# Patient Record
Sex: Female | Born: 2000
Health system: Southern US, Community
[De-identification: ages and names within clinical notes are randomized; demographics above are authoritative.]

## PROBLEM LIST (undated history)

## (undated) DIAGNOSIS — K219 Gastro-esophageal reflux disease without esophagitis: Secondary | ICD-10-CM

## (undated) DIAGNOSIS — D561 Beta thalassemia: Secondary | ICD-10-CM

## (undated) DIAGNOSIS — T783XXA Angioneurotic edema, initial encounter: Secondary | ICD-10-CM

## (undated) DIAGNOSIS — F99 Mental disorder, not otherwise specified: Secondary | ICD-10-CM

## (undated) DIAGNOSIS — K589 Irritable bowel syndrome without diarrhea: Secondary | ICD-10-CM

## (undated) DIAGNOSIS — J45909 Unspecified asthma, uncomplicated: Secondary | ICD-10-CM

## (undated) DIAGNOSIS — IMO0001 Reserved for inherently not codable concepts without codable children: Secondary | ICD-10-CM

## (undated) DIAGNOSIS — T7840XA Allergy, unspecified, initial encounter: Secondary | ICD-10-CM

## (undated) DIAGNOSIS — L509 Urticaria, unspecified: Secondary | ICD-10-CM

## (undated) DIAGNOSIS — L309 Dermatitis, unspecified: Secondary | ICD-10-CM

## (undated) DIAGNOSIS — D563 Thalassemia minor: Secondary | ICD-10-CM

## (undated) DIAGNOSIS — G43909 Migraine, unspecified, not intractable, without status migrainosus: Secondary | ICD-10-CM

## (undated) DIAGNOSIS — F419 Anxiety disorder, unspecified: Secondary | ICD-10-CM

## (undated) HISTORY — PX: CYST REMOVAL HAND: SHX6279

## (undated) HISTORY — DX: Anxiety disorder, unspecified: F41.9

## (undated) HISTORY — DX: Beta thalassemia: D56.1

## (undated) HISTORY — DX: Urticaria, unspecified: L50.9

## (undated) HISTORY — DX: Gastro-esophageal reflux disease without esophagitis: K21.9

## (undated) HISTORY — DX: Migraine, unspecified, not intractable, without status migrainosus: G43.909

## (undated) HISTORY — DX: Thalassemia minor: D56.3

## (undated) HISTORY — DX: Reserved for inherently not codable concepts without codable children: IMO0001

## (undated) HISTORY — DX: Angioneurotic edema, initial encounter: T78.3XXA

## (undated) HISTORY — DX: Mental disorder, not otherwise specified: F99

## (undated) HISTORY — DX: Allergy, unspecified, initial encounter: T78.40XA

## (undated) HISTORY — DX: Unspecified asthma, uncomplicated: J45.909

## (undated) HISTORY — DX: Dermatitis, unspecified: L30.9

---
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
## 2018-01-05 ENCOUNTER — Ambulatory Visit (HOSPITAL_COMMUNITY): Payer: BLUE CROSS/BLUE SHIELD | Admitting: Licensed Clinical Social Worker

## 2018-01-05 ENCOUNTER — Encounter (HOSPITAL_COMMUNITY): Payer: Self-pay | Admitting: Licensed Clinical Social Worker

## 2018-01-05 DIAGNOSIS — F411 Generalized anxiety disorder: Secondary | ICD-10-CM | POA: Diagnosis not present

## 2018-01-05 NOTE — Progress Notes (Signed)
Comprehensive Clinical Assessment (CCA) Note  01/05/2018 Renee Savage [id number]  Visit Diagnosis:      ICD-10-CM   1. Generalized anxiety disorder F41.1       CCA Part One  Part One has been completed on paper by the patient.  (See scanned document in Chart Review)  CCA Part Two A  Intake/Chief Complaint:  CCA Intake With Chief Complaint CCA Part Two Date: 01/05/18 CCA Part Two Time: 0819 Chief Complaint/Presenting Problem: Anxiety Patients Currently Reported Symptoms/Problems: Mood: Some difficulty with focus, overwhelmed with thoughts at times, irritable with family members (mother and father), wakes up through the night, episodes of tearfulness, feels overwhelmed,  Anxiety: worried, nervous, fearful, history of panic attacks,  worries about her knee cap, punishes herself when she feels like she has messed up  Collateral Involvement: None Individual's Strengths: Doctor, hospital, good at teaching, mediator at home, good speaking voice Individual's Preferences: Prefer independence/freedom, Prefers to talk/work out her problems, Prefers to spend time with dogs, Doesn't prefer when family overwhelms her, Doesn't prefer when her mother guilts her Individual's Abilities: Writing, communication, teaching Type of Services Patient Feels Are Needed: Therapy, Medication management Initial Clinical Notes/Concerns: Symptoms started around age 10 when she was in middle school when she was bullied, symptoms occur 4-7 days out of the week, symptoms are moderate to severe   Mental Health Symptoms Depression:  Depression: Tearfulness  Mania:     Anxiety:   Anxiety: Difficulty concentrating, Irritability, Sleep, Worrying, Tension, Restlessness, Fatigue  Psychosis:  Psychosis: N/A  Trauma:  Trauma: N/A  Obsessions:  Obsessions: N/A  Compulsions:  Compulsions: N/A  Inattention:  Inattention: N/A  Hyperactivity/Impulsivity:  Hyperactivity/Impulsivity: N/A  Oppositional/Defiant Behaviors:   Oppositional/Defiant Behaviors: N/A  Borderline Personality:  Emotional Irregularity: N/A  Other Mood/Personality Symptoms:   N/A   Mental Status Exam Appearance and self-care  Stature:  Stature: Average  Weight:  Weight: Thin  Clothing:  Clothing: Casual  Grooming:  Grooming: Well-groomed  Cosmetic use:  Cosmetic Use: Age appropriate  Posture/gait:  Posture/Gait: Normal  Motor activity:  Motor Activity: Not Remarkable  Sensorium  Attention:  Attention: Normal  Concentration:  Concentration: Normal  Orientation:  Orientation: X5  Recall/memory:  Recall/Memory: Normal  Affect and Mood  Affect:  Affect: Anxious  Mood:  Mood: Anxious  Relating  Eye contact:  Eye Contact: Fleeting  Facial expression:  Facial Expression: Anxious  Attitude toward examiner:  Attitude Toward Examiner: Cooperative  Thought and Language  Speech flow: Speech Flow: Pressured  Thought content:  Thought Content: Appropriate to mood and circumstances  Preoccupation:  Preoccupations: (None)  Hallucinations:  Hallucinations: (None)  Organization:   Logical   Company secretary of Knowledge:  Fund of Knowledge: Average  Intelligence:  Intelligence: Average  Abstraction:  Abstraction: Normal  Judgement:  Judgement: Normal  Reality Testing:  Reality Testing: Adequate  Insight:  Insight: Good  Decision Making:  Decision Making: Normal  Social Functioning  Social Maturity:  Social Maturity: Responsible  Social Judgement:  Social Judgement: Normal  Stress  Stressors:  Stressors: Transitions  Coping Ability:  Coping Ability: Building surveyor Deficits:   Anxiety, stress  Supports:   Family    Family and Psychosocial History: Family history Marital status: Single Are you sexually active?: No What is your sexual orientation?: A sexual  Has your sexual activity been affected by drugs, alcohol, medication, or emotional stress?: None  Does patient have children?: No  Childhood History:  Childhood  History By whom was/is  the patient raised?: Both parents Additional childhood history information: Patient describes her childhood as good but stressful Description of patient's relationship with caregiver when they were a child: Mother: Good but overprotective,    Father: Ok  Patient's description of current relationship with people who raised him/her: Mother: Close but overprotective   Father: Ok relationship, he is controlling How were you disciplined when you got in trouble as a child/adolescent?: Spanked, things taken away  Does patient have siblings?: Yes Number of Siblings: 1 Description of patient's current relationship with siblings: Brother, close  Did patient suffer any verbal/emotional/physical/sexual abuse as a child?: No Did patient suffer from severe childhood neglect?: No Has patient ever been sexually abused/assaulted/raped as an adolescent or adult?: No Was the patient ever a victim of a crime or a disaster?: No Witnessed domestic violence?: No Has patient been effected by domestic violence as an adult?: No  CCA Part Two B  Employment/Work Situation: Employment / Work Psychologist, occupational Employment situation: Tax inspector is the longest time patient has a held a job?: N/A Where was the patient employed at that time?: N/A Did You Receive Any Psychiatric Treatment/Services While in Equities trader?: No Are There Guns or Other Weapons in Your Home?: No Are These Comptroller?: No  Education: Engineer, civil (consulting) Currently Attending: [employer] Last Grade Completed: 12 Name of High School: [employer]  Did Garment/textile technologist From [employer]?: Yes Did Theme park manager?: Yes What Type of College Degree Do you Have?: Pursuing a Chief Operating Officer  What Was Your Major?: English then Education Did You Have Any Scientist, research (life sciences) In School?: Theater, dance, creative writing  Did You Have An Individualized Education Program (IIEP): No Did You Have Any Difficulty At School?:  No  Religion: Religion/Spirituality Are You A Religious Person?: Yes What is Your Religious Affiliation?: Christian How Might This Affect Treatment?: Support in treatment  Leisure/Recreation: Leisure / Recreation Leisure and Hobbies: Writing, drawing  Exercise/Diet: Exercise/Diet Do You Exercise?: No Have You Gained or Lost A Significant Amount of Weight in the Past Six Months?: No Do You Follow a Special Diet?: No Do You Have Any Trouble Sleeping?: Yes Explanation of Sleeping Difficulties: Thoughts   CCA Part Two C  Alcohol/Drug Use: Alcohol / Drug Use Pain Medications: See Patient MAR Prescriptions: See patient MAR Over the Counter: See patient MAR History of alcohol / drug use?: No history of alcohol / drug abuse                      CCA Part Three  ASAM's:  Six Dimensions of Multidimensional Assessment  Dimension 1:  Acute Intoxication and/or Withdrawal Potential:  Dimension 1:  Comments: None  Dimension 2:  Biomedical Conditions and Complications:  Dimension 2:  Comments: None  Dimension 3:  Emotional, Behavioral, or Cognitive Conditions and Complications:  Dimension 3:  Comments: None  Dimension 4:  Readiness to Change:  Dimension 4:  Comments: None  Dimension 5:  Relapse, Continued use, or Continued Problem Potential:  Dimension 5:  Comments: None  Dimension 6:  Recovery/Living Environment:  Dimension 6:  Recovery/Living Environment Comments: None    Substance use Disorder (SUD)    Social Function:  Social Functioning Social Maturity: Responsible Social Judgement: Normal  Stress:  Stress Stressors: Transitions Coping Ability: Overwhelmed Patient Takes Medications The Way The Doctor Instructed?: Yes Priority Risk: Low Acuity  Risk Assessment- Self-Harm Potential: Risk Assessment For Self-Harm Potential Thoughts of Self-Harm: No current thoughts Method: No plan Availability of  Means: No access/NA  Risk Assessment -Dangerous to Others  Potential: Risk Assessment For Dangerous to Others Potential Method: No Plan Availability of Means: No access or NA Intent: Vague intent or NA Notification Required: No need or identified person  DSM5 Diagnoses: Patient Active Problem List   Diagnosis Date Noted  . Generalized anxiety disorder 11/09/2017  . Chronic abdominal pain 10/16/2017    Patient Centered Plan: Patient is on the following Treatment Plan(s):  Anxiety  Recommendations for Services/Supports/Treatments: Recommendations for Services/Supports/Treatments Recommendations For Services/Supports/Treatments: Individual Therapy, Medication Management  Treatment Plan Summary:  Treatment plan will be completed during next session related to her anxiety.   Referrals to Alternative Service(s): Referred to Alternative Service(s):   Place:   Date:   Time:    Referred to Alternative Service(s):   Place:   Date:   Time:    Referred to Alternative Service(s):   Place:   Date:   Time:    Referred to Alternative Service(s):   Place:   Date:   Time:     Bynum Bellows, LCSW

## 2018-01-11 ENCOUNTER — Encounter (HOSPITAL_COMMUNITY): Payer: Self-pay | Admitting: Psychiatry

## 2018-01-11 ENCOUNTER — Ambulatory Visit (INDEPENDENT_AMBULATORY_CARE_PROVIDER_SITE_OTHER): Payer: BLUE CROSS/BLUE SHIELD | Admitting: Psychiatry

## 2018-01-11 VITALS — BP 100/68 | HR 86 | Ht 62.0 in | Wt 102.0 lb

## 2018-01-11 DIAGNOSIS — F411 Generalized anxiety disorder: Secondary | ICD-10-CM

## 2018-01-11 MED ORDER — ESCITALOPRAM OXALATE 20 MG PO TABS: 20.0000 mg | ORAL_TABLET | Freq: Every day | ORAL | 2 refills | Status: DC

## 2018-01-11 NOTE — Progress Notes (Signed)
BH MD/PA/NP OP Progress Note  01/11/2018 1:33 PM Renee Savage  MRN:  [id number]  Chief Complaint:  Chief Complaint    Depression; Anxiety; Follow-up     HPI: This patient is a 17 year old single white female who lives at home with her parents and 90 year old brother. 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  The patient was referred by her primary physician, [name] for further assessment and treatment of anxiety.  The patient states that she has been anxious for a number of years. Some of this started in middle school because she was being bullied. She also has a very contentious relationship with her father. She states that he tries to control everyone in the family but particularly her. They are always butting heads. She states that he comes from a "military family "and tends to be harsh. He is very critical of her appearance, her relationships, her major in school. He is also constantly criticizing little things like the timing of when she pays her tuition, how she does things around the house etc. By her report he is very harsh and judgmental. He pushed her constantly to go into the same feel that he is-speech therapy even though she does not want to. He is very against her living on campus because of the cost.  The patient states over the last few months she has been extremely irritable, crying at the drop of a hat anxious and worried. She tends to obsess about things and expect the worst. She is very self-critical. She was dating a boy last fall and he broke up with her in December and this caused some of the symptoms to intensify. She has never had previous psychiatric treatment because her father does not believe in mental health treatment. She does have a lot of GI issues such as stomachaches and diarrhea which Dr. [name] thinks are related to stress. She is not  able to sleep well and is constantly wakes up through the night. She has had some panic attacks particularly during arguments with dad. She denies being suicidal or engaging in any form of self-harm. She does not use alcohol drugs cigarettes or vaping. She is never been sexually active. She and her mother are very close and she is also close to her brother. She seems extremely managed in the family, trying to protect her brother from the father and also trying to make up for her mother's chronic headaches by being a "second mom"to brother who picks him up at school etc.  The patient returns after 4 weeks.  She is now on Lexapro 20 mg daily.  She claims she is somewhat less anxious but is not speaking in a very tangential way about all the issues she is having with her family.  She seems to be handling them somewhat better.  She still not very good at setting limits but is in therapy here.  She states that her mother is always asking her to do things that she does not feel up to doing.  She states that her brother is better at setting limits and she is.  She is constantly having diarrhea and are trying to figure out why.  She was found not to have gluten sensitivity.  She thinks it may be due to stress.  She has stopped taking Zantac due to recent recalls but has not substituted something else and I suggested  she call Dr. [name] about this. Visit Diagnosis:    ICD-10-CM   1. Generalized anxiety disorder F41.1     Past Psychiatric History: none  Past Medical History:  Past Medical History:  Diagnosis Date  . Allergy    Chronic  . Angio-edema   . Anxiety   . Asthma   . Beta thalassemia (HCC)   . Eczema   . IBS (irritable bowel syndrome)   . Reflux   . Urticaria     Past Surgical History:  Procedure Laterality Date  . CYST REMOVAL HAND    . WISDOM TOOTH EXTRACTION  2016    Family Psychiatric History: See below  Family History:  Family History  Problem Relation Age of Onset  .  Anxiety disorder Mother   . Depression Mother   . Depression Father   . Anxiety disorder Brother   . Depression Brother   . Depression Paternal Aunt   . Bipolar disorder Other     Social History:  Social History   Socioeconomic History  . Marital status: Single    Spouse name: Not on file  . Number of children: Not on file  . Years of education: Not on file  . Highest education level: Not on file  Occupational History  . Not on file  Social Needs  . Financial resource strain: Not on file  . Food insecurity:    Worry: Not on file    Inability: Not on file  . Transportation needs:    Medical: Not on file    Non-medical: Not on file  Tobacco Use  . Smoking status: Never Smoker  . Smokeless tobacco: Never Used  Substance and Sexual Activity  . Alcohol use: No  . Drug use: No  . Sexual activity: Never  Lifestyle  . Physical activity:    Days per week: Not on file    Minutes per session: Not on file  . Stress: Not on file  Relationships  . Social connections:    Talks on phone: Not on file    Gets together: Not on file    Attends religious service: Not on file    Active member of club or organization: Not on file    Attends meetings of clubs or organizations: Not on file    Relationship status: Not on file  Other Topics Concern  . Not on file  Social History Narrative  . Not on file    Allergies:  Allergies  Allergen Reactions  . Cefzil [Cefprozil] Nausea And Vomiting    Vomiting (as an baby)  . Gluten Meal   . Trimox [Amoxicillin] Nausea And Vomiting    Vomiting (as an baby)    Metabolic Disorder Labs: No results found for: HGBA1C, MPG No results found for: PROLACTIN No results found for: CHOL, TRIG, HDL, CHOLHDL, VLDL, LDLCALC No results found for: TSH  Therapeutic Level Labs: No results found for: LITHIUM No results found for: VALPROATE No components found for:  CBMZ  Current Medications: Current Outpatient Medications  Medication Sig Dispense  Refill  . diphenhydrAMINE HCl (BENADRYL ALLERGY PO) Take by mouth.    . escitalopram (LEXAPRO) 20 MG tablet Take 1 tablet (20 mg total) by mouth daily. 30 tablet 2  . Fexofenadine HCl (ALLEGRA ALLERGY PO) Take by mouth.    . fluticasone (FLONASE) 50 MCG/ACT nasal spray Place into both nostrils daily.    . Melatonin 10 MG TABS Take by mouth at bedtime as needed.    . montelukast (SINGULAIR)  10 MG tablet Take 1 tablet (10 mg total) by mouth at bedtime. 30 tablet 5  . Multiple Vitamin (MULTIVITAMIN) tablet Take 2 tablets by mouth at bedtime.     . Norethindrone-Ethinyl Estradiol-Fe Biphas (LO LOESTRIN FE) 1 MG-10 MCG / 10 MCG tablet Take 1 tablet by mouth daily. 1 Package 11  . Probiotic Product (PROBIOTIC DAILY PO) Take by mouth.    . ranitidine (ZANTAC) 150 MG tablet Take 1 tablet (150 mg total) by mouth 2 (two) times daily. For 14 days 28 tablet 0   No current facility-administered medications for this visit.      Musculoskeletal: Strength & Muscle Tone: within normal limits Gait & Station: normal Patient leans: N/A  Psychiatric Specialty Exam: Review of Systems  Gastrointestinal: Positive for diarrhea and heartburn.  Psychiatric/Behavioral: The patient is nervous/anxious.   All other systems reviewed and are negative.   Blood pressure 100/68, pulse 86, height 5\' 2"  (1.575 m), weight 102 lb (46.3 kg), SpO2 100 %.Body mass index is 18.66 kg/m.  General Appearance: Casual and Fairly Groomed  Eye Contact:  Good  Speech:  Clear and Coherent  Volume:  Normal  Mood:  Anxious  Affect:  Congruent and Full Range  Thought Process:  Goal Directed  Orientation:  Full (Time, Place, and Person)  Thought Content: Rumination   Suicidal Thoughts:  No  Homicidal Thoughts:  No  Memory:  Immediate;   Good Recent;   Good Remote;   Good  Judgement:  Fair  Insight:  Lacking  Psychomotor Activity:  Restlessness  Concentration:  Concentration: Good and Attention Span: Good  Recall:  Good  Fund  of Knowledge: Good  Language: Good  Akathisia:  No  Handed:  Right  AIMS (if indicated): not done  Assets:  Communication Skills Desire for Improvement Physical Health Resilience Social Support Talents/Skills  ADL's:  Intact  Cognition: WNL  Sleep:  Fair   Screenings: GAD-7     Office Visit from 10/16/2017 in [facility]  Total GAD-7 Score  16       Assessment and Plan: Patient is a 17 year old female with a history of generalized anxiety disorder.  She is still somewhat anxious but seems to be doing a bit better and is more animated and talkative.  She will continue Lexapro 20 mg daily and continue her therapy here.  She will return to see me in 6 weeks   [name], MD 01/11/2018, 1:33 PM

## 2018-01-13 ENCOUNTER — Ambulatory Visit (HOSPITAL_COMMUNITY): Payer: BLUE CROSS/BLUE SHIELD | Admitting: Licensed Clinical Social Worker

## 2018-01-13 DIAGNOSIS — F411 Generalized anxiety disorder: Secondary | ICD-10-CM | POA: Diagnosis not present

## 2018-01-14 ENCOUNTER — Encounter (HOSPITAL_COMMUNITY): Payer: Self-pay | Admitting: Licensed Clinical Social Worker

## 2018-01-14 NOTE — Progress Notes (Addendum)
   THERAPIST PROGRESS NOTE  Session Time: 1:00 pm- 2:00 pm  Participation Level: Active  Behavioral Response: CasualAlertAnxious  Type of Therapy: Individual Therapy  Treatment Goals addressed: Coping  Interventions: CBT and Solution Focused  Summary: Renee Savage is a 17 y.o. female who presents oriented x5 (person, place, situation, time, and object), casually dressed, appropriately groomed, average height, slender, and cooperative to address anxiety. Patient has a  History of medical treatment including diet for what was thought to be a gluten intolerance, GI issues and cyst on her hand. Patient has a history of mental health treatment including medication management. Patient denies suicidal and homicidal ideations. Patient denies substance abuse. Patient is at low risk for lethality at this time.  Patient has been having GI issues which she thinks is related to stress. She has 3 midterms at the end of the week in classes that she is no particularly passionate about. Patient feels like her parents forced her to switch majors because her brother said that her writing made him suicidal. Patient feels immense pressure from her parents. She is expected to make straight A's and maintain the house due to mother's toe surgery. Patient is responsible for making her 68 year old brother's lunch. She feels like her parents baby her brother and she is forced to baby him too. Patient writes her thoughts out on her phone and she records voice memos on her phone. Her parents played her voice memos and patient was embarrassed that they invaded her privacy. She went to [employer] and [employer] between her college classes and felt like this was empowering her because she didn't tell her mother who would have been upset that she went alone to an "unsafe" area. After discussion, patient understood that she needs to set small boundaries with her parents by telling them "no" and to take small steps to gain her  independence such as going places she wants and continuing to write even though her parents have told her no.  Patient engaged in session. She responded well to interventions. Patient continues to meet criteria for Generalized Anxiety Disorder. Patient will continue in outpatient therapy due to being the least restrictive service to meet her needs at this time. Patient made minimal progress on her goals.   Suicidal/Homicidal: Negativewithout intent/plan  Therapist Response: Therapist reviewed patient's recent thoughts and behaviors. Therapist utilized CBT to address anxiety. Therapist completed treatment plan with patient. Therapist processed patient's feelings to identify triggers for anxiety. Therapist helped patient identify ways to set boundaries and increase her independence to reduce feeling overwhelmed by her parents.   Plan: Return again in 3-4 weeks.  Diagnosis: Axis I: Generalized Anxiety Disorder    Axis II: No diagnosis    Bynum Bellows, LCSW 01/14/2018

## 2018-02-10 ENCOUNTER — Telehealth: Payer: Self-pay | Admitting: Family Medicine

## 2018-02-10 NOTE — Telephone Encounter (Signed)
Pt's school is having an outbreak of the mumps, pt would like to know if she is up to date on her shots. Advise.

## 2018-02-10 NOTE — Telephone Encounter (Signed)
Patient is aware she has had her mmr's.

## 2018-02-16 ENCOUNTER — Ambulatory Visit: Payer: BLUE CROSS/BLUE SHIELD | Admitting: Allergy & Immunology

## 2018-02-18 ENCOUNTER — Encounter: Payer: Self-pay | Admitting: Allergy & Immunology

## 2018-02-18 ENCOUNTER — Ambulatory Visit: Payer: Managed Care, Other (non HMO) | Admitting: Allergy & Immunology

## 2018-02-18 VITALS — BP 108/72 | HR 76 | Resp 16

## 2018-02-18 DIAGNOSIS — L501 Idiopathic urticaria: Secondary | ICD-10-CM

## 2018-02-18 DIAGNOSIS — F419 Anxiety disorder, unspecified: Secondary | ICD-10-CM | POA: Diagnosis not present

## 2018-02-18 NOTE — Patient Instructions (Addendum)
1. Chronic urticaria with angioedema (hives/swelling) - with negative workup - It seems that you have gotten to the root of your hive problems.  - Continue with antihistamines as needed.   2. Return if symptoms worsen or fail to improve.   Please inform us of any Emergency Department visits, hospitalizations, or changes in symptoms. Call us before going to the ED for breathing or allergy symptoms since we might be able to fit you in for a sick visit. Feel free to contact us anytime with any questions, problems, or concerns.  It was a pleasure to see you again today!  Websites that have reliable patient information: 1. American Academy of Asthma, Allergy, and Immunology: www.aaaai.org 2. Food Allergy Research and Education (FARE): foodallergy.org 3. Mothers of Asthmatics: http://www.asthmacommunitynetwork.org 4. American College of Allergy, Asthma, and Immunology: MissingWeapons.cawww.acaai.org   Make sure you are registered to vote! If you have moved or changed any of your contact information, you will need to get this updated before voting!

## 2018-02-18 NOTE — Progress Notes (Signed)
FOLLOW UP  Date of Service/Encounter:  02/18/18   Assessment:   Chronic idiopathic urticaria  Anxiety   Adverse social situation  Plan/Recommendations:   1. Chronic urticaria with angioedema (hives/swelling) - with negative workup - It seems that you have gotten to the root of your hive problems.  - Continue with antihistamines as needed.   2. Return if symptoms worsen or fail to improve.   Subjective:   Renee Savage is a 17 y.o. female presenting today for follow up of  Chief Complaint  Patient presents with  . Urticaria    doing better    Renee Savage has a history of the following: Patient Active Problem List   Diagnosis Date Noted  . Generalized anxiety disorder 11/09/2017  . Chronic abdominal pain 10/16/2017    History obtained from: chart review and patient.  Renee Savage's Primary Care Provider is Mikey Kirschner, MD.     Renee Savage is a 17 y.o. female presenting for a follow up visit.  She was last seen in August 2019 for an evaluation of chronic urticaria with angioedema.  We did get some labs to rule out serious causes of hives and swelling.  The only thing that was positive for the H. pylori IgM, which evidently is not predictive of an H. pylori infection.  We did follow-up with a breath test which was negative. We started her on suppressive doses of antihistamines including Allegra two tablets daily and Zyrtec 66m daily and Singulair 154mdaily. We did discuss Xolair as a treatment option as well.  She did have equivocal testing to orange, but was otherwise negative to the remainder of the full food panel.  Since the last visit, she has done well. She actually tried tapering them off in September. She feels that her hives are related to anxiety and she is now on a daily medication for anxiety. She does have counseling as well. This has seemed to control her hives.    She thinks that the stomach pain is related to the anxiety as well. This  overall has become more  severe, but she thinks that she has been able to control it more with the anxiety medications.   She underwent quite a bit of stress over the summer. Her brother attempted to commit suicide over the summer and was hospitalized for a period of time. 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 Her father seems like a rather controlling individual from what Renee Savage with me today. He tells Renee Savage of the time that she needs to choose a different career where she can make money and find a husband. Apparently, her father is rather demeaning to all of his children as well as Bailea's mother. In fact, her parents blamed her writing for her brother's attempt at suicide. In response to that, they deleted all of her novels and writing that she had done. This occurred over the summer prior to when I first met her. However, at the last visit Renee Savage's mother was with her and she was not able to fully express herself.  Renee Savage takes care of things around the house, including cleaning and taking her brother and mother to places. Renee Savage's father works long hours as a spPsychologist, clinicalt [place] a sore subject  for her but she is finding her ways to deal with it.   Otherwise, there have been no changes to her past medical history, surgical history, family history, or social history.    Review of Systems: a 14-point review of systems is pertinent for what is mentioned in HPI.  Otherwise, all other systems were negative.  Constitutional: negative other than that listed in the HPI Eyes: negative other than that listed in the HPI Ears, nose, mouth, throat, and face: negative other than that listed in the HPI Respiratory: negative other than that listed in the HPI Cardiovascular:  negative other than that listed in the HPI Gastrointestinal: negative other than that listed in the HPI Genitourinary: negative other than that listed in the HPI Integument: negative other than that listed in the HPI Hematologic: negative other than that listed in the HPI Musculoskeletal: negative other than that listed in the HPI Neurological: negative other than that listed in the HPI Allergy/Immunologic: negative other than that listed in the HPI    Objective:   Blood pressure 108/72, pulse 76, resp. rate 16. There is no height or weight on file to calculate BMI.   Physical Exam:  General: Alert, interactive, in no acute distress. Very heated at times when she is talking about her family.  Eyes: No conjunctival injection bilaterally, no discharge on the right, no discharge on the left and no Horner-Trantas dots present. PERRL bilaterally. EOMI without pain. No photophobia.  Ears: Right TM pearly gray with normal light reflex, Left TM pearly gray with normal light reflex, Right TM intact without perforation and Left TM intact without perforation.  Nose/Throat: External nose within normal limits and septum midline. Turbinates edematous and pale with clear discharge. Posterior oropharynx erythematous without cobblestoning in the posterior oropharynx. Tonsils 2+ without exudates.  Tongue without thrush. Lungs: Clear to auscultation without wheezing, rhonchi or rales. No increased work of breathing. CV: Normal S1/S2. No murmurs. Capillary refill <2 seconds.  Skin: Warm and dry, without lesions or rashes. Neuro:   Grossly intact. No focal deficits appreciated. Responsive to questions.  Diagnostic studies: none      Renee Marvel, MD  [facility]

## 2018-02-23 ENCOUNTER — Encounter (HOSPITAL_COMMUNITY): Payer: Self-pay | Admitting: Psychiatry

## 2018-02-23 ENCOUNTER — Ambulatory Visit (INDEPENDENT_AMBULATORY_CARE_PROVIDER_SITE_OTHER): Payer: PRIVATE HEALTH INSURANCE | Admitting: Psychiatry

## 2018-02-23 VITALS — BP 109/78 | HR 87 | Ht 62.0 in | Wt 109.0 lb

## 2018-02-23 DIAGNOSIS — F411 Generalized anxiety disorder: Secondary | ICD-10-CM

## 2018-02-23 MED ORDER — ESCITALOPRAM OXALATE 20 MG PO TABS: 20.0000 mg | ORAL_TABLET | Freq: Every day | ORAL | 2 refills | Status: DC

## 2018-02-23 NOTE — Progress Notes (Signed)
BH MD/PA/NP OP Progress Note  02/23/2018 8:56 AM Renee BasemanCatherine A Savage  MRN:  [id number]  Chief Complaint:  Chief Complaint    Depression; Anxiety; Follow-up     HPI:  This patient is a 17 year old single white female who lives at home with her parents and 19 year old brother. 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  The patient was referred by her primary physician, Dr. Lilyan PuntScott Luking for further assessment and treatment of anxiety.  The patient states that she has been anxious for a number of years. Some of this started in middle school because she was being bullied. She also has a very contentious relationship with her father. She states that he tries to control everyone in the family but particularly her. They are always butting heads. She states that he comes from a "military family "and tends to be harsh. He is very critical of her appearance, her relationships, her major in school. He is also constantly criticizing little things like the timing of when she pays her tuition, how she does things around the house etc. By her report he is very harsh and judgmental. He pushed her constantly to go into the same feel that he is-speech therapy even though she does not want to. He is very against her living on campus because of the cost.  The patient states over the last few months she has been extremely irritable, crying at the drop of a hat anxious and worried. She tends to obsess about things and expect the worst. She is very self-critical. She was dating a boy last fall and he broke up with her in December and this caused some of the symptoms to intensify. She has never had previous psychiatric treatment because her father does not believe in mental health treatment. She does have a lot of GI issues such as stomachaches and diarrhea which Dr. Gerda DissLuking thinks are related to stress. She is not  able to sleep well and is constantly wakes up through the night. She has had some panic attacks particularly during arguments with dad. She denies being suicidal or engaging in any form of self-harm. She does not use alcohol drugs cigarettes or vaping. She is never been sexually active. She and her mother are very close and she is also close to her brother. She seems extremely managed in the family, trying to protect her brother from the father and also trying to make up for her mother's chronic headaches by being a "second mom"to brother who picks him up at school etc.  The patient returns after 1 month.  She is generally doing quite well on her courses.  She is nervous today because she has to do a presentation.  Overall however her anxiety has lessened and she is sleeping well.  Her energy is good and she denies suicidal ideation.  She is no longer having panic attacks.  She is very worried about telling her parents that she is about to publish the children's [employer] book.  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  I encouraged her to make her point to her parents as she is now an adult and is able to make her own decisions about such things.   ICD-10-CM   1. Generalized anxiety disorder F41.1     Past Psychiatric History:none  Past  Medical History:  Past Medical History:  Diagnosis Date  . Allergy    Chronic  . Angio-edema   . Anxiety   . Asthma   . Beta thalassemia (HCC)   . Eczema   . IBS (irritable bowel syndrome)   . Reflux   . Urticaria     Past Surgical History:  Procedure Laterality Date  . CYST REMOVAL HAND    . WISDOM TOOTH EXTRACTION  2016    Family Psychiatric History: See below  Family History:  Family History  Problem Relation Age of Onset  . Anxiety disorder Mother   . Depression Mother   . Depression Father   . Anxiety disorder Brother   . Depression Brother   . Depression  Paternal Aunt   . Bipolar disorder Other     Social History:  Social History   Socioeconomic History  . Marital status: Single    Spouse name: Not on file  . Number of children: Not on file  . Years of education: Not on file  . Highest education level: Not on file  Occupational History  . Not on file  Social Needs  . Financial resource strain: Not on file  . Food insecurity:    Worry: Not on file    Inability: Not on file  . Transportation needs:    Medical: Not on file    Non-medical: Not on file  Tobacco Use  . Smoking status: Never Smoker  . Smokeless tobacco: Never Used  Substance and Sexual Activity  . Alcohol use: No  . Drug use: No  . Sexual activity: Never  Lifestyle  . Physical activity:    Days per week: Not on file    Minutes per session: Not on file  . Stress: Not on file  Relationships  . Social connections:    Talks on phone: Not on file    Gets together: Not on file    Attends religious service: Not on file    Active member of club or organization: Not on file    Attends meetings of clubs or organizations: Not on file    Relationship status: Not on file  Other Topics Concern  . Not on file  Social History Narrative  . Not on file    Allergies:  Allergies  Allergen Reactions  . Cefzil [Cefprozil] Nausea And Vomiting    Vomiting (as an baby)  . Gluten Meal   . Trimox [Amoxicillin] Nausea And Vomiting    Vomiting (as an baby)    Metabolic Disorder Labs: No results found for: HGBA1C, MPG No results found for: PROLACTIN No results found for: CHOL, TRIG, HDL, CHOLHDL, VLDL, LDLCALC No results found for: TSH  Therapeutic Level Labs: No results found for: LITHIUM No results found for: VALPROATE No components found for:  CBMZ  Current Medications: Current Outpatient Medications  Medication Sig Dispense Refill  . diphenhydrAMINE HCl (BENADRYL ALLERGY PO) Take by mouth.    . escitalopram (LEXAPRO) 20 MG tablet Take 1 tablet (20 mg total)  by mouth daily. 30 tablet 2  . Fexofenadine HCl (ALLEGRA ALLERGY PO) Take by mouth.    . fluticasone (FLONASE) 50 MCG/ACT nasal spray Place into both nostrils daily.    . Melatonin 10 MG TABS Take by mouth at bedtime as needed.    . montelukast (SINGULAIR) 10 MG tablet Take 1 tablet (10 mg total) by mouth at bedtime. 30 tablet 5  . Multiple Vitamin (MULTIVITAMIN) tablet Take 2 tablets by mouth at bedtime.     Marland Kitchen  Norethindrone-Ethinyl Estradiol-Fe Biphas (LO LOESTRIN FE) 1 MG-10 MCG / 10 MCG tablet Take 1 tablet by mouth daily. 1 Package 11  . Probiotic Product (PROBIOTIC DAILY PO) Take by mouth.     No current facility-administered medications for this visit.      Musculoskeletal: Strength & Muscle Tone: within normal limits Gait & Station: normal Patient leans: N/A  Psychiatric Specialty Exam: Review of Systems  Psychiatric/Behavioral: The patient is nervous/anxious.   All other systems reviewed and are negative.   Blood pressure 109/78, pulse 87, height 5\' 2"  (1.575 m), weight 109 lb (49.4 kg), SpO2 100 %.Body mass index is 19.94 kg/m.  General Appearance: Casual and Fairly Groomed  Eye Contact:  Good  Speech:  Clear and Coherent  Volume:  Normal  Mood:  Anxious  Affect:  Appropriate and Congruent  Thought Process:  Goal Directed  Orientation:  Full (Time, Place, and Person)  Thought Content: Rumination   Suicidal Thoughts:  No  Homicidal Thoughts:  No  Memory:  Immediate;   Good Recent;   Good Remote;   Good  Judgement:  Good  Insight:  Fair  Psychomotor Activity:  Normal  Concentration:  Concentration: Good and Attention Span: Good  Recall:  Good  Fund of Knowledge: Good  Language: Good  Akathisia:  No  Handed:  Right  AIMS (if indicated): not done  Assets:  Communication Skills Desire for Improvement Physical Health Resilience Social Support Talents/Skills  ADL's:  Intact  Cognition: WNL  Sleep:  Good   Screenings: GAD-7     Office Visit from 10/16/2017  in [facility]  Total GAD-7 Score  16       Assessment and Plan: This patient is a 17 year old female with a history of anxiety.  She has a lot of demands and expectations placed on her by her family at least by her report.  She is slowly trying to extricate herself from these demands and to make her own way in the world.  She has benefited from Lexapro 20 mg daily as she is less anxious and no longer having frequent panic attacks and is sleeping better.  She will continue this medication, continue her counseling here and return to see me in 2 months   [name], MD 02/23/2018, 8:56 AM

## 2018-03-02 ENCOUNTER — Encounter

## 2018-03-02 ENCOUNTER — Ambulatory Visit (INDEPENDENT_AMBULATORY_CARE_PROVIDER_SITE_OTHER): Payer: PRIVATE HEALTH INSURANCE | Admitting: Licensed Clinical Social Worker

## 2018-03-02 ENCOUNTER — Encounter (HOSPITAL_COMMUNITY): Payer: Self-pay | Admitting: Licensed Clinical Social Worker

## 2018-03-02 DIAGNOSIS — F411 Generalized anxiety disorder: Secondary | ICD-10-CM | POA: Diagnosis not present

## 2018-03-02 NOTE — Progress Notes (Signed)
   THERAPIST PROGRESS NOTE  Session Time: 8:00 am- 9:00 am  Participation Level: Active  Behavioral Response: CasualAlertAnxious  Type of Therapy: Individual Therapy  Treatment Goals addressed: Coping  Interventions: CBT and Solution Focused  Summary: Renee Savage is a 17 y.o. female who presents oriented x5 (person, place, situation, time, and object), casually dressed, appropriately groomed, average height, slender, and cooperative to address anxiety. Patient has a  History of medical treatment including diet for what was thought to be a gluten intolerance, GI issues and cyst on her hand. Patient has a history of mental health treatment including medication management. Patient denies suicidal and homicidal ideations. Patient denies substance abuse. Patient is at low risk for lethality at this time.  Patient didn't report any physical issues. Patient continues to be spiritually healthy. She is concerned with her parents. They are arguing a lot and it is causing anxiety. Patient has been writing and struggling in school. She is managing better at school. Patient has an opportunity to publish her book and she is really excited. Patient is also really worried about telling her parents. They have forbid her from writing. She is worried they are going to delete her story, etc. Patient is planning on talking to her parents on a "good day" when they are not arguing or fighting.   Patient engaged in session. She responded well to interventions. Patient continues to meet criteria for Generalized Anxiety Disorder. Patient will continue in outpatient therapy due to being the least restrictive service to meet her needs at this time. Patient made minimal progress on her goals.   Suicidal/Homicidal: Negativewithout intent/plan  Therapist Response: Therapist reviewed patient's recent thoughts and behaviors. Therapist utilized CBT to address anxiety. Therapist completed treatment plan with patient.  Therapist processed patient's feelings to identify triggers for anxiety. Therapist discussed patient talking to her family about getting her book published and continuing to gain independence.   Plan: Return again in 3-4 weeks.  Diagnosis: Axis I: Generalized Anxiety Disorder    Axis II: No diagnosis    Bynum BellowsJoshua Jenavieve Freda, LCSW 03/02/2018

## 2018-04-02 ENCOUNTER — Ambulatory Visit: Payer: Managed Care, Other (non HMO) | Admitting: Family Medicine

## 2018-04-02 ENCOUNTER — Encounter: Payer: Self-pay | Admitting: Family Medicine

## 2018-04-02 VITALS — Temp 99.3°F | Wt 115.4 lb

## 2018-04-02 DIAGNOSIS — J019 Acute sinusitis, unspecified: Secondary | ICD-10-CM | POA: Diagnosis not present

## 2018-04-02 DIAGNOSIS — R062 Wheezing: Secondary | ICD-10-CM | POA: Diagnosis not present

## 2018-04-02 DIAGNOSIS — B9689 Other specified bacterial agents as the cause of diseases classified elsewhere: Secondary | ICD-10-CM | POA: Diagnosis not present

## 2018-04-02 MED ORDER — DOXYCYCLINE HYCLATE 100 MG PO CAPS: 100.0000 mg | ORAL_CAPSULE | Freq: Two times a day (BID) | ORAL | 0 refills | Status: DC

## 2018-04-02 MED ORDER — ALBUTEROL SULFATE HFA 108 (90 BASE) MCG/ACT IN AERS: 2.0000 | INHALATION_SPRAY | Freq: Four times a day (QID) | RESPIRATORY_TRACT | 2 refills | Status: AC | PRN

## 2018-04-02 NOTE — Progress Notes (Signed)
   Subjective:    Patient ID: Renee Savage, female    DOB: [birth year], 17 y.o.   MRN: [id number]  Cough  This is a new problem. The current episode started in the past 7 days. Associated symptoms include a fever, rhinorrhea and wheezing. Pertinent negatives include no chest pain, ear pain or shortness of breath. Associated symptoms comments: Off and off fever started yesterday. Treatments tried: [place] Elderburry, Mucinex DM, Mucinex. The treatment provided no relief.   Under some stress in school because she had a B- in a class but otherwise doing well in school   Review of Systems  Constitutional: Positive for fever. Negative for activity change.  HENT: Positive for congestion and rhinorrhea. Negative for ear pain.   Eyes: Negative for discharge.  Respiratory: Positive for cough and wheezing. Negative for shortness of breath.   Cardiovascular: Negative for chest pain.   Experiences an occasional wheeze    Objective:   Physical Exam Vitals signs and nursing note reviewed.  Constitutional:      Appearance: She is well-developed.  HENT:     Head: Normocephalic.     Nose: Nose normal.     Mouth/Throat:     Pharynx: No oropharyngeal exudate.  Neck:     Musculoskeletal: Neck supple.  Cardiovascular:     Rate and Rhythm: Normal rate.     Heart sounds: Normal heart sounds. No murmur.  Pulmonary:     Effort: Pulmonary effort is normal.     Breath sounds: Normal breath sounds. No wheezing.  Lymphadenopathy:     Cervical: No cervical adenopathy.  Skin:    General: Skin is warm and dry.    Patient with frontal sinus tenderness Patient had a bloody nose but I do not see any active bleeding currently      Assessment & Plan:  Patient stressed about not doing well at school but denies being depressed we did talk about measures of trying her best but at the same time allowing herself some leeway in her approach  Secondary sinusitis antibiotic prescribed warning signs discussed  follow-up if progressive troubles or worse  Albuterol PRN no prednisone necessary currently

## 2018-04-09 ENCOUNTER — Other Ambulatory Visit (HOSPITAL_COMMUNITY): Payer: Self-pay | Admitting: Psychiatry

## 2018-04-09 MED ORDER — ESCITALOPRAM OXALATE 20 MG PO TABS: 20.0000 mg | ORAL_TABLET | Freq: Every day | ORAL | 2 refills | Status: DC

## 2018-04-20 ENCOUNTER — Encounter: Payer: Self-pay | Admitting: Family Medicine

## 2018-04-20 ENCOUNTER — Ambulatory Visit: Payer: Managed Care, Other (non HMO) | Admitting: Family Medicine

## 2018-04-20 VITALS — BP 112/80 | Temp 98.7°F | Wt 118.0 lb

## 2018-04-20 DIAGNOSIS — J019 Acute sinusitis, unspecified: Secondary | ICD-10-CM | POA: Diagnosis not present

## 2018-04-20 MED ORDER — SULFAMETHOXAZOLE-TRIMETHOPRIM 800-160 MG PO TABS: 1.0000 | ORAL_TABLET | Freq: Two times a day (BID) | ORAL | 0 refills | Status: AC

## 2018-04-20 NOTE — Progress Notes (Signed)
   Subjective:    Patient ID: Renee Savage, female    DOB: Aug 05, 2000, 18 y.o.   MRN: [id number]  HPI  Patient is here today with complaints of a cough,runny nose,fever,fatigue,swollen glands. She says this all began Dec 25,2019. She states she has taken mucinex,pineapple juice. Was seen Dec 27,2019 and was given doxycycline. She state it helped,but she has relapsed.  Felt a little better after abx for about 3 days still had cough but noticed it was less severe and having less nasal drainage, started back with worsening symptoms on 1/9 with more congestion, cough productive of green/yellow mucous, low grade fever. Reports feeling very tired. Bodyaches, especially around her neck and legs.    Review of Systems  Constitutional: Positive for chills, fatigue and fever.  HENT: Positive for congestion, rhinorrhea and sore throat. Negative for ear pain.   Respiratory: Positive for cough. Negative for shortness of breath and wheezing.   Musculoskeletal: Positive for myalgias.       Objective:   Physical Exam Vitals signs and nursing note reviewed.  Constitutional:      General: She is not in acute distress.    Appearance: Normal appearance. She is not toxic-appearing.  HENT:     Head: Normocephalic and atraumatic.     Right Ear: Tympanic membrane normal.     Left Ear: Tympanic membrane normal.     Nose: Nasal tenderness and congestion present.     Mouth/Throat:     Mouth: Mucous membranes are moist.     Pharynx: Oropharynx is clear.  Eyes:     General:        Right eye: No discharge.        Left eye: No discharge.  Neck:     Musculoskeletal: Neck supple. No neck rigidity.  Cardiovascular:     Rate and Rhythm: Normal rate and regular rhythm.     Heart sounds: Normal heart sounds.  Pulmonary:     Effort: Pulmonary effort is normal. No respiratory distress.     Breath sounds: Normal breath sounds. No wheezing or rales.  Lymphadenopathy:     Cervical: No cervical adenopathy.    Skin:    General: Skin is warm and dry.  Neurological:     Mental Status: She is alert and oriented to person, place, and time.           Assessment & Plan:  Acute rhinosinusitis  Discussed with patient likely post flu secondary bacterial infection.  Will treat with different antibiotic than before.  Symptomatic care discussed.  Warning signs discussed.  Follow-up if symptoms worsen or fail to improve.  Dr. Lubertha South was consulted on this case and is in agreement with the above treatment plan.

## 2018-04-21 ENCOUNTER — Ambulatory Visit (HOSPITAL_COMMUNITY): Payer: PRIVATE HEALTH INSURANCE | Admitting: Licensed Clinical Social Worker

## 2018-04-23 ENCOUNTER — Other Ambulatory Visit (HOSPITAL_COMMUNITY): Payer: Self-pay | Admitting: Psychiatry

## 2018-04-26 ENCOUNTER — Encounter (HOSPITAL_COMMUNITY): Payer: Self-pay | Admitting: Psychiatry

## 2018-04-26 ENCOUNTER — Ambulatory Visit (HOSPITAL_COMMUNITY): Payer: PRIVATE HEALTH INSURANCE | Admitting: Psychiatry

## 2018-04-26 VITALS — BP 106/74 | HR 103 | Ht 62.0 in | Wt 116.0 lb

## 2018-04-26 DIAGNOSIS — F411 Generalized anxiety disorder: Secondary | ICD-10-CM | POA: Diagnosis not present

## 2018-04-26 MED ORDER — ESCITALOPRAM OXALATE 20 MG PO TABS: 20.0000 mg | ORAL_TABLET | Freq: Every day | ORAL | 2 refills | Status: DC

## 2018-04-26 NOTE — Progress Notes (Signed)
BH MD/PA/NP OP Progress Note  04/26/2018 10:01 AM Renee Savage  MRN:  [id number]  Chief Complaint:  Chief Complaint    Anxiety; Follow-up     HPI: This patient is a 18 year old single white female who lives at home with her parents and 41 year old brother. 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  The patient was referred by her primary physician, [name] for further assessment and treatment of anxiety.  The patient states that she has been anxious for a number of years. Some of this started in middle school because she was being bullied. She also has a very contentious relationship with her father. She states that he tries to control everyone in the family but particularly her. They are always butting heads. She states that he comes from a "military family "and tends to be harsh. He is very critical of her appearance, her relationships, her major in school. He is also constantly criticizing little things like the timing of when she pays her tuition, how she does things around the house etc. By her report he is very harsh and judgmental. He pushed her constantly to go into the same feel that he is-speech therapy even though she does not want to. He is very against her living on campus because of the cost.  The patient states over the last few months she has been extremely irritable, crying at the drop of a hat anxious and worried. She tends to obsess about things and expect the worst. She is very self-critical. She was dating a boy last fall and he broke up with her in December and this caused some of the symptoms to intensify. She has never had previous psychiatric treatment because her father does not believe in mental health treatment. She does have a lot of GI issues such as stomachaches and diarrhea which Dr. [name] thinks are related to stress. She is not able to sleep  well and is constantly wakes up through the night. She has had some panic attacks particularly during arguments with dad. She denies being suicidal or engaging in any form of self-harm. She does not use alcohol drugs cigarettes or vaping. She is never been sexually active. She and her mother are very close and she is also close to her brother. She seems extremely managed in the family, trying to protect her brother from the father and also trying to make up for her mother's chronic headaches by being a "second mom"to brother who picks him up at school etc.  The patient returns after 2 months.  She states that she is generally been doing well.  She made up her mind to stay as an [place] major and not get into teaching.  This was despite what her parents want her to do.  She is standing up for herself a little more.  She was able to tell her parents that she is working on a book even though they did not want her to.  They seem to be okay with it.  From her report her father is still somewhat critical but she is taking it better.  She has been sick with the flu and bronchitis.  She is just now starting to get better.  She still thinks the Lexapro has been helpful with her mood and anxiety.  She denies suicidal ideation Visit Diagnosis:    ICD-10-CM   1. Generalized anxiety disorder F41.1  Past Psychiatric History: none  Past Medical History:  Past Medical History:  Diagnosis Date  . Allergy    Chronic  . Angio-edema   . Anxiety   . Asthma   . Beta thalassemia (HCC)   . Eczema   . IBS (irritable bowel syndrome)   . Reflux   . Urticaria     Past Surgical History:  Procedure Laterality Date  . CYST REMOVAL HAND    . WISDOM TOOTH EXTRACTION  2016    Family Psychiatric History: See below  Family History:  Family History  Problem Relation Age of Onset  . Anxiety disorder Mother   . Depression Mother   . Depression Father   . Anxiety disorder Brother   . Depression Brother   .  Depression Paternal Aunt   . Bipolar disorder Other     Social History:  Social History   Socioeconomic History  . Marital status: Single    Spouse name: Not on file  . Number of children: Not on file  . Years of education: Not on file  . Highest education level: Not on file  Occupational History  . Not on file  Social Needs  . Financial resource strain: Not on file  . Food insecurity:    Worry: Not on file    Inability: Not on file  . Transportation needs:    Medical: Not on file    Non-medical: Not on file  Tobacco Use  . Smoking status: Never Smoker  . Smokeless tobacco: Never Used  Substance and Sexual Activity  . Alcohol use: No  . Drug use: No  . Sexual activity: Never  Lifestyle  . Physical activity:    Days per week: Not on file    Minutes per session: Not on file  . Stress: Not on file  Relationships  . Social connections:    Talks on phone: Not on file    Gets together: Not on file    Attends religious service: Not on file    Active member of club or organization: Not on file    Attends meetings of clubs or organizations: Not on file    Relationship status: Not on file  Other Topics Concern  . Not on file  Social History Narrative  . Not on file    Allergies:  Allergies  Allergen Reactions  . Cefzil [Cefprozil] Nausea And Vomiting    Vomiting (as an baby)  . Gluten Meal   . Trimox [Amoxicillin] Nausea And Vomiting    Vomiting (as an baby)    Metabolic Disorder Labs: No results found for: HGBA1C, MPG No results found for: PROLACTIN No results found for: CHOL, TRIG, HDL, CHOLHDL, VLDL, LDLCALC No results found for: TSH  Therapeutic Level Labs: No results found for: LITHIUM No results found for: VALPROATE No components found for:  CBMZ  Current Medications: Current Outpatient Medications  Medication Sig Dispense Refill  . albuterol (PROVENTIL HFA;VENTOLIN HFA) 108 (90 Base) MCG/ACT inhaler Inhale 2 puffs into the lungs every 6 (six)  hours as needed for wheezing. 1 Inhaler 2  . diphenhydrAMINE HCl (BENADRYL ALLERGY PO) Take by mouth.    . escitalopram (LEXAPRO) 20 MG tablet Take 1 tablet (20 mg total) by mouth daily. 90 tablet 2  . Melatonin 10 MG TABS Take by mouth at bedtime as needed.    . Multiple Vitamin (MULTIVITAMIN) tablet Take 2 tablets by mouth at bedtime.     . Norethindrone-Ethinyl Estradiol-Fe Biphas (LO LOESTRIN FE) 1 MG-10  MCG / 10 MCG tablet Take 1 tablet by mouth daily. 1 Package 11  . Probiotic Product (PROBIOTIC DAILY PO) Take by mouth.    . sulfamethoxazole-trimethoprim (BACTRIM DS,SEPTRA DS) 800-160 MG tablet Take 1 tablet by mouth 2 (two) times daily for 10 days. 20 tablet 0   No current facility-administered medications for this visit.      Musculoskeletal: Strength & Muscle Tone: within normal limits Gait & Station: normal Patient leans: N/A  Psychiatric Specialty Exam: Review of Systems  HENT: Positive for congestion.   All other systems reviewed and are negative.   Blood pressure 106/74, pulse (!) 103, height 5\' 2"  (1.575 m), weight 116 lb (52.6 kg), SpO2 98 %.Body mass index is 21.22 kg/m.  General Appearance: Casual and Fairly Groomed  Eye Contact:  Good  Speech:  Clear and Coherent  Volume:  Normal  Mood:  Euthymic  Affect:  Appropriate and Non-Congruent  Thought Process:  Goal Directed  Orientation:  Full (Time, Place, and Person)  Thought Content: Rumination   Suicidal Thoughts:  No  Homicidal Thoughts:  No  Memory:  Immediate;   Good Recent;   Good Remote;   Fair  Judgement:  Good  Insight:  Good  Psychomotor Activity:  Normal  Concentration:  Concentration: Good and Attention Span: Good  Recall:  Good  Fund of Knowledge: Good  Language: Good  Akathisia:  No  Handed:  Right  AIMS (if indicated): not done  Assets:  Communication Skills Desire for Improvement Physical Health Resilience Social Support Talents/Skills Vocational/Educational  ADL's:  Intact   Cognition: WNL  Sleep:  Good   Screenings: GAD-7     [facility] Visit from 10/16/2017 in [facility]  Total GAD-7 Score  16       Assessment and Plan: Patient is a 18 year old female with a history of depression but primarily anxiety.  She seems to be benefiting greatly from the therapy here and is standing up more for herself with her family.  She also seems to be deriving benefit from medication as she is less anxious and depressed.  She will continue Lexapro 20 mg daily and return to see me in 2 months   [name] Calden Dorsey, MD 04/26/2018, 10:01 AM

## 2018-05-12 ENCOUNTER — Encounter (HOSPITAL_COMMUNITY): Payer: Self-pay | Admitting: Licensed Clinical Social Worker

## 2018-05-12 ENCOUNTER — Other Ambulatory Visit: Payer: Self-pay | Admitting: Family Medicine

## 2018-05-12 ENCOUNTER — Telehealth: Payer: Self-pay | Admitting: *Deleted

## 2018-05-12 ENCOUNTER — Ambulatory Visit (HOSPITAL_COMMUNITY): Payer: PRIVATE HEALTH INSURANCE | Admitting: Licensed Clinical Social Worker

## 2018-05-12 DIAGNOSIS — F411 Generalized anxiety disorder: Secondary | ICD-10-CM | POA: Diagnosis not present

## 2018-05-12 MED ORDER — OSELTAMIVIR PHOSPHATE 75 MG PO CAPS: 75.0000 mg | ORAL_CAPSULE | Freq: Two times a day (BID) | ORAL | 0 refills | Status: AC

## 2018-05-12 NOTE — Telephone Encounter (Signed)
Patient advised per Lillia Abed: Likely flu. Tamiflu sent into [facility] [address]. If her symptoms worsen over the next several days she should f/u with an office visit. Recommend symptomatic care with humidifier, saline nasal spray, warm salt water gargles, may use motrin or tylenol for fever/body aches. OTC cough medicine like robitussin DM or Delsym is fine to use prn as well. If she develops severe shortness of breath or high fever she should be seen in ED. Ensure adequate hydration. Patient verbalized understanding.

## 2018-05-12 NOTE — Progress Notes (Signed)
   THERAPIST PROGRESS NOTE  Session Time: 9:00 am- 9:55 am  Participation Level: Active  Behavioral Response: CasualAlertAnxious  Type of Therapy: Individual Therapy  Treatment Goals addressed: Coping  Interventions: CBT and Solution Focused  Summary: Renee Savage is a 18 y.o. female who presents oriented x5 (person, place, situation, time, and object), casually dressed, appropriately groomed, average height, slender, and cooperative to address anxiety. Patient has a  History of medical treatment including diet for what was thought to be a gluten intolerance, GI issues and cyst on her hand. Patient has a history of mental health treatment including medication management. Patient denies suicidal and homicidal ideations. Patient denies substance abuse. Patient is at low risk for lethality at this time.  Patient has been sick on several occasions since Christmas. 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 Her mother was supportive and her father was immediately critical. Patient shared several occasions where her father got angry at her mother, or her brother or even her. Patient feels like she is the gatekeeper or the mediator with her family. She deals with the anxiety and her fathers anger by shutting down if it is really bad, she presents facts to her father to make him understand, or she is the Museum/gallery curator. Patient shared an voice recording that she made of her father yelling and arguing with her brother. Patient changed her major to something she wants to do. 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 Patient has not told her parents she has changed her major. Patient is going to wait until the right time.   Patient engaged in session. She responded well to interventions. Patient continues to meet criteria for Generalized Anxiety Disorder. Patient will continue in outpatient therapy due to being the least restrictive service to meet her needs at this  time. Patient made minimal progress on her goals.   Suicidal/Homicidal: Negativewithout intent/plan  Therapist Response: Therapist reviewed patient's recent thoughts and behaviors. Therapist utilized CBT to address anxiety. Therapist completed treatment plan with patient. Therapist processed patient's feelings to identify triggers for anxiety. Therapist discussed patient taking on responsibility in her family which adds pressure to her and increases her anxiety.   Plan: Return again in 3-4 weeks.  Diagnosis: Axis I: Generalized Anxiety Disorder    Axis II: No diagnosis    Bynum Bellows, LCSW 05/12/2018

## 2018-05-12 NOTE — Telephone Encounter (Signed)
Likely flu. Tamiflu sent into [facility] [address]. If her symptoms worsen over the next several days she should f/u with an office visit. Recommend symptomatic care with humidifier, saline nasal spray, warm salt water gargles, may use motrin or tylenol for fever/body aches. OTC cough medicine like robitussin DM or Delsym is fine to use prn as well. If she develops severe shortness of breath or high fever she should be seen in ED. Ensure adequate hydration.   Thanks!

## 2018-05-12 NOTE — Telephone Encounter (Signed)
Parents both diagnosed with flu today. Pt is having Fever, scratchy throat, cough, headache, very tired. Started 2 days.   [facility] [address]. Mother would like a call back after med is sent to pharm.

## 2018-05-26 ENCOUNTER — Other Ambulatory Visit: Payer: Self-pay | Admitting: Family Medicine

## 2018-05-26 ENCOUNTER — Telehealth: Payer: Self-pay | Admitting: Family Medicine

## 2018-05-26 MED ORDER — OSELTAMIVIR PHOSPHATE 75 MG PO CAPS: 75.0000 mg | ORAL_CAPSULE | Freq: Two times a day (BID) | ORAL | 0 refills | Status: DC

## 2018-05-26 NOTE — Telephone Encounter (Signed)
Nurses- when the mother was in earlier we stated we would send in Tamiflu for her daughter-please let family know that we did send in a prescription for Tamiflu to the [facility] [address] If Renee Savage has progressive symptoms related to the flu or getting worse we would recommend to be checked here at the office call us if any problems  (Mom stated that [name] had flulike symptoms over the past 24 hours mom and dad both diagnosed with flu [name] is currently in classes at [employer] [facility] and cannot come in today)

## 2018-05-26 NOTE — Telephone Encounter (Signed)
Mom is aware and verbalized understanding.

## 2018-05-26 NOTE — Telephone Encounter (Signed)
Mother in for office visit for flu today. Provider needing telephone msg opened up regarding daughter have flu like symptoms. Cough, fever, aches, tired and vomited once. [facility] on [address]

## 2018-06-10 ENCOUNTER — Encounter (HOSPITAL_COMMUNITY): Payer: Self-pay | Admitting: Licensed Clinical Social Worker

## 2018-06-10 ENCOUNTER — Ambulatory Visit (INDEPENDENT_AMBULATORY_CARE_PROVIDER_SITE_OTHER): Payer: PRIVATE HEALTH INSURANCE | Admitting: Licensed Clinical Social Worker

## 2018-06-10 DIAGNOSIS — F411 Generalized anxiety disorder: Secondary | ICD-10-CM

## 2018-06-10 NOTE — Progress Notes (Signed)
   THERAPIST PROGRESS NOTE  Session Time: 9:00 am- 9:55 am  Participation Level: Active  Behavioral Response: CasualAlertAnxious  Type of Therapy: Individual Therapy  Treatment Goals addressed: Coping  Interventions: CBT and Solution Focused  Summary: Renee Savage is a 18 y.o. female who presents oriented x5 (person, place, situation, time, and object), casually dressed, appropriately groomed, average height, slender, and cooperative to address anxiety. Patient has a  History of medical treatment including diet for what was thought to be a gluten intolerance, GI issues and cyst on her hand. Patient has a history of mental health treatment including medication management. Patient denies suicidal and homicidal ideations. Patient denies substance abuse. Patient is at low risk for lethality at this time.  Patient is getting over the flu. She notes that she has had it 3 times. She continues to be spiritually healthy. Patient has a strained relationship with her father. She is a parentified child and the scape goat for her family. She has responsibilities for maintaining the home thrust upon her by both her mother and father. She is more comfortable helping her mother due to her mother's health concerns. Patient father does not want to help her at all around the house. 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 She feels confident in her abilities and feels like she has support from her writing friends. Patient has anxiety related to her school work and her family. Patient was given 27 CBT cards and committed to do one daily to adjust her thoughts and actions.   Patient engaged in session. She responded well to interventions. Patient continues to meet criteria for Generalized Anxiety Disorder. Patient will continue in outpatient therapy due to being the least restrictive  service to meet her needs at this time. Patient made minimal progress on her goals.   Suicidal/Homicidal: Negativewithout intent/plan  Therapist Response: Therapist reviewed patient's recent thoughts and behaviors. Therapist utilized CBT to address anxiety. Therapist completed treatment plan with patient. Therapist processed patient's feelings to identify triggers for anxiety. Therapist updated patient's treatment plan.    Plan: Return again in 3-4 weeks.  Diagnosis: Axis I: Generalized Anxiety Disorder    Axis II: No diagnosis    Bynum Bellows, LCSW 06/10/2018

## 2018-06-25 ENCOUNTER — Ambulatory Visit (HOSPITAL_COMMUNITY): Payer: PRIVATE HEALTH INSURANCE | Admitting: Psychiatry

## 2018-06-29 ENCOUNTER — Ambulatory Visit: Payer: Managed Care, Other (non HMO) | Admitting: Family Medicine

## 2018-06-29 ENCOUNTER — Encounter: Payer: Self-pay | Admitting: Family Medicine

## 2018-06-29 ENCOUNTER — Other Ambulatory Visit: Payer: Self-pay

## 2018-06-29 VITALS — BP 118/72 | Temp 98.2°F | Wt 126.4 lb

## 2018-06-29 DIAGNOSIS — R300 Dysuria: Secondary | ICD-10-CM | POA: Diagnosis not present

## 2018-06-29 DIAGNOSIS — N200 Calculus of kidney: Secondary | ICD-10-CM

## 2018-06-29 LAB — POCT URINALYSIS DIPSTICK
PH UA: 6 (ref 5.0–8.0)
Spec Grav, UA: 1.015 (ref 1.010–1.025)

## 2018-06-29 MED ORDER — ONDANSETRON 4 MG PO TBDP: 4.0000 mg | ORAL_TABLET | Freq: Four times a day (QID) | ORAL | 0 refills | Status: DC | PRN

## 2018-06-29 MED ORDER — HYDROCODONE-ACETAMINOPHEN 5-325 MG PO TABS: ORAL_TABLET | ORAL | 0 refills | Status: DC

## 2018-06-29 NOTE — Progress Notes (Signed)
   Subjective:    Patient ID: Renee Savage, female    DOB: Jun 13, 2000, 18 y.o.   MRN: [id number]  HPI Patient woke up with back pain that moved into her abdomen-started 4:45am this am- vomited from the pain.  Results for orders placed or performed in visit on 06/29/18  POCT urinalysis dipstick  Result Value Ref Range   Color, UA     Clarity, UA     Glucose, UA     Bilirubin, UA     Ketones, UA     Spec Grav, UA 1.015 1.010 - 1.025   Blood, UA moderate    pH, UA 6.0 5.0 - 8.0   Protein, UA     Urobilinogen, UA     Nitrite, UA     Leukocytes, UA     Appearance     Odor      Pain hitvery hard 4;45 .  Colicky nature.  Sudden onset.  Accompanied by vomiting pain very severe.  Pain now much better   Left flank pain,   Review of Systems No headache no chills no fever    Objective:   Physical Exam  Alert active good hydration HEENT normal lungs clear left CVA tenderness abdomen hyperactive bowel sounds suprapubic tenderness  Urinalysis 2-3 red blood cells per high-power field      Assessment & Plan:  Impression highly likely kidney stone.  Discussed.  Would like to keep this child out of the hospital with what all is going on right now.  Hydrate.  Anti-inflammatory medicine.  Pain medicine.  Screen urine.  Likely stone.  Warning signs discussed at length  Greater than 50% of this 25 minute face to face visit was spent in counseling and discussion and coordination of care regarding the above diagnosis/diagnosies

## 2018-06-29 NOTE — Patient Instructions (Addendum)
Two aleave twice per day with food  zofran  As needed for nausea  Some pain meds  Increase fluid intake  No studies for now we dont need a study to confirm what you got  Screen your urine  Urine screen   If terrible pain persits days, we will have to order    Kidney Stones  Kidney stones (urolithiasis) are solid, rock-like deposits that form inside of the organs that make urine (kidneys). A kidney stone may form in a kidney and move into the bladder, where it can cause intense pain and block the flow of urine. Kidney stones are created when high levels of certain minerals are found in the urine. They are usually passed through urination, but in some cases, medical treatment may be needed to remove them. What are the causes? Kidney stones may be caused by:  A condition in which certain glands produce too much parathyroid hormone (primary hyperparathyroidism), which causes too much calcium buildup in the blood.  Buildup of uric acid crystals in the bladder (hyperuricosuria). Uric acid is a chemical that the body produces when you eat certain foods. It usually exits the body in the urine.  Narrowing (stricture) of one or both of the tubes that drain urine from the kidneys to the bladder (ureters).  A kidney blockage that is present at birth (congenital obstruction).  Past surgery on the kidney or the ureters, such as gastric bypass surgery. What increases the risk? The following factors make you more likely to develop kidney stones:  Having had a kidney stone in the past.  Having a family history of kidney stones.  Not drinking enough water.  Eating a diet that is high in protein, salt (sodium), or sugar.  Being overweight or obese. What are the signs or symptoms? Symptoms of a kidney stone may include:  Nausea.  Vomiting.  Blood in the urine (hematuria).  Pain in the side of the abdomen, right below the ribs (flank pain). Pain usually spreads (radiates) to the  groin.  Needing to urinate frequently or urgently. How is this diagnosed? This condition may be diagnosed based on:  Your medical history.  A physical exam.  Blood tests.  Urine tests.  CT scan.  Abdominal X-ray.  A procedure to examine the inside of the bladder (cystoscopy). How is this treated? Treatment for kidney stones depends on the size, location, and makeup of the stones. Treatment may involve:  Analyzing your urine before and after you pass the stone through urination.  Being monitored at the hospital until you pass the stone through urination.  Increasing your fluid intake and decreasing the amount of calcium and protein in your diet.  A procedure to break up kidney stones in the bladder using: ? A focused beam of light (laser therapy). ? Shock waves (extracorporeal shock wave lithotripsy).  Surgery to remove kidney stones. This may be needed if you have severe pain or have stones that block your urinary tract. Follow these instructions at home: Eating and drinking  Drink enough fluid to keep your urine clear or pale yellow. This will help you to pass the kidney stone.  If directed, change your diet. This may include: ? Limiting how much sodium you eat. ? Eating more fruits and vegetables. ? Limiting how much meat, poultry, fish, and eggs you eat.  Follow instructions from your health care provider about eating or drinking restrictions. General instructions  Collect urine samples as told by your health care provider. You may need  to collect a urine sample: ? 24 hours after you pass the stone. ? 8-12 weeks after passing the kidney stone, and every 6-12 months after that.  Strain your urine every time you urinate, for as long as directed. Use the strainer that your health care provider recommends.  Do not throw out the kidney stone after passing it. Keep the stone so it can be tested by your health care provider. Testing the makeup of your kidney stone may  help prevent you from getting kidney stones in the future.  Take over-the-counter and prescription medicines only as told by your health care provider.  Keep all follow-up visits as told by your health care provider. This is important. You may need follow-up X-rays or ultrasounds to make sure that your stone has passed. How is this prevented? To prevent another kidney stone:  Drink enough fluid to keep your urine clear or pale yellow. This is the best way to prevent kidney stones.  Eat a healthy diet and follow recommendations from your health care provider about foods to avoid. You may be instructed to eat a low-protein diet. Recommendations vary depending on the type of kidney stone that you have.  Maintain a healthy weight. Contact a health care provider if:  You have pain that gets worse or does not get better with medicine. Get help right away if:  You have a fever or chills.  You develop severe pain.  You develop new abdominal pain.  You faint.  You are unable to urinate. This information is not intended to replace advice given to you by your health care provider. Make sure you discuss any questions you have with your health care provider. Document Released: 03/24/2005 Document Revised: 09/04/2016 Document Reviewed: 09/07/2015 Elsevier Interactive Patient Education  2019 [employer].

## 2018-07-05 ENCOUNTER — Encounter: Payer: Self-pay | Admitting: Family Medicine

## 2018-07-26 ENCOUNTER — Ambulatory Visit (INDEPENDENT_AMBULATORY_CARE_PROVIDER_SITE_OTHER): Payer: Managed Care, Other (non HMO) | Admitting: Family Medicine

## 2018-07-26 ENCOUNTER — Other Ambulatory Visit: Payer: Self-pay

## 2018-07-26 ENCOUNTER — Encounter: Payer: Self-pay | Admitting: Family Medicine

## 2018-07-26 VITALS — Wt 120.0 lb

## 2018-07-26 DIAGNOSIS — B349 Viral infection, unspecified: Secondary | ICD-10-CM | POA: Diagnosis not present

## 2018-07-26 NOTE — Progress Notes (Signed)
   Subjective:    Patient ID: Renee Savage, female    DOB: 01/27/2001, 18 y.o.   MRN: [id number] Audio plus visual Influenza  This is a new problem. Episode onset: July 09 2018. Associated symptoms include congestion, coughing, fatigue, a fever and headaches. Associated symptoms comments: Runny nose, shortness of breath that has subsided. . She has tried rest (fluids, humidifier, mucinex, inhaler) for the symptoms. The treatment provided mild relief.   Pt states that dad work at hospital in [place] and they deal with COVID-19 patients. Pt lives in home with parent.   Virtual Visit via Video Note  I connected with Renee Savage on 07/26/18 at  1:10 PM EDT by a video enabled telemedicine application and verified that I am speaking with the correct person using two identifiers.   I discussed the limitations of evaluation and management by telemedicine and the availability of in person appointments. The patient expressed understanding and agreed to proceed.  History of Present Illness:    Observations/Objective:   Assessment and Plan:   Follow Up Instructions:    I discussed the assessment and treatment plan with the patient. The patient was provided an opportunity to ask questions and all were answered. The patient agreed with the plan and demonstrated an understanding of the instructions.   The patient was advised to call back or seek an in-person evaluation if the symptoms worsen or if the condition fails to improve as anticipated.  I provided  minutes of non-face-to-face time during this encounter.   Marlowe [place], LPN  Patient notes upwards of 3 weeks worth of symptoms.  Had some shortness of breath in the first week.  Now fever in this past week.  Notes potential exposures through her father's workplace.  Review of Systems  Constitutional: Positive for fatigue and fever.  HENT: Positive for congestion.   Respiratory: Positive for cough.   Neurological: Positive for  headaches.       Objective:   Physical Exam  Virtual visit      Assessment & Plan:  Impression viral syndrome.  Doubt COVID-19 but could be common discussed.  No indication for testing per CDC guideline.  Warning signs discussed.  Symptom care discussed.  Later this summer may want to consider antibody test when available

## 2018-07-27 ENCOUNTER — Ambulatory Visit (INDEPENDENT_AMBULATORY_CARE_PROVIDER_SITE_OTHER): Payer: PRIVATE HEALTH INSURANCE | Admitting: Licensed Clinical Social Worker

## 2018-07-27 ENCOUNTER — Encounter (HOSPITAL_COMMUNITY): Payer: Self-pay | Admitting: Licensed Clinical Social Worker

## 2018-07-27 DIAGNOSIS — F411 Generalized anxiety disorder: Secondary | ICD-10-CM

## 2018-07-27 NOTE — Progress Notes (Signed)
Virtual Visit via Video Note  I connected with Renee Savage on 07/27/18 at  1:00 PM EDT by a video enabled telemedicine application and verified that I am speaking with the correct person using two identifiers.   I discussed the limitations of evaluation and management by telemedicine and the availability of in person appointments. The patient expressed understanding and agreed to proceed.  Participation Level: Active  Behavioral Response: CasualAlertAnxious  Type of Therapy: Individual Therapy  Treatment Goals addressed: Coping  Interventions: CBT and Solution Focused  Summary: Renee Savage is a 18 y.o. female who presents oriented x5 (person, place, situation, time, and object), casually dressed, appropriately groomed, average height, slender, and cooperative to address anxiety. Patient has a  History of medical treatment including diet for what was thought to be a gluten intolerance, GI issues and cyst on her hand. Patient has a history of mental health treatment including medication management. Patient denies suicidal and homicidal ideations. Patient denies substance abuse. Patient is at low risk for lethality at this time.  Patient has been sick for the last 3 weeks. She had symptoms related to COVID19 but doctors were not sure what she had. She has been isolating at home in her room. 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 Patient is noticed that the tension has continued. Her father doesn't help around the house and has not helped with disinfecting the home even though he works in a hospital that has had COVID19 patients. Patient still hasn't told the rest of her family that she switched majors and worries about it because she thinks her brother as well as her father will have a negative reaction. Patient noted that she needs to rest. She feels like this is why her sickness has  been prolonged.   Patient engaged in session. She responded well to interventions. Patient continues to meet criteria for Generalized Anxiety Disorder. Patient will continue in outpatient therapy due to being the least restrictive service to meet her needs at this time. Patient made minimal progress on her goals.   Suicidal/Homicidal: Negativewithout intent/plan  Therapist Response: Therapist reviewed patient's recent thoughts and behaviors. Therapist utilized CBT to address anxiety. Therapist completed treatment plan with patient. Therapist processed patient's feelings to identify triggers for anxiety. Therapist discussed how patient has been coping with quarantine related to COVID19.  Plan: Return again in 3-4 weeks.  Diagnosis: Axis I: Generalized Anxiety Disorder    Axis II: No diagnosis   I discussed the assessment and treatment plan with the patient. The patient was provided an opportunity to ask questions and all were answered. The patient agreed with the plan and demonstrated an understanding of the instructions.   The patient was advised to call back or seek an in-person evaluation if the symptoms worsen or if the condition fails to improve as anticipated.  I provided 35 minutes of non-face-to-face time during this encounter.   Bynum Bellows, LCSW 07/27/2018

## 2018-08-02 ENCOUNTER — Ambulatory Visit (INDEPENDENT_AMBULATORY_CARE_PROVIDER_SITE_OTHER): Payer: PRIVATE HEALTH INSURANCE | Admitting: Psychiatry

## 2018-08-02 ENCOUNTER — Encounter (HOSPITAL_COMMUNITY): Payer: Self-pay | Admitting: Psychiatry

## 2018-08-02 ENCOUNTER — Other Ambulatory Visit: Payer: Self-pay

## 2018-08-02 ENCOUNTER — Encounter: Payer: Self-pay | Admitting: Family Medicine

## 2018-08-02 DIAGNOSIS — F411 Generalized anxiety disorder: Secondary | ICD-10-CM | POA: Diagnosis not present

## 2018-08-02 MED ORDER — ESCITALOPRAM OXALATE 20 MG PO TABS: 20.0000 mg | ORAL_TABLET | Freq: Every day | ORAL | 2 refills | Status: DC

## 2018-08-02 NOTE — Progress Notes (Signed)
BH MD/PA/NP OP Progress Note  08/02/2018 10:27 AM Renee BasemanCatherine A Savage  MRN:  [id number]  Chief Complaint:  anxiety  Virtual Visit via Video Note  I connected with Renee Basemanatherine A Savage on 08/02/18 at 10:00 AM EDT by a video enabled telemedicine application and verified that I am speaking with the correct person using two identifiers.   I discussed the limitations of evaluation and management by telemedicine and the availability of in person appointments. The patient expressed understanding and agreed to proceed.     I discussed the assessment and treatment plan with the patient. The patient was provided an opportunity to ask questions and all were answered. The patient agreed with the plan and demonstrated an understanding of the instructions.   The patient was advised to call back or seek an in-person evaluation if the symptoms worsen or if the condition fails to improve as anticipated.  I provided 15 minutes of non-face-to-face time during this encounter.   Renee Rudereborah Kyle Luppino, MD   HPI: This patient is a 18 year old single white female who lives at home with her parents and 20 year old brother. 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  The patient was referred by her primary physician, Dr. Lilyan PuntScott Luking for further assessment and treatment of anxiety.  The patient states that she has been anxious for a number of years. Some of this started in middle school because she was being bullied. She also has a very contentious relationship with her father. She states that he tries to control everyone in the family but particularly her. They are always butting heads. She states that he comes from a "military family "and tends to be harsh. He is very critical of her appearance, her relationships, her major in school. He is also constantly criticizing little things like the timing of when she pays her tuition,  how she does things around the house etc. By her report he is very harsh and judgmental. He pushed her constantly to go into the same feel that he is-speech therapy even though she does not want to. He is very against her living on campus because of the cost.  The patient states over the last few months she has been extremely irritable, crying at the drop of a hat anxious and worried. She tends to obsess about things and expect the worst. She is very self-critical. She was dating a boy last fall and he broke up with her in December and this caused some of the symptoms to intensify. She has never had previous psychiatric treatment because her father does not believe in mental health treatment. She does have a lot of GI issues such as stomachaches and diarrhea which Dr. Gerda DissLuking thinks are related to stress. She is not able to sleep well and is constantly wakes up through the night. She has had some panic attacks particularly during arguments with dad. She denies being suicidal or engaging in any form of self-harm. She does not use alcohol drugs cigarettes or vaping. She is never been sexually active. She and her mother are very close and she is also close to her brother. She seems extremely managed in the family, trying to protect her brother from the father and also trying to make up for her mother's chronic headaches by being a "second mom"to brother who picks him up at school etc  The patient returns for follow-up after 3 months via video telemedicine due  to the coronavirus pandemic.  She states that she is doing okay but she has been sick for the last 4 weeks.  She had some sort of viral illness but her doctor was not convinced that it was COVID-19 and she did not require testing.  She states that she still has a "fever" of up to 99.9 at times and the rest the time it is normal.  Her family is very paranoid about letting her leave her room so she has just been staying and they are working on  her schoolwork and writing her books.  She is keeping up with some of her writing friends.  She is having problems sleeping but I think this is because she is not getting any fresh air and sunlight.  Her mother is concerned about her going out into the hallway and affecting the rest of the family.  I urged her to call her primary doctor to discuss this because 48 does not sound like a real fever to me and she is being sequestered in her room now for 4 weeks. Visit Diagnosis:    ICD-10-CM   1. Generalized anxiety disorder F41.1     Past Psychiatric History: none  Past Medical History:  Past Medical History:  Diagnosis Date  . Allergy    Chronic  . Angio-edema   . Anxiety   . Asthma   . Beta thalassemia (HCC)   . Eczema   . IBS (irritable bowel syndrome)   . Reflux   . Urticaria     Past Surgical History:  Procedure Laterality Date  . CYST REMOVAL HAND    . WISDOM TOOTH EXTRACTION  2016    Family Psychiatric History: see below  Family History:  Family History  Problem Relation Age of Onset  . Anxiety disorder Mother   . Depression Mother   . Depression Father   . Anxiety disorder Brother   . Depression Brother   . Depression Paternal Aunt   . Bipolar disorder Other     Social History:  Social History   Socioeconomic History  . Marital status: Single    Spouse name: Not on file  . Number of children: Not on file  . Years of education: Not on file  . Highest education level: Not on file  Occupational History  . Not on file  Social Needs  . Financial resource strain: Not on file  . Food insecurity:    Worry: Not on file    Inability: Not on file  . Transportation needs:    Medical: Not on file    Non-medical: Not on file  Tobacco Use  . Smoking status: Never Smoker  . Smokeless tobacco: Never Used  Substance and Sexual Activity  . Alcohol use: No  . Drug use: No  . Sexual activity: Never  Lifestyle  . Physical activity:    Days per week: Not on file     Minutes per session: Not on file  . Stress: Not on file  Relationships  . Social connections:    Talks on phone: Not on file    Gets together: Not on file    Attends religious service: Not on file    Active member of club or organization: Not on file    Attends meetings of clubs or organizations: Not on file    Relationship status: Not on file  Other Topics Concern  . Not on file  Social History Narrative  . Not on file    Allergies:  Allergies  Allergen Reactions  . Cefzil [Cefprozil] Nausea And Vomiting    Vomiting (as an baby)  . Gluten Meal   . Trimox [Amoxicillin] Nausea And Vomiting    Vomiting (as an baby)    Metabolic Disorder Labs: No results found for: HGBA1C, MPG No results found for: PROLACTIN No results found for: CHOL, TRIG, HDL, CHOLHDL, VLDL, LDLCALC No results found for: TSH  Therapeutic Level Labs: No results found for: LITHIUM No results found for: VALPROATE No components found for:  CBMZ  Current Medications: Current Outpatient Medications  Medication Sig Dispense Refill  . albuterol (PROVENTIL HFA;VENTOLIN HFA) 108 (90 Base) MCG/ACT inhaler Inhale 2 puffs into the lungs every 6 (six) hours as needed for wheezing. 1 Inhaler 2  . diphenhydrAMINE HCl (BENADRYL ALLERGY PO) Take by mouth.    . escitalopram (LEXAPRO) 20 MG tablet Take 1 tablet (20 mg total) by mouth daily. 90 tablet 2  . HYDROcodone-acetaminophen (NORCO/VICODIN) 5-325 MG tablet One tablet every 4-6 hours as needed for pain 24 tablet 0  . Melatonin 10 MG TABS Take by mouth at bedtime as needed.    . Multiple Vitamin (MULTIVITAMIN) tablet Take 2 tablets by mouth at bedtime.     . Norethindrone-Ethinyl Estradiol-Fe Biphas (LO LOESTRIN FE) 1 MG-10 MCG / 10 MCG tablet Take 1 tablet by mouth daily. 1 Package 11  . ondansetron (ZOFRAN ODT) 4 MG disintegrating tablet Take 1 tablet (4 mg total) by mouth every 6 (six) hours as needed for nausea or vomiting. 20 tablet 0  . oseltamivir (TAMIFLU) 75  MG capsule Take 1 capsule (75 mg total) by mouth 2 (two) times daily. 10 capsule 0  . Probiotic Product (PROBIOTIC DAILY PO) Take by mouth.     No current facility-administered medications for this visit.      Musculoskeletal: Strength & Muscle Tone: within normal limits Gait & Station: normal Patient leans: N/A  Psychiatric Specialty Exam: Review of Systems  HENT: Positive for congestion.   Psychiatric/Behavioral: The patient is nervous/anxious.   All other systems reviewed and are negative.   There were no vitals taken for this visit.There is no height or weight on file to calculate BMI.  General Appearance: Casual and Fairly Groomed  Eye Contact:  Good  Speech:  Clear and Coherent  Volume:  Normal  Mood:  Anxious  Affect:  Appropriate  Thought Process:  Goal Directed  Orientation:  Full (Time, Place, and Person)  Thought Content: Rumination   Suicidal Thoughts:  No  Homicidal Thoughts:  No  Memory:  Immediate;   Good Recent;   Good Remote;   Good  Judgement:  Fair  Insight:  Fair  Psychomotor Activity:  Decreased  Concentration:  Concentration: Good and Attention Span: Good  Recall:  Good  Fund of Knowledge: Good  Language: Good  Akathisia:  No  Handed:  Right  AIMS (if indicated): not done  Assets:  Communication Skills Desire for Improvement Resilience Social Support Talents/Skills  ADL's:  Intact  Cognition: WNL  Sleep:  Fair   Screenings: GAD-7     Office Visit from 10/16/2017 in [facility]  Total GAD-7 Score  16       Assessment and Plan: This patient is a 18 year old female with a history of anxiety.  Her family tends to exacerbate her anxiety but being critical and overly cautious.  I urged her to contact her family doctor to make sure that she really needs to be sequestered in her room for this  long.  She would do better to be able to get outdoors and get some fresh air and exercise.  For now she feels the medication has been  helpful for her anxiety so we will continue Lexapro 20 mg daily.  She will return to see me in 2 months   Renee Ruder, MD 08/02/2018, 10:27 AM

## 2018-08-10 ENCOUNTER — Other Ambulatory Visit: Payer: Self-pay

## 2018-08-10 ENCOUNTER — Encounter (HOSPITAL_COMMUNITY): Payer: Self-pay | Admitting: Licensed Clinical Social Worker

## 2018-08-10 ENCOUNTER — Ambulatory Visit (INDEPENDENT_AMBULATORY_CARE_PROVIDER_SITE_OTHER): Payer: PRIVATE HEALTH INSURANCE | Admitting: Licensed Clinical Social Worker

## 2018-08-10 DIAGNOSIS — F411 Generalized anxiety disorder: Secondary | ICD-10-CM

## 2018-08-10 NOTE — Progress Notes (Signed)
Virtual Visit via Video Note  I connected with Renee Savage on 08/10/18 at  1:00 PM EDT by a video enabled telemedicine application and verified that I am speaking with the correct person using two identifiers.   I discussed the limitations of evaluation and management by telemedicine and the availability of in person appointments. The patient expressed understanding and agreed to proceed.  Participation Level: Active  Behavioral Response: CasualAlertAnxious  Type of Therapy: Individual Therapy  Treatment Goals addressed: Coping  Interventions: CBT and Solution Focused  Summary: Renee Savage is a 18 y.o. female who presents oriented x5 (person, place, situation, time, and object), casually dressed, appropriately groomed, average height, slender, and cooperative to address anxiety. Patient has a  History of medical treatment including diet for what was thought to be a gluten intolerance, GI issues and cyst on her hand. Patient has a history of mental health treatment including medication management. Patient denies suicidal and homicidal ideations. Patient denies substance abuse. Patient is at low risk for lethality at this time.  Patient has recovered from her sickness. She is feeling tired and has experienced some hair loss--more than normal. Patient's knee is hurting her as well. Patient's relationship has been strained. They have been arguing which impacts patient. She feels responsibility to make things ok in the home. Patient is acting as the parentified child and cares for the household including her younger brother. She is also trying to work hard in school and this week is final exam week. She worries about her mother's health and takes on the responsibilities of the home so her mother doesn't have to work as hard. Patient's mood flucuates depending on how the "household" is doing. Patient has also engaged in small acts of rebellion against her father's high and unreasonable  expectations of her. She is wearing glasses, wears her hair like she wants, switched her major, etc which were all things her father didn't want her to do. She feels like their relationship is conditional. If she meets his expectations, then things are fine but if she doesn't then guilt, shame, etc follow.   Patient engaged in session. She responded well to interventions. Patient continues to meet criteria for Generalized Anxiety Disorder. Patient will continue in outpatient therapy due to being the least restrictive service to meet her needs at this time. Patient made minimal progress on her goals.   Suicidal/Homicidal: Negativewithout intent/plan  Therapist Response: Therapist reviewed patient's recent thoughts and behaviors. Therapist utilized CBT to address anxiety. Therapist completed treatment plan with patient. Therapist processed patient's feelings to identify triggers for anxiety. Therapist discussed with patient her expectations of herself and the expectations that her father places on her as well as how she deals with those expectations.   Plan: Return again in 3-4 weeks.  Diagnosis: Axis I: Generalized Anxiety Disorder    Axis II: No diagnosis   I discussed the assessment and treatment plan with the patient. The patient was provided an opportunity to ask questions and all were answered. The patient agreed with the plan and demonstrated an understanding of the instructions.   The patient was advised to call back or seek an in-person evaluation if the symptoms worsen or if the condition fails to improve as anticipated.  I provided 45 minutes of non-face-to-face time during this encounter.   Bynum Bellows, LCSW 08/10/2018

## 2018-08-20 ENCOUNTER — Encounter: Payer: Self-pay | Admitting: Family Medicine

## 2018-08-20 ENCOUNTER — Other Ambulatory Visit: Payer: Self-pay

## 2018-08-20 ENCOUNTER — Ambulatory Visit (INDEPENDENT_AMBULATORY_CARE_PROVIDER_SITE_OTHER): Payer: Managed Care, Other (non HMO) | Admitting: Family Medicine

## 2018-08-20 DIAGNOSIS — L906 Striae atrophicae: Secondary | ICD-10-CM | POA: Diagnosis not present

## 2018-08-20 NOTE — Progress Notes (Signed)
   Subjective:    Patient ID: Renee Savage, female    DOB: 12-07-2000, 18 y.o.   MRN: [id number] Audio plus video HPI Red marks on both breast and both sides that pt thinks is stretch marks. She has gained about 15 lbs in the past month.   Virtual Visit via Video Note  I connected with Renee Savage on 08/20/18 at 10:00 AM EDT by a video enabled telemedicine application and verified that I am speaking with the correct person using two identifiers.  Location: Patient: home  Provider: office   I discussed the limitations of evaluation and management by telemedicine and the availability of in person appointments. The patient expressed understanding and agreed to proceed.  History of Present Illness:    Observations/Objective:   Assessment and Plan:   Follow Up Instructions:    I discussed the assessment and treatment plan with the patient. The patient was provided an opportunity to ask questions and all were answered. The patient agreed with the plan and demonstrated an understanding of the instructions.   The patient was advised to call back or seek an in-person evaluation if the symptoms worsen or if the condition fails to improve as anticipated.  I provided 15 minutes of non-face-to-face time during this encounter.      Review of Systems No headache, no major weight loss or weight gain, no chest pain no back pain abdominal pain no change in bowel habits complete ROS otherwise negative     Objective:   Physical Exam  Virtual visit      Assessment & Plan:  Impression stria.  Lateral abdomen low back.  And breasts.  In the past 6 weeks with substantial weight gain.  15 pound weight gain.  Long discussion held regarding exercise and diet.  Also treatment of history with vitamin-containing lotions might help a little

## 2018-09-21 ENCOUNTER — Telehealth (HOSPITAL_COMMUNITY): Payer: Self-pay | Admitting: Psychiatry

## 2018-09-21 ENCOUNTER — Other Ambulatory Visit (HOSPITAL_COMMUNITY): Payer: Self-pay | Admitting: Psychiatry

## 2018-09-21 MED ORDER — ESCITALOPRAM OXALATE 20 MG PO TABS: 20.0000 mg | ORAL_TABLET | Freq: Two times a day (BID) | ORAL | 2 refills | Status: DC

## 2018-09-21 NOTE — Telephone Encounter (Signed)
I increased lexapro to 20 mg bid. She needs to keep appt on 6/29

## 2018-09-21 NOTE — Telephone Encounter (Signed)
Patient called and said that she has been experiencing overwhelming anxiety for the last several weeks and she wants to know can her Lexapro be increased to a higher dosage?

## 2018-09-22 NOTE — Telephone Encounter (Signed)
Called lvm about below message and to call with any concerns

## 2018-09-25 ENCOUNTER — Other Ambulatory Visit: Payer: Self-pay

## 2018-09-25 ENCOUNTER — Emergency Department (HOSPITAL_COMMUNITY): Payer: No Typology Code available for payment source

## 2018-09-25 ENCOUNTER — Emergency Department (HOSPITAL_COMMUNITY)
Admission: EM | Admit: 2018-09-25 | Discharge: 2018-09-25 | Disposition: A | Discharge status: Home / Self Care | Payer: No Typology Code available for payment source | Attending: Emergency Medicine | Admitting: Emergency Medicine

## 2018-09-25 ENCOUNTER — Encounter (HOSPITAL_COMMUNITY): Payer: Self-pay | Admitting: Emergency Medicine

## 2018-09-25 DIAGNOSIS — Z79899 Other long term (current) drug therapy: Secondary | ICD-10-CM | POA: Diagnosis not present

## 2018-09-25 DIAGNOSIS — R079 Chest pain, unspecified: Secondary | ICD-10-CM | POA: Diagnosis present

## 2018-09-25 DIAGNOSIS — R0789 Other chest pain: Secondary | ICD-10-CM | POA: Diagnosis not present

## 2018-09-25 DIAGNOSIS — J45909 Unspecified asthma, uncomplicated: Secondary | ICD-10-CM | POA: Diagnosis not present

## 2018-09-25 LAB — CBC
HCT: 38.5 % (ref 36.0–46.0)
Hemoglobin: 12.1 g/dL (ref 12.0–15.0)
MCH: 28.5 pg (ref 26.0–34.0)
MCHC: 31.4 g/dL (ref 30.0–36.0)
MCV: 90.8 fL (ref 80.0–100.0)
Platelets: 187 10*3/uL (ref 150–400)
RBC: 4.24 MIL/uL (ref 3.87–5.11)
RDW: 13.7 % (ref 11.5–15.5)
WBC: 6.1 10*3/uL (ref 4.0–10.5)
nRBC: 0 % (ref 0.0–0.2)

## 2018-09-25 LAB — BASIC METABOLIC PANEL
Anion gap: 9 (ref 5–15)
BUN: 10 mg/dL (ref 6–20)
CO2: 23 mmol/L (ref 22–32)
Calcium: 9.1 mg/dL (ref 8.9–10.3)
Chloride: 105 mmol/L (ref 98–111)
Creatinine, Ser: 0.55 mg/dL (ref 0.44–1.00)
GFR calc Af Amer: >60 mL/min (ref >60)
GFR calc non Af Amer: >60 mL/min (ref >60)
Glucose, Bld: 87 mg/dL (ref 70–99)
Potassium: 3.4 mmol/L — ABNORMAL LOW (ref 3.5–5.1)
Sodium: 137 mmol/L (ref 135–145)

## 2018-09-25 LAB — I-STAT BETA HCG BLOOD, ED (MC, WL, AP ONLY): I-stat hCG, quantitative: <5 m[IU]/mL (ref <5)

## 2018-09-25 LAB — D-DIMER, QUANTITATIVE: D-Dimer, Quant: 0.39 ug/mL-FEU (ref 0.00–0.50)

## 2018-09-25 LAB — TROPONIN I: Troponin I: <0.03 ng/mL (ref <0.03)

## 2018-09-25 MED ORDER — ACETAMINOPHEN 500 MG PO TABS: 500.0000 mg | ORAL_TABLET | Freq: Four times a day (QID) | ORAL | 0 refills | Status: DC | PRN

## 2018-09-25 MED ORDER — KETOROLAC TROMETHAMINE 30 MG/ML IJ SOLN: 30.0000 mg | Freq: Once | INTRAMUSCULAR | Status: DC

## 2018-09-25 MED ORDER — LIDOCAINE 5 % EX PTCH: 1.0000 | MEDICATED_PATCH | CUTANEOUS | 0 refills | Status: DC

## 2018-09-25 MED ORDER — KETOROLAC TROMETHAMINE 30 MG/ML IJ SOLN
30.0000 mg | Freq: Once | INTRAMUSCULAR | Status: AC
  Administered 2018-09-25: 13:00:00 30 mg via INTRAVENOUS
  Filled 2018-09-25: qty 1

## 2018-09-25 MED ORDER — IBUPROFEN 400 MG PO TABS: 400.0000 mg | ORAL_TABLET | Freq: Four times a day (QID) | ORAL | 0 refills | Status: DC | PRN

## 2018-09-25 NOTE — Discharge Instructions (Addendum)
Your workup today was reassuring. No signs of blood clot in lungs or heart attack.  I suspect your pain is musculoskeletal in nature. You can alternate 400 mg of ibuprofen and [phone] mg of Tylenol every 3-6 hours as needed for pain. Do not exceed 4000 mg of Tylenol daily.  Take ibuprofen with food to avoid upset stomach issues.  You can also apply ice or heat, whichever feels best, 20 minutes at a time 3-4 times daily as needed for pain.  You may find it helpful to apply a lidocaine patch to the area of pain as well.  Follow-up with your primary care provider for reevaluation of your symptoms if they persist.  Return to the emergency department if any concerning signs or symptoms develop such as severe shortness of breath, worsening chest pains, high fevers, persistent vomiting, or passing out.

## 2018-09-25 NOTE — ED Provider Notes (Signed)
[facility] EMERGENCY DEPARTMENT Provider Note   CSN: [id number] Arrival date & time: 09/25/18  1200    History   Chief Complaint Chief Complaint  Patient presents with  . Chest Pain    HPI Renee Savage is a 18 y.o. female with history of beta thalassemia, IBS, generalized anxiety disorder, and asthma presents for evaluation of cute onset, progressively worsening pleuritic chest pain.  She reports that she had symptoms on Tuesday 4 days ago but these resolved within 2 hours after taking Tums and using her albuterol inhaler.  She reports that she was asymptomatic until she awoke this morning at around 8:30 AM and the pain was much more severe.  She reports a sharp stabbing pain under the left breast that at times radiates upwards.  At complete rest she has no pain but she experiences sharp pain with any deep inspiration, sometimes with movement.  Has any recent injury.  She again tried Tums and her albuterol without relief of her symptoms today.  She denies shortness of breath, abdominal pain, nausea, vomiting, fevers, cough.  She is a non-smoker, denies recreational drug use or alcohol intake.  No recent travel or surgeries however she reports that she was very sedentary in April and confined to/quarantined in her room due to an illness that her parents that could have been COVID-19.  Denies leg swelling but does report leg pain related to patellofemoral syndrome of the left knee which is chronic and unchanged.  No hemoptysis, no prior history of DVT or PE.  She is on OCPs.     The history is provided by the patient.    Past Medical History:  Diagnosis Date  . Allergy    Chronic  . Angio-edema   . Anxiety   . Asthma   . Beta thalassemia (HCC)   . Eczema   . IBS (irritable bowel syndrome)   . Reflux   . Urticaria     Patient Active Problem List   Diagnosis Date Noted  . Generalized anxiety disorder 11/09/2017  . Chronic abdominal pain 10/16/2017    Past Surgical History:   Procedure Laterality Date  . CYST REMOVAL HAND    . WISDOM TOOTH EXTRACTION  2016     OB History   No obstetric history on file.      Home Medications    Prior to Admission medications   Medication Sig Start Date End Date Taking? Authorizing Provider  albuterol (PROVENTIL HFA;VENTOLIN HFA) 108 (90 Base) MCG/ACT inhaler Inhale 2 puffs into the lungs every 6 (six) hours as needed for wheezing. 04/02/18  Yes Luking, Jonna CoupScott A, MD  escitalopram (LEXAPRO) 20 MG tablet Take 1 tablet (20 mg total) by mouth 2 (two) times a day. 09/21/18 09/21/19 Yes Myrlene Brokeross, Deborah R, MD  Melatonin 10 MG TABS Take by mouth at bedtime as needed.   Yes [provider]  Multiple Vitamin (MULTIVITAMIN) tablet Take 1 tablet by mouth at bedtime.    Yes [provider]  Norethindrone-Ethinyl Estradiol-Fe Biphas (LO LOESTRIN FE) 1 MG-10 MCG / 10 MCG tablet Take 1 tablet by mouth daily. 09/28/17  Yes Campbell RichesHoskins, Carolyn C, NP  ondansetron (ZOFRAN ODT) 4 MG disintegrating tablet Take 1 tablet (4 mg total) by mouth every 6 (six) hours as needed for nausea or vomiting. 06/29/18  Yes Merlyn AlbertLuking, William S, MD  acetaminophen (TYLENOL) 500 MG tablet Take 1 tablet (500 mg total) by mouth every 6 (six) hours as needed. 09/25/18   Jeanie SewerFawze, Annie Saephan A, PA-C  ibuprofen (ADVIL) 400 MG tablet Take 1 tablet (400 mg total) by mouth every 6 (six) hours as needed. 09/25/18   Kynleigh Artz A, PA-C  lidocaine (LIDODERM) 5 % Place 1 patch onto the skin daily. Remove & Discard patch within 12 hours or as directed by MD 09/25/18   Renita Papa, PA-C    Family History Family History  Problem Relation Age of Onset  . Anxiety disorder Mother   . Depression Mother   . Depression Father   . Anxiety disorder Brother   . Depression Brother   . Depression Paternal Aunt   . Bipolar disorder Other     Social History Social History   Tobacco Use  . Smoking status: Never Smoker  . Smokeless tobacco: Never Used  Substance Use Topics  . Alcohol  use: No  . Drug use: No     Allergies   Cefzil [cefprozil], Orange oil, and Trimox [amoxicillin]   Review of Systems Review of Systems  Constitutional: Negative for chills and fever.  Respiratory: Negative for cough and shortness of breath.   Cardiovascular: Positive for chest pain. Negative for palpitations and leg swelling.  Gastrointestinal: Negative for abdominal pain, nausea and vomiting.  All other systems reviewed and are negative.    Physical Exam Updated Vital Signs BP 95/62   Pulse 76   Temp 98.9 F (37.2 C) (Oral)   Resp (!) 22   Ht 5\' 2"  (1.575 m)   Wt 57.6 kg   LMP 09/06/2018 (Approximate)   SpO2 100%   BMI 23.23 kg/m   Physical Exam Vitals signs and nursing note reviewed.  Constitutional:      General: She is not in acute distress.    Appearance: She is well-developed.  HENT:     Head: Normocephalic and atraumatic.  Eyes:     General:        Right eye: No discharge.        Left eye: No discharge.     Conjunctiva/sclera: Conjunctivae normal.  Neck:     Vascular: No JVD.     Trachea: No tracheal deviation.  Cardiovascular:     Rate and Rhythm: Normal rate and regular rhythm.     Pulses:          Radial pulses are 2+ on the right side and 2+ on the left side.       Dorsalis pedis pulses are 2+ on the right side and 2+ on the left side.       Posterior tibial pulses are 2+ on the right side and 2+ on the left side.     Heart sounds: Normal heart sounds.     Comments: Mild calf tenderness present on the left (patient states this is not unusual for her due to her patellofemoral syndrome), no lower extremity edema, no palpable cords, compartments are soft  Pulmonary:     Breath sounds: Decreased breath sounds present.     Comments: Globally diminished breath sounds as patient has diminished effort in attempting to take deep breaths.  Speaking in full sentences without difficulty with stable SPO2 saturations Chest:     Chest wall: Tenderness present.        Comments: Some tenderness to palpation of the left lateral chest wall.  No deformity, crepitus, ecchymosis, or flail segment. Abdominal:     General: Bowel sounds are normal. There is no distension.     Palpations: Abdomen is soft. There is no mass.     Tenderness: There  is no abdominal tenderness. There is no guarding.  Musculoskeletal:     Right lower leg: She exhibits no tenderness. No edema.     Left lower leg: She exhibits tenderness. No edema.  Skin:    General: Skin is warm and dry.     Findings: No erythema.  Neurological:     Mental Status: She is alert.  Psychiatric:        Behavior: Behavior normal.      ED Treatments / Results  Labs (all labs ordered are listed, but only abnormal results are displayed) Labs Reviewed  BASIC METABOLIC PANEL - Abnormal; Notable for the following components:      Result Value   Potassium 3.4 (*)    All other components within normal limits  CBC  TROPONIN I  D-DIMER, QUANTITATIVE (NOT AT [facility])  I-STAT BETA HCG BLOOD, ED ([facility], [facility], [facility] ONLY)    EKG EKG Interpretation  Date/Time:  Saturday September 25 2018 12:18:09 EDT Ventricular Rate:  81 PR Interval:    QRS Duration: 84 QT Interval:  378 QTC Calculation: 439 R Axis:   78 Text Interpretation:  Sinus rhythm Borderline short PR interval Confirmed by Vanetta MuldersZackowski, Scott [phone]([id number]) on 09/25/2018 3:27:46 PM   Radiology Dg Chest 2 View  Result Date: 09/25/2018 CLINICAL DATA:  Acute LEFT chest and rib pain.  Initial encounter. EXAM: CHEST - 2 VIEW COMPARISON:  07/09/2015 and prior chest radiographs FINDINGS: The cardiomediastinal silhouette is unremarkable. There is no evidence of focal airspace disease, pulmonary edema, suspicious pulmonary nodule/mass, pleural effusion, or pneumothorax. No acute bony abnormalities are identified. IMPRESSION: No active cardiopulmonary disease. Electronically Signed   By: Harmon PierJeffrey  Hu M.D.   On: 09/25/2018 15:29    Procedures Procedures (including  critical care time)  Medications Ordered in ED Medications  ketorolac (TORADOL) 30 MG/ML injection 30 mg (30 mg Intravenous Given 09/25/18 1318)     Initial Impression / Assessment and Plan / ED Course  I have reviewed the triage vital signs and the nursing notes.  Pertinent labs & imaging results that were available during my care of the patient were reviewed by me and considered in my medical decision making (see chart for details).        Patient with pleuritic chest pain.  She had self-limited episode of this on Tuesday, returned today and was worse.  She is afebrile, vital signs are stable.  She is nontoxic in appearance.  Pain is reproducible on palpation.  Her EKG shows normal sinus rhythm, no ischemic abnormalities noted.  She is very low risk for heart disease and with reassuring EKG and negative troponin and I doubt ACS/MI.  I do not feel she requires serial troponin trending as her symptoms are quite atypical and more suggestive of musculoskeletal pain.  She is on OCPs however and so is not PERC negative.  Her d-dimer was negative and I doubt PE.  Chest x-ray shows no acute cardiopulmonary abnormalities.  Remainder of lab work reviewed by me shows no leukocytosis, no anemia, no metabolic derangements, no renal insufficiency.  She reports that she is feeling much better after Toradol and application of warm blanket over her chest.  I suspect that she has costochondritis or other musculoskeletal etiology of her symptoms.  Doubt dissection, cardiac tamponade, pneumonia, pneumothorax, or esophageal rupture.  Discussed conservative therapy and management.  Recommend follow-up with PCP if symptoms persist.  Discussed strict ED return precautions. Patient verbalized understanding of and agreement with plan and is safe for  discharge home at this time.   Final Clinical Impressions(s) / ED Diagnoses   Final diagnoses:  Atypical chest pain  Chest wall pain    ED Discharge Orders          Ordered    lidocaine (LIDODERM) 5 %  Every 24 hours     09/25/18 1605    ibuprofen (ADVIL) 400 MG tablet  Every 6 hours PRN     09/25/18 1605    acetaminophen (TYLENOL) 500 MG tablet  Every 6 hours PRN     09/25/18 1605           Jeanie SewerFawze, Nury Nebergall A, PA-C 09/25/18 1609    Bethann BerkshireZammit, Joseph, MD 09/25/18 1711

## 2018-09-25 NOTE — ED Triage Notes (Signed)
Pt c/o LT ribcage pain that worsens with deep inspiration. Reports that the pain is sharp. Denies injury/fall. Denies SOB.

## 2018-09-25 NOTE — ED Notes (Signed)
Reviewed pt's notes as triage nurse was concerned regarding the answer to "do you feel safe at home". Pt answered with "for the most part." When inquired further about that she relates her father is very critical of her and gets angry a lot. Denies any physical abuse although she is not happy living there. Review of recent psychiatrist notes show a report of the same.

## 2018-09-27 ENCOUNTER — Other Ambulatory Visit: Payer: Self-pay

## 2018-09-27 ENCOUNTER — Ambulatory Visit (INDEPENDENT_AMBULATORY_CARE_PROVIDER_SITE_OTHER): Payer: Managed Care, Other (non HMO) | Admitting: Family Medicine

## 2018-09-27 DIAGNOSIS — R0789 Other chest pain: Secondary | ICD-10-CM | POA: Diagnosis not present

## 2018-09-27 MED ORDER — ETODOLAC 400 MG PO TABS: 400.0000 mg | ORAL_TABLET | Freq: Two times a day (BID) | ORAL | 0 refills | Status: DC

## 2018-09-27 NOTE — Progress Notes (Signed)
   Subjective:    Patient ID: Renee Savage, female    DOB: Apr 21, 2000, 18 y.o.   MRN: [id number]  [place] follow up for chest pain. Went to ED on 6/20. Pain is under left breast. Pain is sharpe, comes and goes. Mostly feels the pain when taking deep breaths or laughing.  This patient had onset of left-sided sharp pain hurts with deep breath hurts with taking a breath twisting sneezing yawning because of the pain is because her skin significant discomfort.  Finally the patient states over the weekend became bad and she went to the ER had further evaluation including imaging and blood work and the best they could tell it was chest wall pain she states the pain is not much better it hurts with certain movements hurts with rotating bending sneezing coughing denies fever chills vomiting Virtual Visit via Video Note  I connected with Renee Savage on 09/27/18 at 11:30 AM EDT by a video enabled telemedicine application and verified that I am speaking with the correct person using two identifiers.  Location: Patient: home Provider: office   I discussed the limitations of evaluation and management by telemedicine and the availability of in person appointments. The patient expressed understanding and agreed to proceed.  History of Present Illness:    Observations/Objective:   Assessment and Plan:   Follow Up Instructions:    I discussed the assessment and treatment plan with the patient. The patient was provided an opportunity to ask questions and all were answered. The patient agreed with the plan and demonstrated an understanding of the instructions.   The patient was advised to call back or seek an in-person evaluation if the symptoms worsen or if the condition fails to improve as anticipated.  I provided 15 minutes of non-face-to-face time during this encounter.       Review of Systems  Constitutional: Negative for activity change and appetite change.  HENT: Negative for  congestion and rhinorrhea.   Respiratory: Negative for cough and shortness of breath.   Cardiovascular: Positive for chest pain. Negative for leg swelling.  Gastrointestinal: Negative for abdominal pain, nausea and vomiting.  Skin: Negative for color change.  Neurological: Negative for dizziness and weakness.  Psychiatric/Behavioral: Negative for agitation and confusion.   Positive chest pain in regards to sharp chest pains    Objective:   Physical Exam  Patient had virtual visit Appears to be in no distress Atraumatic Neuro able to relate and oriented No apparent resp distress Color normal       Assessment & Plan:  More than likely this is a inflammatory condition I would recommend a short course of anti-inflammatories twice daily over the next 5 to 7 days then after that as needed Patient does have irritable bowel and has a hard time tolerating anti-inflammatories hopefully she would do okay with this for such a short course I would not recommend a CAT scan at this point but if not significantly improved over the next 14 days to do a follow-up Lab work x-ray and note from the ER was reviewed certainly call back if worse

## 2018-10-04 ENCOUNTER — Encounter (HOSPITAL_COMMUNITY): Payer: Self-pay | Admitting: Psychiatry

## 2018-10-04 ENCOUNTER — Ambulatory Visit (INDEPENDENT_AMBULATORY_CARE_PROVIDER_SITE_OTHER): Payer: PRIVATE HEALTH INSURANCE | Admitting: Psychiatry

## 2018-10-04 ENCOUNTER — Other Ambulatory Visit: Payer: Self-pay

## 2018-10-04 DIAGNOSIS — F411 Generalized anxiety disorder: Secondary | ICD-10-CM

## 2018-10-04 NOTE — Progress Notes (Signed)
BH MD/PA/NP OP Progress Note  10/04/2018 10:38 AM Renee Savage  MRN:  [id number]  Chief Complaint:  Chief Complaint    Depression; Anxiety; Follow-up    Virtual Visit via Video Note  I connected with Renee Savage on 10/04/18 at 10:20 AM EDT by a video enabled telemedicine application and verified that I am speaking with the correct person using two identifiers.   I discussed the limitations of evaluation and management by telemedicine and the availability of in person appointments. The patient expressed understanding and agreed to proceed.      I discussed the assessment and treatment plan with the patient. The patient was provided an opportunity to ask questions and all were answered. The patient agreed with the plan and demonstrated an understanding of the instructions.   The patient was advised to call back or seek an in-person evaluation if the symptoms worsen or if the condition fails to improve as anticipated.  I provided 15 minutes of non-face-to-face time during this encounter.   [name] Tyishia Aune, MD   [id number]:This patient is a 18 year old single white female who lives at home with her parents and 20 year old brother. 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  The patient was referred by her primary physician, Dr. Lilyan PuntScott Luking for further assessment and treatment of anxiety.  The patient states that she has been anxious for a number of years. Some of this started in middle school because she was being bullied. She also has a very contentious relationship with her father. She states that he tries to control everyone in the family but particularly her. They are always butting heads. She states that he comes from a "military family "and tends to be harsh. He is very critical of her appearance, her relationships, her major in school. He is also constantly criticizing little  things like the timing of when she pays her tuition, how she does things around the house etc. By her report he is very harsh and judgmental. He pushed her constantly to go into the same feel that he is-speech therapy even though she does not want to. He is very against her living on campus because of the cost.  The patient states over the last few months she has been extremely irritable, crying at the drop of a hat anxious and worried. She tends to obsess about things and expect the worst. She is very self-critical. She was dating a boy last fall and he broke up with her in December and this caused some of the symptoms to intensify. She has never had previous psychiatric treatment because her father does not believe in mental health treatment. She does have a lot of GI issues such as stomachaches and diarrhea which Dr. Gerda DissLuking thinks are related to stress. She is not able to sleep well and is constantly wakes up through the night. She has had some panic attacks particularly during arguments with dad. She denies being suicidal or engaging in any form of self-harm. She does not use alcohol drugs cigarettes or vaping. She is never been sexually active. She and her mother are very close and she is also close to her brother. She seems extremely managed in the family, trying to protect her brother from the father and also trying to make up for her mother's chronic headaches by being a "second mom"to brother who picks him up at school etc  The patient returns  for follow-up after 3 months.  She states that this past month has been quite rough for her.  With publisher of her book became very critical and wanted her to write the book for an older audience and make it have "darker themes."  The patient states that she would not agree to this and they ended up parting ways.  She became so stressed and panic that she was hyperventilating a lot and actually developed chest wall pain and was seen in the  emergency room and also by Dr. Gerda DissLuking.  She is now on a nonsteroidal which seems to have helped.  She had called last week and stated she was stressed so I increased her Lexapro to 20 mg twice daily.  She is trying to get outside little bit more and get more fresh air.  She is taking 2 college courses online right now.  She also states that her mother has fibromyalgia and she is trying to help her as much as she can.  She denies any thoughts of self-harm or suicide Visit Diagnosis:    ICD-10-CM   1. Generalized anxiety disorder  F41.1     Past Psychiatric History: none  Past Medical History:  Past Medical History:  Diagnosis Date  . Allergy    Chronic  . Angio-edema   . Anxiety   . Asthma   . Beta thalassemia (HCC)   . Eczema   . IBS (irritable bowel syndrome)   . Reflux   . Urticaria     Past Surgical History:  Procedure Laterality Date  . CYST REMOVAL HAND    . WISDOM TOOTH EXTRACTION  2016    Family Psychiatric History: see below  Family History:  Family History  Problem Relation Age of Onset  . Anxiety disorder Mother   . Depression Mother   . Depression Father   . Anxiety disorder Brother   . Depression Brother   . Depression Paternal Aunt   . Bipolar disorder Other     Social History:  Social History   Socioeconomic History  . Marital status: Single    Spouse name: Not on file  . Number of children: Not on file  . Years of education: Not on file  . Highest education level: Not on file  Occupational History  . Not on file  Social Needs  . Financial resource strain: Not on file  . Food insecurity    Worry: Not on file    Inability: Not on file  . Transportation needs    Medical: Not on file    Non-medical: Not on file  Tobacco Use  . Smoking status: Never Smoker  . Smokeless tobacco: Never Used  Substance and Sexual Activity  . Alcohol use: No  . Drug use: No  . Sexual activity: Never  Lifestyle  . Physical activity    Days per week: Not on  file    Minutes per session: Not on file  . Stress: Not on file  Relationships  . Social Musicianconnections    Talks on phone: Not on file    Gets together: Not on file    Attends religious service: Not on file    Active member of club or organization: Not on file    Attends meetings of clubs or organizations: Not on file    Relationship status: Not on file  Other Topics Concern  . Not on file  Social History Narrative  . Not on file    Allergies:  Allergies  Allergen Reactions  . Cefzil [Cefprozil] Nausea And Vomiting    Vomiting (as an baby)  . Orange Oil Hives  . Trimox [Amoxicillin] Nausea And Vomiting    Vomiting (as an baby)    Metabolic Disorder Labs: No results found for: HGBA1C, MPG No results found for: PROLACTIN No results found for: CHOL, TRIG, HDL, CHOLHDL, VLDL, LDLCALC No results found for: TSH  Therapeutic Level Labs: No results found for: LITHIUM No results found for: VALPROATE No components found for:  CBMZ  Current Medications: Current Outpatient Medications  Medication Sig Dispense Refill  . acetaminophen (TYLENOL) 500 MG tablet Take 1 tablet (500 mg total) by mouth every 6 (six) hours as needed. 20 tablet 0  . albuterol (PROVENTIL HFA;VENTOLIN HFA) 108 (90 Base) MCG/ACT inhaler Inhale 2 puffs into the lungs every 6 (six) hours as needed for wheezing. 1 Inhaler 2  . escitalopram (LEXAPRO) 20 MG tablet Take 1 tablet (20 mg total) by mouth 2 (two) times a day. 180 tablet 2  . etodolac (LODINE) 400 MG tablet Take 1 tablet (400 mg total) by mouth 2 (two) times daily. 30 tablet 0  . ibuprofen (ADVIL) 400 MG tablet Take 1 tablet (400 mg total) by mouth every 6 (six) hours as needed. 20 tablet 0  . lidocaine (LIDODERM) 5 % Place 1 patch onto the skin daily. Remove & Discard patch within 12 hours or as directed by MD 30 patch 0  . Melatonin 10 MG TABS Take by mouth at bedtime as needed.    . Multiple Vitamin (MULTIVITAMIN) tablet Take 1 tablet by mouth at bedtime.      . Norethindrone-Ethinyl Estradiol-Fe Biphas (LO LOESTRIN FE) 1 MG-10 MCG / 10 MCG tablet Take 1 tablet by mouth daily. 1 Package 11  . ondansetron (ZOFRAN ODT) 4 MG disintegrating tablet Take 1 tablet (4 mg total) by mouth every 6 (six) hours as needed for nausea or vomiting. 20 tablet 0   No current facility-administered medications for this visit.      Musculoskeletal: Strength & Muscle Tone: within normal limits Gait & Station: normal Patient leans: N/A  Psychiatric Specialty Exam: Review of Systems  Musculoskeletal: Positive for myalgias.  Psychiatric/Behavioral: The patient is nervous/anxious.     Last menstrual period 09/06/2018.There is no height or weight on file to calculate BMI.  General Appearance: Casual and Fairly Groomed  Eye Contact:  Good  Speech:  Clear and Coherent  Volume:  Normal  Mood:  Anxious  Affect:  Appropriate and Congruent  Thought Process:  Goal Directed  Orientation:  Full (Time, Place, and Person)  Thought Content: Rumination   Suicidal Thoughts:  No  Homicidal Thoughts:  No  Memory:  Immediate;   Good Recent;   Good Remote;   Good  Judgement:  Good  Insight:  Fair  Psychomotor Activity:  Decreased  Concentration:  Concentration: Good and Attention Span: Good  Recall:  Good  Fund of Knowledge: Good  Language: Good  Akathisia:  No  Handed:  Right  AIMS (if indicated): not done  Assets:  Communication Skills Desire for Improvement Physical Health Resilience Social Support Talents/Skills Vocational/Educational  ADL's:  Intact  Cognition: WNL  Sleep:  Fair   Screenings: GAD-7     [facility]  Total GAD-7 Score  16       Assessment and Plan: This patient is a 18 year old white female with a history of depression and primarily anxiety.  Her anxiety is gotten worse  over conflicts with her publisher.  She seems to be settling down now.  I have increased Lexapro 20 mg twice daily over  the past week so we will see how this goes.  She will return to see me in 4 weeks   Levonne Spiller, MD 10/04/2018, 10:38 AM

## 2018-10-18 ENCOUNTER — Other Ambulatory Visit: Payer: Self-pay

## 2018-10-18 ENCOUNTER — Ambulatory Visit (INDEPENDENT_AMBULATORY_CARE_PROVIDER_SITE_OTHER): Payer: PRIVATE HEALTH INSURANCE | Admitting: Licensed Clinical Social Worker

## 2018-10-18 ENCOUNTER — Encounter (HOSPITAL_COMMUNITY): Payer: Self-pay | Admitting: Licensed Clinical Social Worker

## 2018-10-18 DIAGNOSIS — F411 Generalized anxiety disorder: Secondary | ICD-10-CM | POA: Diagnosis not present

## 2018-10-18 NOTE — Progress Notes (Signed)
Virtual Visit via Video Note  I connected with Renee Savage on 10/18/18 at  8:00 AM EDT by a video enabled telemedicine application and verified that I am speaking with the correct person using two identifiers.   I discussed the limitations of evaluation and management by telemedicine and the availability of in person appointments. The patient expressed understanding and agreed to proceed.  Participation Level: Active  Behavioral Response: CasualAlertAnxious  Type of Therapy: Individual Therapy  Treatment Goals addressed: Coping  Interventions: CBT and Solution Focused  Summary: Renee Savage is a 18 y.o. female who presents oriented x5 (person, place, situation, time, and object), casually dressed, appropriately groomed, average height, slender, and cooperative to address anxiety. Patient has a  History of medical treatment including diet for what was thought to be a gluten intolerance, GI issues and cyst on her hand. Patient has a history of mental health treatment including medication management. Patient denies suicidal and homicidal ideations. Patient denies substance abuse. Patient is at low risk for lethality at this time.  Patient was stressed. Patient had difficulty with her publisher and will no longer be working with them. Patient's mother was diagnosed with fibro myalgia. She feels like she is taking care of the house hold. Patient has had an increase in panic attacks. Patient has been having a difficult time with things. After discussion, patient was able to identify that she uses support from friends, deep breathing, and finding the root of her anxiety. Patient identified the thing she needs the most right now is support.   Patient engaged in session. She responded well to interventions. Patient continues to meet criteria for Generalized Anxiety Disorder. Patient will continue in outpatient therapy due to being the least restrictive service to meet her needs at this time.  Patient made minimal progress on her goals.   Suicidal/Homicidal: Negativewithout intent/plan  Therapist Response: Therapist reviewed patient's recent thoughts and behaviors. Therapist utilized CBT to address anxiety. Therapist completed treatment plan with patient. Therapist processed patient's feelings to identify triggers for anxiety. Therapist discussed with patient ways she manages panic attacks.   Plan: Return again in 3-4 weeks.  Diagnosis: Axis I: Generalized Anxiety Disorder    Axis II: No diagnosis   I discussed the assessment and treatment plan with the patient. The patient was provided an opportunity to ask questions and all were answered. The patient agreed with the plan and demonstrated an understanding of the instructions.   The patient was advised to call back or seek an in-person evaluation if the symptoms worsen or if the condition fails to improve as anticipated.  I provided 45 minutes of non-face-to-face time during this encounter.   Glori Bickers, LCSW 10/18/2018

## 2018-10-20 ENCOUNTER — Other Ambulatory Visit: Payer: Self-pay | Admitting: Family Medicine

## 2018-11-03 ENCOUNTER — Other Ambulatory Visit: Payer: Self-pay

## 2018-11-03 ENCOUNTER — Ambulatory Visit (INDEPENDENT_AMBULATORY_CARE_PROVIDER_SITE_OTHER): Payer: PRIVATE HEALTH INSURANCE | Admitting: Psychiatry

## 2018-11-03 ENCOUNTER — Encounter (HOSPITAL_COMMUNITY): Payer: Self-pay | Admitting: Psychiatry

## 2018-11-03 DIAGNOSIS — F411 Generalized anxiety disorder: Secondary | ICD-10-CM

## 2018-11-03 MED ORDER — ESCITALOPRAM OXALATE 20 MG PO TABS: 20.0000 mg | ORAL_TABLET | Freq: Two times a day (BID) | ORAL | 2 refills | Status: DC

## 2018-11-03 NOTE — Progress Notes (Signed)
Virtual Visit via Video Note  I connected with Renee Basemanatherine A Savage on 11/03/18 at  2:00 PM EDT by a video enabled telemedicine application and verified that I am speaking with the correct person using two identifiers.   I discussed the limitations of evaluation and management by telemedicine and the availability of in person appointments. The patient expressed understanding and agreed to proceed.    I discussed the assessment and treatment plan with the patient. The patient was provided an opportunity to ask questions and all were answered. The patient agreed with the plan and demonstrated an understanding of the instructions.   The patient was advised to call back or seek an in-person evaluation if the symptoms worsen or if the condition fails to improve as anticipated.  I provided 15 minutes of non-face-to-face time during this encounter.   Renee Savage Renee Puzio, MD  [facility] MD/PA/NP OP Progress Note  11/03/2018 2:20 PM Renee BasemanCatherine A Tews  MRN:  [id number]  Chief Complaint:  Chief Complaint    Depression; Anxiety; Follow-up     HPI: This patient is a18 year old single white female who lives at home with her parents and 20 year old brother. 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  The patient was referred by her primary physician, Dr. Lilyan PuntScott Luking for further assessment and treatment of anxiety.  The patient states that she has been anxious for a number of years. Some of this started in middle school because she was being bullied. She also has a very contentious relationship with her father. She states that he tries to control everyone in the family but particularly her. They are always butting heads. She states that he comes from a "military family "and tends to be harsh. He is very critical of her appearance, her relationships, her major in school. He is also constantly criticizing little things  like the timing of when she pays her tuition, how she does things around the house etc. By her report he is very harsh and judgmental. He pushed her constantly to go into the same feel that he is-speech therapy even though she does not want to. He is very against her living on campus because of the cost.  The patient states over the last few months she has been extremely irritable, crying at the drop of a hat anxious and worried. She tends to obsess about things and expect the worst. She is very self-critical. She was dating a boy last fall and he broke up with her in December and this caused some of the symptoms to intensify. She has never had previous psychiatric treatment because her father does not believe in mental health treatment. She does have a lot of GI issues such as stomachaches and diarrhea which Dr. Gerda DissLuking thinks are related to stress. She is not able to sleep well and is constantly wakes up through the night. She has had some panic attacks particularly during arguments with dad. She denies being suicidal or engaging in any form of self-harm. She does not use alcohol drugs cigarettes or vaping. She is never been sexually active. She and her mother are very close and she is also close to her brother. She seems extremely managed in the family, trying to protect her brother from the father and also trying to make up for her mother's chronic headaches by being a "second mom"to brother who picks him up at school etc  The patient returns for  follow-up after 1 month.  She had a difficult time last month and I had increased her Lexapro to 20 mg twice daily.  She states now she is doing better.  Her mood has improved she has less anxiety and she is no longer physically sick.  She is generally sleeping well.  She is getting along fairly well with her family.  And she did well in her college courses this summer.  She is going to continue online courses because of the coronavirus hazard in the  fall. Visit Diagnosis:    ICD-10-CM   1. Generalized anxiety disorder  F41.1     Past Psychiatric History:none  Past Medical History:  Past Medical History:  Diagnosis Date  . Allergy    Chronic  . Angio-edema   . Anxiety   . Asthma   . Beta thalassemia (HCC)   . Eczema   . IBS (irritable bowel syndrome)   . Reflux   . Urticaria     Past Surgical History:  Procedure Laterality Date  . CYST REMOVAL HAND    . WISDOM TOOTH EXTRACTION  2016    Family Psychiatric History: see below  Family History:  Family History  Problem Relation Age of Onset  . Anxiety disorder Mother   . Depression Mother   . Depression Father   . Anxiety disorder Brother   . Depression Brother   . Depression Paternal Aunt   . Bipolar disorder Other     Social History:  Social History   Socioeconomic History  . Marital status: Single    Spouse name: Not on file  . Number of children: Not on file  . Years of education: Not on file  . Highest education level: Not on file  Occupational History  . Not on file  Social Needs  . Financial resource strain: Not on file  . Food insecurity    Worry: Not on file    Inability: Not on file  . Transportation needs    Medical: Not on file    Non-medical: Not on file  Tobacco Use  . Smoking status: Never Smoker  . Smokeless tobacco: Never Used  Substance and Sexual Activity  . Alcohol use: No  . Drug use: No  . Sexual activity: Never  Lifestyle  . Physical activity    Days per week: Not on file    Minutes per session: Not on file  . Stress: Not on file  Relationships  . Social Musicianconnections    Talks on phone: Not on file    Gets together: Not on file    Attends religious service: Not on file    Active member of club or organization: Not on file    Attends meetings of clubs or organizations: Not on file    Relationship status: Not on file  Other Topics Concern  . Not on file  Social History Narrative  . Not on file    Allergies:   Allergies  Allergen Reactions  . Cefzil [Cefprozil] Nausea And Vomiting    Vomiting (as an baby)  . Orange Oil Hives  . Trimox [Amoxicillin] Nausea And Vomiting    Vomiting (as an baby)    Metabolic Disorder Labs: No results found for: HGBA1C, MPG No results found for: PROLACTIN No results found for: CHOL, TRIG, HDL, CHOLHDL, VLDL, LDLCALC No results found for: TSH  Therapeutic Level Labs: No results found for: LITHIUM No results found for: VALPROATE No components found for:  CBMZ  Current Medications: Current  Outpatient Medications  Medication Sig Dispense Refill  . acetaminophen (TYLENOL) 500 MG tablet Take 1 tablet (500 mg total) by mouth every 6 (six) hours as needed. 20 tablet 0  . albuterol (PROVENTIL HFA;VENTOLIN HFA) 108 (90 Base) MCG/ACT inhaler Inhale 2 puffs into the lungs every 6 (six) hours as needed for wheezing. 1 Inhaler 2  . escitalopram (LEXAPRO) 20 MG tablet Take 1 tablet (20 mg total) by mouth 2 (two) times a day. 180 tablet 2  . etodolac (LODINE) 400 MG tablet Take 1 tablet (400 mg total) by mouth 2 (two) times daily. 30 tablet 0  . ibuprofen (ADVIL) 400 MG tablet Take 1 tablet (400 mg total) by mouth every 6 (six) hours as needed. 20 tablet 0  . lidocaine (LIDODERM) 5 % Place 1 patch onto the skin daily. Remove & Discard patch within 12 hours or as directed by MD 30 patch 0  . LO LOESTRIN FE 1 MG-10 MCG / 10 MCG tablet TAKE 1 TABLET BY MOUTH  EVERY DAY 84 tablet 0  . Melatonin 10 MG TABS Take by mouth at bedtime as needed.    . Multiple Vitamin (MULTIVITAMIN) tablet Take 1 tablet by mouth at bedtime.     . ondansetron (ZOFRAN ODT) 4 MG disintegrating tablet Take 1 tablet (4 mg total) by mouth every 6 (six) hours as needed for nausea or vomiting. 20 tablet 0   No current facility-administered medications for this visit.      Musculoskeletal: Strength & Muscle Tone: within normal limits Gait & Station: normal Patient leans: N/A  Psychiatric Specialty  Exam: Review of Systems  All other systems reviewed and are negative.   There were no vitals taken for this visit.There is no height or weight on file to calculate BMI.  General Appearance: Casual and Fairly Groomed  Eye Contact:  Good  Speech:  Clear and Coherent  Volume:  Normal  Mood:  Euthymic  Affect:  Appropriate and Congruent  Thought Process:  Goal Directed  Orientation:  Full (Time, Place, and Person)  Thought Content: Rumination   Suicidal Thoughts:  No  Homicidal Thoughts:  No  Memory:  Immediate;   Good Recent;   Good Remote;   Good  Judgement:  Good  Insight:  Good  Psychomotor Activity:  Normal  Concentration:  Concentration: Good and Attention Span: Good  Recall:  Good  Fund of Knowledge: Good  Language: Good  Akathisia:  No  Handed:  Right  AIMS (if indicated): not done  Assets:  Communication Skills Desire for Improvement Physical Health Resilience Social Support Talents/Skills  ADL's:  Intact  Cognition: WNL  Sleep:  Good   Screenings: GAD-7     Office Visit from 10/16/2017 in [facility]  Total GAD-7 Score  16       Assessment and Plan:  This patient is a 18 year old female with a history of depression but primarily anxiety.  She is often embroiled in family conflicts but for now things a seem to have settled down.  She will continue Lexapro 20 mg twice daily.  She will return to see me in 2 months  Levonne Spiller, MD 11/03/2018, 2:20 PM

## 2018-11-05 ENCOUNTER — Other Ambulatory Visit: Payer: Self-pay

## 2018-11-05 ENCOUNTER — Ambulatory Visit (INDEPENDENT_AMBULATORY_CARE_PROVIDER_SITE_OTHER): Payer: Managed Care, Other (non HMO) | Admitting: Nurse Practitioner

## 2018-11-05 ENCOUNTER — Encounter: Payer: Self-pay | Admitting: Nurse Practitioner

## 2018-11-05 VITALS — BP 102/70 | Temp 98.3°F | Ht 62.25 in | Wt 131.2 lb

## 2018-11-05 DIAGNOSIS — Z01419 Encounter for gynecological examination (general) (routine) without abnormal findings: Secondary | ICD-10-CM

## 2018-11-05 NOTE — Progress Notes (Signed)
Subjective:    Patient ID: Renee Savage, female    DOB: Nov 22, 2000, 18 y.o.   MRN: [id number]  HPI  The patient comes in today for a wellness visit. She states she is mildly anxious and has had multiple panic attacks in the past two months.  She is being seen by psychiatry for her anxiety.  No additional complaints today.    A review of their health history was completed. A review of medications was also completed.  Any needed refills; no  Social: Live at home with parents and brother.  Feeling lots of stress with needing to drive family around. Denies alcohol, tobacco, drug use.  Eating habits: not eating well- snacking and stress eating  Falls/  MVA accidents in past few months: none  Regular exercise: walking with dogs, errands  Sexual: No sexual intercourse or sexual partners.  Specialist pt sees on regular basis: ortho as needed  Preventative health issues were discussed.   Additional concerns: weight gain from stress eating  LMP 7/29 - 2-4 days long, cycles regular, light flow since starting oral contraceptives.  Review of Systems  Constitutional: Positive for fatigue. Negative for activity change and appetite change.  HENT: Negative for dental problem, ear pain, sinus pressure and sore throat.   Respiratory: Negative for cough, chest tightness, shortness of breath and wheezing.   Cardiovascular: Negative for chest pain.  Gastrointestinal: Negative for abdominal distention, abdominal pain, nausea and vomiting.       Some constipation and diarrhea at times especially with stress.   Genitourinary: Negative for difficulty urinating, dysuria, enuresis, frequency, genital sores, menstrual problem, pelvic pain, urgency and vaginal discharge.   Dentist every six months, last exam was yesterday.  Eye exam was last August 2019.   Depression screen PHQ 2/9 11/05/2018  Decreased Interest 1  Down, Depressed, Hopeless 0  PHQ - 2 Score 1  Altered sleeping 3  Tired,  decreased energy 1  Change in appetite 2  Feeling bad or failure about yourself  3  Trouble concentrating 2  Moving slowly or fidgety/restless 1  Suicidal thoughts 0  PHQ-9 Score 13  Difficult doing work/chores Somewhat difficult       Objective:   Physical Exam Constitutional:      General: She is not in acute distress.    Appearance: She is well-developed.  HENT:     Right Ear: External ear normal.     Left Ear: External ear normal.  Neck:     Musculoskeletal: Normal range of motion and neck supple.     Thyroid: No thyromegaly.     Trachea: No tracheal deviation.  Cardiovascular:     Rate and Rhythm: Normal rate and regular rhythm.     Heart sounds: Normal heart sounds. No murmur. No gallop.   Pulmonary:     Effort: Pulmonary effort is normal.     Breath sounds: Normal breath sounds.  Chest:     Breasts:        Right: Normal. No inverted nipple, mass, nipple discharge or tenderness.        Left: Normal. No inverted nipple, mass, nipple discharge or tenderness.  Abdominal:     General: There is no distension.     Palpations: Abdomen is soft.     Tenderness: There is no abdominal tenderness.  Genitourinary:    Vagina: Normal.     Comments: Defers pelvic exam; denies any problems.  Lymphadenopathy:     Cervical: No cervical adenopathy.  Upper Body:     Right upper body: No axillary adenopathy.     Left upper body: No axillary adenopathy.  Skin:    General: Skin is warm and dry.     Findings: No rash.  Neurological:     Mental Status: She is alert and oriented to person, place, and time.  Psychiatric:        Mood and Affect: Mood is anxious.        Speech: Speech normal.        Behavior: Behavior normal. Behavior is cooperative.        Thought Content: Thought content normal.        Cognition and Memory: Cognition normal.   Gynecologic: No complaints. Defer examination.     Today's Vitals   11/05/18 1438  BP: 102/70  Temp: 98.3 F (36.8 C)  TempSrc:  Oral  Weight: 131 lb 3.2 oz (59.5 kg)  Height: 5' 2.25" (1.581 m)   Body mass index is 23.8 kg/m.      Assessment & Plan:   Problem List Items Addressed This Visit    None    Visit Diagnoses    Well woman exam    -  Primary     Education on importance of increasing exercise and maintaining healthy eating habits.  Discussed safe sexual practices should she become sexually active.  No medication refills needed.  Discussed coping mechanisms for panic attacks. Patient sees psychiatry as well.  Return in about 1 year (around 11/05/2019) for physical.

## 2018-11-06 ENCOUNTER — Encounter: Payer: Self-pay | Admitting: Nurse Practitioner

## 2018-12-02 ENCOUNTER — Ambulatory Visit (INDEPENDENT_AMBULATORY_CARE_PROVIDER_SITE_OTHER): Payer: PRIVATE HEALTH INSURANCE | Admitting: Psychiatry

## 2018-12-02 ENCOUNTER — Other Ambulatory Visit: Payer: Self-pay

## 2018-12-02 ENCOUNTER — Encounter (HOSPITAL_COMMUNITY): Payer: Self-pay | Admitting: Psychiatry

## 2018-12-02 DIAGNOSIS — F411 Generalized anxiety disorder: Secondary | ICD-10-CM

## 2018-12-02 NOTE — Progress Notes (Signed)
Virtual Visit via Video Note  I connected with Renee Savage on 12/02/18 at 10:00 AM EDT by a video enabled telemedicine application and verified that I am speaking with the correct person using two identifiers.   I discussed the limitations of evaluation and management by telemedicine and the availability of in person appointments. The patient expressed understanding and agreed to proceed.     I discussed the assessment and treatment plan with the patient. The patient was provided an opportunity to ask questions and all were answered. The patient agreed with the plan and demonstrated an understanding of the instructions.   The patient was advised to call back or seek an in-person evaluation if the symptoms worsen or if the condition fails to improve as anticipated.  I provided 15 minutes of non-face-to-face time during this encounter.   [name], MD  [facility] MD/PA/NP OP Progress Note  12/02/2018 10:20 AM Renee Savage  MRN:  [id number]  Chief Complaint:  Chief Complaint    Depression; Anxiety; Follow-up     HPI: This patient is a18 year old single white female who lives at home with her parents and 22 year old brother. 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  The patient was referred by her primary physician, [name] for further assessment and treatment of anxiety.  The patient states that she has been anxious for a number of years. Some of this started in middle school because she was being bullied. She also has a very contentious relationship with her father. She states that he tries to control everyone in the family but particularly her. They are always butting heads. She states that he comes from a "military family "and tends to be harsh. He is very critical of her appearance, her relationships, her major in school. He is also constantly criticizing little things like the timing of when she pays her tuition, how she does things around the house etc.  By her report he is very harsh and judgmental. He pushed her constantly to go into the same feel that he is-speech therapy even though she does not want to. He is very against her living on campus because of the cost.  The patient states over the last few months she has been extremely irritable, crying at the drop of a hat anxious and worried. She tends to obsess about things and expect the worst. She is very self-critical. She was dating a boy last fall and he broke up with her in December and this caused some of the symptoms to intensify. She has never had previous psychiatric treatment because her father does not believe in mental health treatment. She does have a lot of GI issues such as stomachaches and diarrhea which Dr. [name] thinks are related to stress. She is not able to sleep well and is constantly wakes up through the night. She has had some panic attacks particularly during arguments with dad. She denies being suicidal or engaging in any form of self-harm. She does not use alcohol drugs cigarettes or vaping. She is never been sexually active. She and her mother are very close and she is also close to her brother. She seems extremely managed in the family, trying to protect her brother from the father and also trying to make up for her mother's chronic headaches by being a "second mom"to brother who picks him up at school etc  The patient returns for follow-up after 4 weeks.  She is started her semester and is taking off  her classes online.  So far this is going well for the most part.  She is still helping her mother out a good deal.  The mother has fibromyalgia and cannot always do much around the house.  The patient is doing a lot of the housework etc.  She is somewhat stressed because her parents still tend to argue a lot.  She states that her father is frustrated with her mother's inability to do a lot due to fibromyalgia.  Overall however she states that her mood is generally  pretty good.  Her sleep is somewhat interrupted so I suggested she go back to the melatonin.  She denies any depression or suicidal ideation.   Visit Diagnosis:    ICD-10-CM   1. Generalized anxiety disorder  F41.1     Past Psychiatric History: none  Past Medical History:  Past Medical History:  Diagnosis Date  . Allergy    Chronic  . Angio-edema   . Anxiety   . Asthma   . Beta thalassemia (HCC)   . Eczema   . IBS (irritable bowel syndrome)   . Reflux   . Urticaria     Past Surgical History:  Procedure Laterality Date  . CYST REMOVAL HAND    . WISDOM TOOTH EXTRACTION  2016    Family Psychiatric History: see below  Family History:  Family History  Problem Relation Age of Onset  . Anxiety disorder Mother   . Depression Mother   . Depression Father   . Anxiety disorder Brother   . Depression Brother   . Depression Paternal Aunt   . Bipolar disorder Other     Social History:  Social History   Socioeconomic History  . Marital status: Single    Spouse name: Not on file  . Number of children: Not on file  . Years of education: Not on file  . Highest education level: Not on file  Occupational History  . Not on file  Social Needs  . Financial resource strain: Not on file  . Food insecurity    Worry: Not on file    Inability: Not on file  . Transportation needs    Medical: Not on file    Non-medical: Not on file  Tobacco Use  . Smoking status: Never Smoker  . Smokeless tobacco: Never Used  Substance and Sexual Activity  . Alcohol use: No  . Drug use: No  . Sexual activity: Never  Lifestyle  . Physical activity    Days per week: Not on file    Minutes per session: Not on file  . Stress: Not on file  Relationships  . Social Musicianconnections    Talks on phone: Not on file    Gets together: Not on file    Attends religious service: Not on file    Active member of club or organization: Not on file    Attends meetings of clubs or organizations: Not on file     Relationship status: Not on file  Other Topics Concern  . Not on file  Social History Narrative  . Not on file    Allergies:  Allergies  Allergen Reactions  . Cefzil [Cefprozil] Nausea And Vomiting    Vomiting (as an baby)  . Orange Oil Hives  . Trimox [Amoxicillin] Nausea And Vomiting    Vomiting (as an baby)    Metabolic Disorder Labs: No results found for: HGBA1C, MPG No results found for: PROLACTIN No results found for: CHOL, TRIG, HDL, CHOLHDL, VLDL,  Burgin No results found for: TSH  Therapeutic Level Labs: No results found for: LITHIUM No results found for: VALPROATE No components found for:  CBMZ  Current Medications: Current Outpatient Medications  Medication Sig Dispense Refill  . acetaminophen (TYLENOL) 500 MG tablet Take 1 tablet (500 mg total) by mouth every 6 (six) hours as needed. 20 tablet 0  . albuterol (PROVENTIL HFA;VENTOLIN HFA) 108 (90 Base) MCG/ACT inhaler Inhale 2 puffs into the lungs every 6 (six) hours as needed for wheezing. 1 Inhaler 2  . escitalopram (LEXAPRO) 20 MG tablet Take 1 tablet (20 mg total) by mouth 2 (two) times a day. 180 tablet 2  . etodolac (LODINE) 400 MG tablet Take 1 tablet (400 mg total) by mouth 2 (two) times daily. 30 tablet 0  . ibuprofen (ADVIL) 400 MG tablet Take 1 tablet (400 mg total) by mouth every 6 (six) hours as needed. 20 tablet 0  . lidocaine (LIDODERM) 5 % Place 1 patch onto the skin daily. Remove & Discard patch within 12 hours or as directed by MD 30 patch 0  . LO LOESTRIN FE 1 MG-10 MCG / 10 MCG tablet TAKE 1 TABLET BY MOUTH  EVERY DAY 84 tablet 0  . Melatonin 10 MG TABS Take by mouth at bedtime as needed.    . Multiple Vitamin (MULTIVITAMIN) tablet Take 1 tablet by mouth at bedtime.     . ondansetron (ZOFRAN ODT) 4 MG disintegrating tablet Take 1 tablet (4 mg total) by mouth every 6 (six) hours as needed for nausea or vomiting. 20 tablet 0   No current facility-administered medications for this visit.       Musculoskeletal: Strength & Muscle Tone: within normal limits Gait & Station: normal Patient leans: N/A  Psychiatric Specialty Exam: Review of Systems  All other systems reviewed and are negative.   There were no vitals taken for this visit.There is no height or weight on file to calculate BMI.  General Appearance: Casual and Fairly Groomed  Eye Contact:  Good  Speech:  Clear and Coherent  Volume:  Normal  Mood:  Euthymic  Affect:  Appropriate and Congruent  Thought Process:  Goal Directed  Orientation:  Full (Time, Place, and Person)  Thought Content: Rumination   Suicidal Thoughts:  No  Homicidal Thoughts:  No  Memory:  Immediate;   Good Recent;   Good Remote;   Good  Judgement:  Good  Insight:  Good  Psychomotor Activity:  Normal  Concentration:  Concentration: Good and Attention Span: Good  Recall:  Good  Fund of Knowledge: Good  Language: Good  Akathisia:  No  Handed:  Right  AIMS (if indicated): not done  Assets:  Communication Skills Desire for Improvement Physical Health Resilience Social Support Talents/Skills  ADL's:  Intact  Cognition: WNL  Sleep:  Fair   Screenings: GAD-7     Office Visit from 10/16/2017 in [place]  Total GAD-7 Score  16    PHQ2-9     Office Visit from 11/05/2018 in [place]  PHQ-2 Total Score  1  PHQ-9 Total Score  13       Assessment and Plan: This patient is a 18 year old female with a history of depression) but primarily anxiety.  She is often in the middle of the conflict between her parents and she is trying to set limits with this.  Overall she is doing well and will continue Lexapro 20 mg twice daily for depression and anxiety.  She will  return to see me in 6 weeks   [name] Eleina Jergens, MD 12/02/2018, 10:20 AM

## 2018-12-29 ENCOUNTER — Ambulatory Visit (INDEPENDENT_AMBULATORY_CARE_PROVIDER_SITE_OTHER): Payer: Managed Care, Other (non HMO) | Admitting: Family Medicine

## 2018-12-29 ENCOUNTER — Encounter: Payer: Self-pay | Admitting: Family Medicine

## 2018-12-29 DIAGNOSIS — S83006D Unspecified dislocation of unspecified patella, subsequent encounter: Secondary | ICD-10-CM

## 2018-12-29 DIAGNOSIS — M25561 Pain in right knee: Secondary | ICD-10-CM | POA: Diagnosis not present

## 2018-12-29 NOTE — Progress Notes (Signed)
   Subjective:  Audio plus video  Patient ID: Renee Savage, female    DOB: 10/28/2000, 18 y.o.   MRN: [id number]  Knee Pain  Incident onset: pt has had knee issues since November 2015; recent epsiode started about 5 days. The injury mechanism was a fall (knee popped out and pt fell). The pain is present in the left knee. She has tried heat, ice and elevation (PT saw at original incidence ) for the symptoms.   Virtual Visit via Video Note  I connected with Renee Savage on 12/29/18 at 11:00 AM EDT by a video enabled telemedicine application and verified that I am speaking with the correct person using two identifiers.  Location: Patient: home Provider: office    I discussed the limitations of evaluation and management by telemedicine and the availability of in person appointments. The patient expressed understanding and agreed to proceed.  History of Present Illness:    Observations/Objective:   Assessment and Plan:   Follow Up Instructions:    I discussed the assessment and treatment plan with the patient. The patient was provided an opportunity to ask questions and all were answered. The patient agreed with the plan and demonstrated an understanding of the instructions.   The patient was advised to call back or seek an in-person evaluation if the symptoms worsen or if the condition fails to improve as anticipated.  I provided 18 minutes of non-face-to-face time during this encounter.  Review of old records reveals prior patellar dislocation.  Patient went through protracted physical therapy.  This was 4 years ago.  On further history still has times where her patella pops out of place.  At one point this spring required some assistance getting it back in.  Now occurring with rather trivial activity as it did yesterday  Anti-inflammatory medicine taking as needed. Vicente Males, LPN    Review of Systems No chest pain no shortness of breath no abdominal pain     Objective:   Physical Exam   Virtual     Assessment & Plan:  Impression recurrent patellar dislocation.  Discussion held.  Time for visit back with orthopedics.  May well need procedure discussed

## 2019-01-07 ENCOUNTER — Other Ambulatory Visit: Payer: Self-pay | Admitting: Family Medicine

## 2019-01-12 ENCOUNTER — Other Ambulatory Visit: Payer: Self-pay

## 2019-01-12 ENCOUNTER — Other Ambulatory Visit (INDEPENDENT_AMBULATORY_CARE_PROVIDER_SITE_OTHER): Payer: Managed Care, Other (non HMO) | Admitting: *Deleted

## 2019-01-12 DIAGNOSIS — Z23 Encounter for immunization: Secondary | ICD-10-CM | POA: Diagnosis not present

## 2019-01-24 ENCOUNTER — Other Ambulatory Visit: Payer: Self-pay

## 2019-01-24 ENCOUNTER — Encounter: Payer: Self-pay | Admitting: Family Medicine

## 2019-01-24 ENCOUNTER — Ambulatory Visit (INDEPENDENT_AMBULATORY_CARE_PROVIDER_SITE_OTHER): Payer: Managed Care, Other (non HMO) | Admitting: Family Medicine

## 2019-01-24 VITALS — Temp 98.1°F | Ht 62.25 in | Wt 137.6 lb

## 2019-01-24 DIAGNOSIS — R070 Pain in throat: Secondary | ICD-10-CM

## 2019-01-24 DIAGNOSIS — S1093XA Contusion of unspecified part of neck, initial encounter: Secondary | ICD-10-CM

## 2019-01-24 MED ORDER — DICLOFENAC SODIUM 75 MG PO TBEC: 75.0000 mg | DELAYED_RELEASE_TABLET | Freq: Two times a day (BID) | ORAL | 0 refills | Status: DC

## 2019-01-24 NOTE — Progress Notes (Signed)
   Subjective:    Patient ID: Renee Savage, female    DOB: Dec 01, 2000, 18 y.o.   MRN: [id number]  HPI  Patient arrives with difficulty speaking and pain in throat after being kicked in the voice box by her yorkie.   Patient for her history took a hard shot to the neck.  Her dog literally kicked her in the throat.  Developed a lot of pain in the throat.  Having difficulty now with speaking loudly.  Some difficulty with swallowing also  Has taken occasional over-the-counter medications not helping much.  Patient feels that she cannot participate in her usual online school performance at this time Review of Systems No headache no chest pain no abdominal pain    Objective:   Physical Exam   Alert vitals stable, NAD. Blood pressure good on repeat. HEENT normal. Lungs clear. Heart regular rate and rhythm. Neck within normal limits.  And larynx normal some tenderness along the left lateral larynx.  Neck supple Impression contusion larynx.  Symptom care discussed anti-inflammatory medicine prescribed school excuse next several days    Assessment & Plan:

## 2019-01-25 ENCOUNTER — Ambulatory Visit (HOSPITAL_COMMUNITY): Payer: PRIVATE HEALTH INSURANCE | Admitting: Psychiatry

## 2019-02-24 DIAGNOSIS — M25369 Other instability, unspecified knee: Secondary | ICD-10-CM | POA: Insufficient documentation

## 2019-03-16 HISTORY — PX: KNEE SURGERY: SHX244

## 2019-03-24 ENCOUNTER — Other Ambulatory Visit: Payer: Self-pay

## 2019-03-24 ENCOUNTER — Encounter (HOSPITAL_COMMUNITY): Payer: Self-pay | Admitting: Physical Therapy

## 2019-03-24 ENCOUNTER — Ambulatory Visit (HOSPITAL_COMMUNITY): Payer: No Typology Code available for payment source | Attending: Orthopedic Surgery | Admitting: Physical Therapy

## 2019-03-24 DIAGNOSIS — M6281 Muscle weakness (generalized): Secondary | ICD-10-CM | POA: Insufficient documentation

## 2019-03-24 DIAGNOSIS — M25662 Stiffness of left knee, not elsewhere classified: Secondary | ICD-10-CM | POA: Diagnosis present

## 2019-03-24 DIAGNOSIS — R2689 Other abnormalities of gait and mobility: Secondary | ICD-10-CM | POA: Insufficient documentation

## 2019-03-24 DIAGNOSIS — M25562 Pain in left knee: Secondary | ICD-10-CM | POA: Insufficient documentation

## 2019-03-24 DIAGNOSIS — R600 Localized edema: Secondary | ICD-10-CM | POA: Diagnosis present

## 2019-03-24 NOTE — Therapy (Signed)
[place], [place], [address] Phone: [phone]   Fax:  [phone]  Physical Therapy Evaluation  Patient Details  Name: DARRIAN GOODWILL MRN: [id number] Date of Birth: [birth year] Referring Provider (PT): Victorino December, MD   Encounter Date: 03/24/2019  PT End of Session - 03/24/19 1552    Visit Number  1    Number of Visits  12    Date for PT Re-Evaluation  05/05/19    Authorization Type  Generic commercial (90 visit limit combined PT/OT/ST)    Authorization Time Period  03/24/19 - 05/05/19. Progress note on visit 10.    Authorization - Visit Number  1    Authorization - Number of Visits  10    PT Start Time  [phone]    PT Stop Time  1040    PT Time Calculation (min)  45 min    Equipment Utilized During Treatment  Left knee immobilizer    Activity Tolerance  Patient limited by pain    Behavior During Therapy  Anxious       Past Medical History:  Diagnosis Date  . Allergy    Chronic  . Angio-edema   . Anxiety   . Asthma   . Beta thalassemia ([facility])   . Eczema   . IBS (irritable bowel syndrome)   . Reflux   . Urticaria     Past Surgical History:  Procedure Laterality Date  . CYST REMOVAL HAND    . WISDOM TOOTH EXTRACTION  2016    There were no vitals filed for this visit.   Subjective Assessment - 03/24/19 1002    Subjective  Patient S/P Lt arthroscopic medial patellofemoral ligament reconstruction with hamstring allograft on 03/16/19. Patient reported that her brace is currently locked out to 0 degrees and she is supposed to wear it all the time. Patient reported MD said she could put some weight through the leg as able. She stated she is ambulating with crutches currently. She reported using ice at home.    Pertinent History  History of 5 years of dislocation prior to surgery    Limitations  Walking;Standing    How long can you sit comfortably?  Varies    How long can you stand comfortably?  5  minutes    How long can you walk comfortably?  5 minutes with crutches    Patient Stated Goals  To walk better and be able to bend knee    Currently in Pain?  Yes    Pain Score  4     Pain Location  Knee    Pain Orientation  Left    Pain Descriptors / Indicators  Aching    Pain Type  Surgical pain    Pain Radiating Towards  At times achilles tendon    Pain Onset  1 to 4 weeks ago    Pain Frequency  Constant    Aggravating Factors   Bending    Pain Relieving Factors  Medication    Effect of Pain on Daily Activities  Severely effects         [facility] PT Assessment - 03/24/19 0001      Assessment   Medical Diagnosis  S/P Lt arthroscopic medial patellofemoral ligament reconstruction with hamstring allograft    Referring Provider (PT)  Victorino December, MD    Onset Date/Surgical Date  03/16/19    Next MD Visit  03/28/19    Prior Therapy  Yes, 5 years ago  for knee pain      Precautions   Precautions  Knee    Required Braces or Orthoses  Knee Immobilizer - Left    Knee Immobilizer - Left  On at all times   locked at 0 degrees     Restrictions   Weight Bearing Restrictions  Yes    LLE Weight Bearing  Touchdown weight bearing   Per protocol     Balance Screen   Has the patient fallen in the past 6 months  Yes    How many times?  2    Has the patient had a decrease in activity level because of a fear of falling?   Yes    Is the patient reluctant to leave their home because of a fear of falling?   No      Home Public house manager residence    Living Arrangements  Parent    Type of Home  House    Home Access  Level entry    Home Layout  Two level;Able to live on main level with bedroom/bathroom    Home Equipment  Crutches      Prior Function   Level of Independence  Independent      Cognition   Overall Cognitive Status  Within Functional Limits for tasks assessed      Observation/Other Assessments   Observations  No noted excessive redness, no increased  warmth felt or any other signs of DVT or infection noted.     Focus on Therapeutic Outcomes (FOTO)   Perform next session      Observation/Other Assessments-Edema    Edema  Circumferential      Circumferential Edema   Circumferential - Right  37 cm at joint line knee    Circumferential - Left   39.5 cm at joint line knee      Sensation   Light Touch  Impaired by gross assessment    Additional Comments  Reported some numbness inferior to LT patella      ROM / Strength   AROM / PROM / Strength  AROM;Strength      AROM   AROM Assessment Site  Knee    Right/Left Knee  Left    Left Knee Extension  0    Left Knee Flexion  32      Strength   Overall Strength Comments  Limited testing due to protocol    Strength Assessment Site  Hip;Knee;Ankle    Right/Left Hip  Right;Left    Right Hip Flexion  5/5    Right/Left Knee  Right;Left    Right Knee Flexion  5/5    Right Knee Extension  5/5    Left Knee Flexion  --   Unable to test due to precautions   Left Knee Extension  --   Unable to test due to precautions   Right/Left Ankle  Right;Left    Right Ankle Dorsiflexion  5/5    Left Ankle Dorsiflexion  4+/5      Palpation   Palpation comment  No excessive warmth noted      Ambulation/Gait   Ambulation/Gait  Yes    Ambulation/Gait Assistance  5: Supervision    Ambulation Distance (Feet)  65 Feet    Assistive device  Crutches    Gait Pattern  Step-to pattern;Decreased hip/knee flexion - left;Trunk flexed    Ambulation Surface  Level;Indoor    Gait velocity  0.17 m/s     Gait Comments  Paused during 2 minutes. Trunk significantly flexed anteriorly.                 Objective measurements completed on examination: See above findings.              PT Education - 03/24/19 1551    Education Details  Discussed examination findings, POC, and RICE technique. Educated on proper use of crutches, not resting axilla on top of crutch and educated on adjusting  crutches so to improve mechanics.    Person(s) Educated  Patient    Methods  Explanation    Comprehension  Verbalized understanding       PT Short Term Goals - 03/24/19 1557      PT SHORT TERM GOAL #1   Title  Patient will report understanding and regular compliance with HEP to improve ROM, strength and overall mobility.    Time  3    Period  Weeks    Status  New    Target Date  04/14/19      PT SHORT TERM GOAL #2   Title  Within protocol, patient will demonstrate left knee extension/flexion active range of motion of at least 0-90 degrees to assist with more normalized gait pattern and stair ambulation.    Time  3    Period  Weeks    Status  New    Target Date  04/14/19      PT SHORT TERM GOAL #3   Title  --    Time  --    Period  --    Status  --    Target Date  --        PT Long Term Goals - 03/24/19 1554      PT LONG TERM GOAL #1   Title  Patient will demonstrate ability to ambulate at a gait velocity of at least 1.0 m/s with LRAD in order to improve safety with community ambulation.    Time  6    Period  Weeks    Status  New    Target Date  05/05/19      PT LONG TERM GOAL #2   Title  Within protocol, patient will demonstrate left knee extension/flexion active range of motion of at least 0-125 degrees to assist with more normalized gait pattern and stair ambulation.    Time  6    Period  Weeks    Status  New    Target Date  05/05/19      PT LONG TERM GOAL #3   Title  Patient will demonstrate left lower extremity strength within functional limits in order to return to daily activities safely.    Time  6    Period  Weeks    Status  New    Target Date  05/05/19      PT LONG TERM GOAL #4   Title  Patient will have circumferential measurement of left knee within 0.5-1 cm difference between the right knee indicating decreased edema for improved ease of mobility.    Time  6    Period  Weeks    Status  New    Target Date  05/05/19      PT LONG TERM GOAL #5    Title  Patient will report ability to ambulate for at least 20 minutes without pain greater than 3 out of 10 in order to perform community activities such as grocery shopping with improved ease.    Time  6  Period  Weeks    Status  New    Target Date  05/05/19             Plan - 03/24/19 1620    Clinical Impression Statement  Patient is a 18 year old female who presented to outpatient physical therapy S/P LT knee arthroscopic medial patellofemoral ligament reconstruction with hamstring allograft. Patient presented with postoperative deficits including decreased ROM of the left knee particularly in flexion, increased edema, decreased functional strength by observation, and decreased functional mobility including decreased gait velocity and noted gait deviations. Patient was noted to have significant trunk lean anteriorly with ambulation, and therapist educated patient on proper mechanics with crutches as well as how to adjust crutches, therapist attempted to adjust during session, but one side of the crutches required a tool to fix. Educated patient on continuing exercises provided at hospital, and to follow-up with MD regarding questions about bathing. Patient would benefit from continued skilled physical therapy in order to address the abovementioned deficits and help patient return to prior level of function.    Personal Factors and Comorbidities  Other   GAD   Examination-Activity Limitations  Bathing;Stand;Bed Mobility;Locomotion Level;Toileting;Bend;Transfers;Sit;Squat;Dressing;Hygiene/Grooming;Stairs    Examination-Participation Restrictions  Cleaning;Meal Prep;Community Activity;Driving;Shop;Laundry    Stability/Clinical Decision Making  Stable/Uncomplicated    Clinical Decision Making  Low    Rehab Potential  Fair    PT Frequency  2x / week    PT Duration  6 weeks    PT Treatment/Interventions  ADLs/Self Care Home Management;Cryotherapy;Electrical Stimulation;Moist Heat;DME  Instruction;Gait training;Stair training;Functional mobility training;Therapeutic activities;Therapeutic exercise;Balance training;Neuromuscular re-education;Patient/family education;Manual techniques;Passive range of motion;Dry needling;Energy conservation;Taping;Orthotic Fit/Training    PT Next Visit Plan  Perform FOTO, follow protocol faxed from [facility], manual therapy to reduce edema. Follow-up regarding MD appointment    PT Home Exercise Plan  Continue exercises provided at hospital, ICE/elevate    Consulted and Agree with Plan of Care  Patient       Patient will benefit from skilled therapeutic intervention in order to improve the following deficits and impairments:  Abnormal gait, Impaired sensation, Improper body mechanics, Pain, Decreased mobility, Decreased activity tolerance, Decreased endurance, Decreased range of motion, Decreased strength, Hypomobility, Decreased balance, Difficulty walking, Increased edema  Visit Diagnosis: Acute pain of left knee  Stiffness of left knee, not elsewhere classified  Localized edema  Muscle weakness (generalized)  Other abnormalities of gait and mobility     Problem List Patient Active Problem List   Diagnosis Date Noted  . Generalized anxiety disorder 11/09/2017  . Chronic abdominal pain 10/16/2017   Verne Carrow PT, DPT 4:32 PM, 03/24/19 [phone]  [facility] [address] [place], [place], [address] Phone: [phone]   Fax:  [phone]  Name: LUCINDY BOREL MRN: [id number] Date of Birth: January 04, 2001

## 2019-03-28 ENCOUNTER — Other Ambulatory Visit: Payer: Self-pay

## 2019-03-28 ENCOUNTER — Ambulatory Visit (HOSPITAL_COMMUNITY): Payer: No Typology Code available for payment source | Admitting: Physical Therapy

## 2019-03-28 DIAGNOSIS — M25562 Pain in left knee: Secondary | ICD-10-CM

## 2019-03-28 DIAGNOSIS — M6281 Muscle weakness (generalized): Secondary | ICD-10-CM

## 2019-03-28 DIAGNOSIS — R2689 Other abnormalities of gait and mobility: Secondary | ICD-10-CM

## 2019-03-28 DIAGNOSIS — R600 Localized edema: Secondary | ICD-10-CM

## 2019-03-28 DIAGNOSIS — M25662 Stiffness of left knee, not elsewhere classified: Secondary | ICD-10-CM

## 2019-03-28 NOTE — Therapy (Addendum)
[facility] [address] [place], [place], [address] Phone: [phone]   Fax:  [phone]  Physical Therapy Treatment  Patient Details  Name: Renee Savage MRN: [id number] Date of Birth: July 30, 2000 Referring Provider (PT): Duwayne Heck, MD   Encounter Date: 03/28/2019  PT End of Session - 03/28/19 1302    Visit Number  2    Number of Visits  12    Date for PT Re-Evaluation  05/05/19    Authorization Type  Generic commercial (90 visit limit combined PT/OT/ST)    Authorization Time Period  03/24/19 - 05/05/19. Progress note on visit 10.    Authorization - Visit Number  2    Authorization - Number of Visits  10    PT Start Time  0920    PT Stop Time  1005    PT Time Calculation (min)  45 min    Equipment Utilized During Treatment  Left knee immobilizer    Activity Tolerance  Patient limited by pain    Behavior During Therapy  Anxious       Past Medical History:  Diagnosis Date  . Allergy    Chronic  . Angio-edema   . Anxiety   . Asthma   . Beta thalassemia (HCC)   . Eczema   . IBS (irritable bowel syndrome)   . Reflux   . Urticaria     Past Surgical History:  Procedure Laterality Date  . CYST REMOVAL HAND    . WISDOM TOOTH EXTRACTION  2016    There were no vitals filed for this visit.  Subjective Assessment - 03/28/19 1008    Subjective  pt states she retuns to MD today.  States she has slight pain of 3/10 in her Lt knee and has been doing her ankle pumps.    Currently in Pain?  Yes    Pain Score  3     Pain Location  Knee    Pain Orientation  Left    Pain Descriptors / Indicators  Aching         [facility] PT Assessment - 03/28/19 0001      Observation/Other Assessments   Focus on Therapeutic Outcomes (FOTO)   38                   [facility] Adult PT Treatment/Exercise - 03/28/19 0001      Knee/Hip Exercises: Standing   Gait Training  using axillary crutches, getting crutch height correct/UE  adjustments      Knee/Hip Exercises: Supine   Quad Sets  Left;10 reps    Straight Leg Raises  Left;5 reps;AAROM    Other Supine Knee/Hip Exercises  hamstring sets 10X5"      Knee/Hip Exercises: Sidelying   Hip ADduction  Left;10 reps      Manual Therapy   Manual Therapy  Joint mobilization;Edema management    Manual therapy comments  completed seperately from all other skillled intervention    Edema Management  retro massage    Joint Mobilization  inferior/superior patellar mobs (no lateral Mobs)             PT Education - 03/28/19 1301    Education Details  goal and HEP, POC moving foward.  Informed of receival of protocol. Explained how we adjust crutches and with noted improvement following.    Person(s) Educated  Patient    Methods  Explanation;Demonstration    Comprehension  Verbalized understanding       PT  Short Term Goals - 03/24/19 1557      PT SHORT TERM GOAL #1   Title  Patient will report understanding and regular compliance with HEP to improve ROM, strength and overall mobility.    Time  3    Period  Weeks    Status  New    Target Date  04/14/19      PT SHORT TERM GOAL #2   Title  Within protocol, patient will demonstrate left knee extension/flexion active range of motion of at least 0-90 degrees to assist with more normalized gait pattern and stair ambulation.    Time  3    Period  Weeks    Status  New    Target Date  04/14/19      PT SHORT TERM GOAL #3   Title  --    Time  --    Period  --    Status  --    Target Date  --        PT Long Term Goals - 03/24/19 1554      PT LONG TERM GOAL #1   Title  Patient will demonstrate ability to ambulate at a gait velocity of at least 1.0 m/s with LRAD in order to improve safety with community ambulation.    Time  6    Period  Weeks    Status  New    Target Date  05/05/19      PT LONG TERM GOAL #2   Title  Within protocol, patient will demonstrate left knee extension/flexion active range of motion  of at least 0-125 degrees to assist with more normalized gait pattern and stair ambulation.    Time  6    Period  Weeks    Status  New    Target Date  05/05/19      PT LONG TERM GOAL #3   Title  Patient will demonstrate left lower extremity strength within functional limits in order to return to daily activities safely.    Time  6    Period  Weeks    Status  New    Target Date  05/05/19      PT LONG TERM GOAL #4   Title  Patient will have circumferential measurement of left knee within 0.5-1 cm difference between the right knee indicating decreased edema for improved ease of mobility.    Time  6    Period  Weeks    Status  New    Target Date  05/05/19      PT LONG TERM GOAL #5   Title  Patient will report ability to ambulate for at least 20 minutes without pain greater than 3 out of 10 in order to perform community activities such as grocery shopping with improved ease.    Time  6    Period  Weeks    Status  New    Target Date  05/05/19            Plan - 03/28/19 0950    Clinical Impression Statement  Noted severly fwd flexion with ambulation.  Crutches were too short and UE's too extended.  Able to ajust crutches correct height and move UE supports up for much better fit and erect posturing.  Introduced therex allowed per protocol with pt demonstrating notable weakness with all exercises.  AAROM required to complete SLR and weak contractions palpated with quad and ham sets.  Pt given additional exercises to add to HEP.   FOTO  completed with resultant score of 38%.    Personal Factors and Comorbidities  Other   GAD   Examination-Activity Limitations  Bathing;Stand;Bed Mobility;Locomotion Level;Toileting;Bend;Transfers;Sit;Squat;Dressing;Hygiene/Grooming;Stairs    Examination-Participation Restrictions  Cleaning;Meal Prep;Community Activity;Driving;Shop;Laundry    Stability/Clinical Decision Making  Stable/Uncomplicated    Rehab Potential  Fair    PT Frequency  2x / week     PT Duration  6 weeks    PT Treatment/Interventions  ADLs/Self Care Home Management;Cryotherapy;Electrical Stimulation;Moist Heat;DME Instruction;Gait training;Stair training;Functional mobility training;Therapeutic activities;Therapeutic exercise;Balance training;Neuromuscular re-education;Patient/family education;Manual techniques;Passive range of motion;Dry needling;Energy conservation;Taping;Orthotic Fit/Training    PT Next Visit Plan  Follow-up regarding MD appointment with hopes of increasing brace ROM.  Progress per protocol (see below under recommended other services)    PT Home Exercise Plan  Continue exercises provided at hospital, ICE/elevate and ankle pumps  12/21:  QS, ham set, SL hip Add    Recommended Other Services  Protocol:  phase 1 (to 2 weeks) TDWB brace and ROM 0-45, sup/inf patellar mobs (no laterals), QS/HS/SLR/ sidelying Add    Phase 2: 3-5 weeks, PWB, brace and ROM O-90, SL balance with brace, heelraises, core strengthening   Phase 3: 6 weeks FWB, Full ROM with brace open, patellar mobs, bike when ROM >115, minisquats, progress core strengthening, sport cord strengthening when full ROM    Consulted and Agree with Plan of Care  Patient       Patient will benefit from skilled therapeutic intervention in order to improve the following deficits and impairments:  Abnormal gait, Impaired sensation, Improper body mechanics, Pain, Decreased mobility, Decreased activity tolerance, Decreased endurance, Decreased range of motion, Decreased strength, Hypomobility, Decreased balance, Difficulty walking, Increased edema  Visit Diagnosis: Acute pain of left knee  Stiffness of left knee, not elsewhere classified  Localized edema  Muscle weakness (generalized)  Other abnormalities of gait and mobility     Problem List Patient Active Problem List   Diagnosis Date Noted  . Generalized anxiety disorder 11/09/2017  . Chronic abdominal pain 10/16/2017   Teena Irani,  PTA/CLT [phone]  Teena Irani 03/28/2019, 1:30 PM  [facility] [address] [place], [place], [address] Phone: [phone]   Fax:  [phone]  Name: SYNIYAH BOURNE MRN: [id number] Date of Birth: 2000-07-10

## 2019-03-28 NOTE — Patient Instructions (Signed)
ADDUCTION: Side-Lying (Active)    Lie on right side, with top leg bent and in front of other leg. Lift straight leg up as high as possible. Complete _10__ repetitions. Perform _2__ sessions per day.    Straight Leg Raise    Tighten stomach and slowly raise locked right leg _15___ inches from floor. Repeat __10__ times per set. Do __2__ sessions per day.    Hamstring Set    With one leg bent slightly, push heel into bed without bending knee further. Hold _5__ seconds.  Repeat _10___ times. Do _2___ sessions per day.    Quad Sets    Squeeze pelvic floor and hold. Tighten top of left thigh. Hold for _5__ seconds. Repeat _10__ times. Do _2__ times a day.

## 2019-03-30 ENCOUNTER — Ambulatory Visit (HOSPITAL_COMMUNITY): Payer: No Typology Code available for payment source | Admitting: Physical Therapy

## 2019-03-30 ENCOUNTER — Ambulatory Visit (INDEPENDENT_AMBULATORY_CARE_PROVIDER_SITE_OTHER): Payer: Managed Care, Other (non HMO) | Admitting: Nurse Practitioner

## 2019-03-30 ENCOUNTER — Other Ambulatory Visit: Payer: Self-pay

## 2019-03-30 ENCOUNTER — Encounter: Payer: Self-pay | Admitting: Nurse Practitioner

## 2019-03-30 ENCOUNTER — Telehealth (HOSPITAL_COMMUNITY): Payer: Self-pay

## 2019-03-30 DIAGNOSIS — J069 Acute upper respiratory infection, unspecified: Secondary | ICD-10-CM | POA: Diagnosis not present

## 2019-03-30 MED ORDER — AMOXICILLIN-POT CLAVULANATE 875-125 MG PO TABS: 1.0000 | ORAL_TABLET | Freq: Two times a day (BID) | ORAL | 0 refills | Status: DC

## 2019-03-30 NOTE — Progress Notes (Signed)
   Subjective:    Patient ID: Renee Savage, female    DOB: [birth year], 18 y.o.   MRN: [id number]  Cough This is a new problem. Associated symptoms include headaches and nasal congestion. Associated symptoms comments: Sinus pressure.    Covid- Rapid Test negative today  Review of Systems  Respiratory: Positive for cough.   Neurological: Positive for headaches.   Virtual Visit via Video Note  I connected with Renee Savage on 03/30/19 at  1:40 PM EST by a video enabled telemedicine application and verified that I am speaking with the correct person using two identifiers.  Location: Patient: home Provider: office   I discussed the limitations of evaluation and management by telemedicine and the availability of in person appointments. The patient expressed understanding and agreed to proceed.  History of Present Illness:    Observations/Objective:   Assessment and Plan:   Follow Up Instructions:    I discussed the assessment and treatment plan with the patient. The patient was provided an opportunity to ask questions and all were answered. The patient agreed with the plan and demonstrated an understanding of the instructions.   The patient was advised to call back or seek an in-person evaluation if the symptoms worsen or if the condition fails to improve as anticipated.  I provided 15 minutes of non-face-to-face time during this encounter.        Objective:   Physical Exam        Assessment & Plan:

## 2019-03-30 NOTE — Telephone Encounter (Signed)
She is sick today °

## 2019-03-30 NOTE — Progress Notes (Signed)
  VIRTUAL VISIT Subjective:    Patient ID: Renee Savage, female    DOB: November 04, 2000, 18 y.o.   MRN: [id number]  HPI patient presents today for a virtual visit with complaints of cough, headache, nasal congestion, and sinus pressure. Congestion and fatigue began on Monday that has progressed to cough and sinus pressure today. Rapid COVID test negative today. Patient denies fever, loss of taste or smell, difficulty breathing or sputum production. Patient denies sore throat or ear ache. Additionally, patient has had recent knee surgery with limited mobility and attending physical therapy regularly.   Allergy to amoxicillin listed in chart. However, patient reports receiving amoxicillin as an adult and tolerated well.   Review of Systems See HPI     Objective:   Physical Exam  Patient at home and ill appearing.  Patient alert and oriented. Maintained appropriate eye contact. Thoughts logical and coherent.        Assessment & Plan:   Acute Upper Respiratory Infection -Medication: Begin taking augmentin. Take with food to avoid GI upset. OTC recommendations include Delsym cough syrup, guaifenesin, saline nasal spray, and advil cold and sinus. If taking advil cold and sinus, do not take additional advil.  -education: increase fluids and warm compresses to sinuses.  -follow up: Notify us if you do not improve or symptoms worsen. Warning signs and symptoms reviewed.

## 2019-04-04 ENCOUNTER — Other Ambulatory Visit: Payer: Self-pay | Admitting: *Deleted

## 2019-04-04 ENCOUNTER — Telehealth: Payer: Self-pay | Admitting: Family Medicine

## 2019-04-04 ENCOUNTER — Telehealth (HOSPITAL_COMMUNITY): Payer: Self-pay

## 2019-04-04 MED ORDER — BENZONATATE 100 MG PO CAPS: ORAL_CAPSULE | ORAL | 0 refills | Status: DC

## 2019-04-04 NOTE — Telephone Encounter (Signed)
Aug is broad spectrum and covers all the usual bact components, stay on it   Add tessalon perles 100 mg numb 30 one q six hrs prn cough

## 2019-04-04 NOTE — Telephone Encounter (Signed)
PT CALLED TO CANCEL THE APPT FOR TOMORROW DUE TO SHE IS UNDER THE WEATHER

## 2019-04-04 NOTE — Telephone Encounter (Signed)
Pt had virtual visit 03/30/2019 with Hoyle Sauer, sinus infxn & cough, Covid test negative   Pt states cough is worse & Hoyle Sauer told pt if it got worse to call us today  Please advise & call pt     [facility]

## 2019-04-04 NOTE — Telephone Encounter (Signed)
Cough is worse and nose is stuffy and drainage in throat. No fever, low grade one time when it first started. Still on antibiotic and mucinex. No sob.

## 2019-04-04 NOTE — Telephone Encounter (Signed)
Discussed with pt. Pt verbalized understanding. Med sent to pharm.  

## 2019-04-05 ENCOUNTER — Ambulatory Visit (HOSPITAL_COMMUNITY): Payer: No Typology Code available for payment source

## 2019-04-06 ENCOUNTER — Telehealth (HOSPITAL_COMMUNITY): Payer: Self-pay

## 2019-04-06 NOTE — Telephone Encounter (Signed)
pt called to cancel her appt for tomorrow due a sinus infection

## 2019-04-07 ENCOUNTER — Telehealth (HOSPITAL_COMMUNITY): Payer: Self-pay

## 2019-04-07 ENCOUNTER — Ambulatory Visit (HOSPITAL_COMMUNITY): Payer: No Typology Code available for payment source

## 2019-04-07 NOTE — Telephone Encounter (Signed)
Tulip's father called concerned with Renee Savage's current status with rehab.  She had a respiratory infection that has been treated with medication though continues to have some cough.  (Hx of asthma).  Has had a COVID test that was negative.  Due to coughing pt has been unable to attend PT session.  Pt concerned with current status with PT, reviewed HEP and encouraged her to continues with quad/ham sets and SLR.  Pt also concerned that her brace is sliding down now that the swelling has reduced.  Pt educated on adjust brace as best she could with the velcro or to call MD about possibly changing brace.  [address], [place]; [facility] [phone]

## 2019-04-11 ENCOUNTER — Ambulatory Visit (HOSPITAL_COMMUNITY): Payer: No Typology Code available for payment source | Attending: Orthopedic Surgery | Admitting: Physical Therapy

## 2019-04-11 ENCOUNTER — Other Ambulatory Visit: Payer: Self-pay

## 2019-04-11 DIAGNOSIS — M25662 Stiffness of left knee, not elsewhere classified: Secondary | ICD-10-CM | POA: Insufficient documentation

## 2019-04-11 DIAGNOSIS — R2689 Other abnormalities of gait and mobility: Secondary | ICD-10-CM | POA: Insufficient documentation

## 2019-04-11 DIAGNOSIS — M25562 Pain in left knee: Secondary | ICD-10-CM | POA: Diagnosis not present

## 2019-04-11 DIAGNOSIS — R600 Localized edema: Secondary | ICD-10-CM | POA: Insufficient documentation

## 2019-04-11 DIAGNOSIS — M6281 Muscle weakness (generalized): Secondary | ICD-10-CM | POA: Insufficient documentation

## 2019-04-11 NOTE — Therapy (Signed)
Renee Savage, [place], [address] Phone: [phone]   Fax:  [phone]  Physical Therapy Treatment  Patient Details  Name: Renee Savage MRN: [id number] Date of Birth: Jun 07, 2000 Referring Provider (PT): Victorino December, MD   Encounter Date: 04/11/2019  PT End of Session - 04/11/19 1155    Visit Number  3    Number of Visits  12    Date for PT Re-Evaluation  05/05/19    Authorization Type  Generic commercial (90 visit limit combined PT/OT/ST)    Authorization Time Period  03/24/19 - 05/05/19. Progress note on visit 10.    Authorization - Visit Number  3    Authorization - Number of Visits  10    PT Start Time  [phone]   pt was late for appointment   PT Stop Time  1015    PT Time Calculation (min)  50 min    Equipment Utilized During Treatment  Left knee immobilizer    Activity Tolerance  Patient limited by pain    Behavior During Therapy  Anxious       Past Medical History:  Diagnosis Date  . Allergy    Chronic  . Angio-edema   . Anxiety   . Asthma   . Beta thalassemia ([place])   . Eczema   . IBS (irritable bowel syndrome)   . Reflux   . Urticaria     Past Surgical History:  Procedure Laterality Date  . CYST REMOVAL HAND    . WISDOM TOOTH EXTRACTION  2016    There were no vitals filed for this visit.  Subjective Assessment - 04/11/19 1112    Subjective  pt returns today following 2 weeks missed of therapy due to a sinus infection.  Returns today with brace still locked into extension and using bilateral axillary crutches for ambulation.                       [place] Adult PT Treatment/Exercise - 04/11/19 0001      Ambulation/Gait   Ambulation/Gait  Yes    Ambulation/Gait Assistance  5: Supervision    Ambulation Distance (Feet)  226 Feet    Assistive device  Crutches    Gait Pattern  Step-through pattern    Ambulation Surface  Level;Indoor      Knee/Hip Exercises: Standing   Heel  Raises  10 reps;2 sets    Heel Raises Limitations  with brace on    Gait Training  1 crutch gait with brace unlocked at 40, heel to toe gait 226      Knee/Hip Exercises: Supine   Quad Sets  Left;10 reps    Heel Slides  Left;10 reps    Straight Leg Raises  Left;10 reps    Straight Leg Raises Limitations  in extension with only slight lag    Knee Flexion  AROM    Knee Flexion Limitations  70    Other Supine Knee/Hip Exercises  hamstring sets 10X5"      Knee/Hip Exercises: Sidelying   Hip ADduction  Left;10 reps      Manual Therapy   Manual Therapy  Joint mobilization    Manual therapy comments  completed seperately from all other skillled intervention    Joint Mobilization  inferior/superior patellar mobs (no lateral Mobs)             PT Education - 04/11/19 1113    Education Details  reveiwed protocol and  expections of week 4.  Encouraged to moisturize her LE, heel to toe gait with brace unlocked to 40 degrees    Person(s) Educated  Patient    Methods  Explanation;Demonstration;Tactile cues;Verbal cues    Comprehension  Verbalized understanding;Returned demonstration;Verbal cues required;Tactile cues required       PT Short Term Goals - 03/24/19 1557      PT SHORT TERM GOAL #1   Title  Patient will report understanding and regular compliance with HEP to improve ROM, strength and overall mobility.    Time  3    Period  Weeks    Status  New    Target Date  04/14/19      PT SHORT TERM GOAL #2   Title  Within protocol, patient will demonstrate left knee extension/flexion active range of motion of at least 0-90 degrees to assist with more normalized gait pattern and stair ambulation.    Time  3    Period  Weeks    Status  New    Target Date  04/14/19      PT SHORT TERM GOAL #3   Title  --    Time  --    Period  --    Status  --    Target Date  --        PT Long Term Goals - 03/24/19 1554      PT LONG TERM GOAL #1   Title  Patient will demonstrate ability to  ambulate at a gait velocity of at least 1.0 m/s with LRAD in order to improve safety with community ambulation.    Time  6    Period  Weeks    Status  New    Target Date  05/05/19      PT LONG TERM GOAL #2   Title  Within protocol, patient will demonstrate left knee extension/flexion active range of motion of at least 0-125 degrees to assist with more normalized gait pattern and stair ambulation.    Time  6    Period  Weeks    Status  New    Target Date  05/05/19      PT LONG TERM GOAL #3   Title  Patient will demonstrate left lower extremity strength within functional limits in order to return to daily activities safely.    Time  6    Period  Weeks    Status  New    Target Date  05/05/19      PT LONG TERM GOAL #4   Title  Patient will have circumferential measurement of left knee within 0.5-1 cm difference between the right knee indicating decreased edema for improved ease of mobility.    Time  6    Period  Weeks    Status  New    Target Date  05/05/19      PT LONG TERM GOAL #5   Title  Patient will report ability to ambulate for at least 20 minutes without pain greater than 3 out of 10 in order to perform community activities such as grocery shopping with improved ease.    Time  6    Period  Weeks    Status  New    Target Date  05/05/19            Plan - 04/11/19 1155    Clinical Impression Statement  Pt returns today with continued use of bilateral axilary crutches and brace locked into extension. Pt no longer with edema, therefore  brace fitting improperly and having diffuculty keeping in place.  Therapist removed length of straps to improve fit and adjuested to 40 degrees today (per protocol should be at 90 this week) and AROM today at 70 degrees.  Discussed prototocol with pateint and expectations of week 4. Encourgaged heel toe gait using 1 crutch only and application of lotion as skin extremely dry on Lt LE.  Pt verbalized understanding of both.    Personal Factors  and Comorbidities  Other   GAD   Examination-Activity Limitations  Bathing;Stand;Bed Mobility;Locomotion Level;Toileting;Bend;Transfers;Sit;Squat;Dressing;Hygiene/Grooming;Stairs    Examination-Participation Restrictions  Cleaning;Meal Prep;Community Activity;Driving;Shop;Laundry    Stability/Clinical Decision Making  Stable/Uncomplicated    Rehab Potential  Fair    PT Frequency  2x / week    PT Duration  6 weeks    PT Treatment/Interventions  ADLs/Self Care Home Management;Cryotherapy;Electrical Stimulation;Moist Heat;DME Instruction;Gait training;Stair training;Functional mobility training;Therapeutic activities;Therapeutic exercise;Balance training;Neuromuscular re-education;Patient/family education;Manual techniques;Passive range of motion;Dry needling;Energy conservation;Taping;Orthotic Fit/Training    PT Next Visit Plan  Progress per protocol (see below under recommended other services).  Increase brace flexion to 90 degrees next session and continue with established exercises.    PT Home Exercise Plan  Continue exercises provided at hospital, ICE/elevate and ankle pumps  12/21:  QS, ham set, SL hip Add    Consulted and Agree with Plan of Care  Patient       Patient will benefit from skilled therapeutic intervention in order to improve the following deficits and impairments:  Abnormal gait, Impaired sensation, Improper body mechanics, Pain, Decreased mobility, Decreased activity tolerance, Decreased endurance, Decreased range of motion, Decreased strength, Hypomobility, Decreased balance, Difficulty walking, Increased edema  Visit Diagnosis: Acute pain of left knee  Stiffness of left knee, not elsewhere classified     Problem List Patient Active Problem List   Diagnosis Date Noted  . Generalized anxiety disorder 11/09/2017  . Chronic abdominal pain 10/16/2017   Lurena Nida, PTA/CLT [phone]  Lurena Nida 04/11/2019, 12:00 PM  [facility] [address] [place], [place], [address] Phone: [phone]   Fax:  [phone]  Name: Renee Savage MRN: [id number] Date of Birth: [birth year]

## 2019-04-13 ENCOUNTER — Ambulatory Visit (HOSPITAL_COMMUNITY): Payer: No Typology Code available for payment source | Admitting: Physical Therapy

## 2019-04-13 ENCOUNTER — Encounter (HOSPITAL_COMMUNITY): Payer: Self-pay | Admitting: Physical Therapy

## 2019-04-13 ENCOUNTER — Other Ambulatory Visit: Payer: Self-pay

## 2019-04-13 DIAGNOSIS — M25562 Pain in left knee: Secondary | ICD-10-CM | POA: Diagnosis not present

## 2019-04-13 DIAGNOSIS — M6281 Muscle weakness (generalized): Secondary | ICD-10-CM

## 2019-04-13 DIAGNOSIS — R2689 Other abnormalities of gait and mobility: Secondary | ICD-10-CM

## 2019-04-13 DIAGNOSIS — R600 Localized edema: Secondary | ICD-10-CM

## 2019-04-13 DIAGNOSIS — M25662 Stiffness of left knee, not elsewhere classified: Secondary | ICD-10-CM

## 2019-04-13 NOTE — Therapy (Signed)
[place], [place], [address] Phone: [phone]   Fax:  [phone]  Physical Therapy Treatment  Patient Details  Name: Renee Savage MRN: [id number] Date of Birth: [birth year] Referring Provider (PT): Victorino December, MD   Encounter Date: 04/13/2019  PT End of Session - 04/13/19 0933    Visit Number  4    Number of Visits  12    Date for PT Re-Evaluation  05/05/19    Authorization Type  Generic commercial (90 visit limit combined PT/OT/ST)    Authorization Time Period  03/24/19 - 05/05/19. Progress note on visit 10.    Authorization - Visit Number  4    Authorization - Number of Visits  10    PT Start Time  [phone]   patient arrives late   PT Stop Time  0946    PT Time Calculation (min)  41 min    Equipment Utilized During Treatment  Left knee immobilizer    Activity Tolerance  Patient tolerated treatment well    Behavior During Therapy  Anxious       Past Medical History:  Diagnosis Date  . Allergy    Chronic  . Angio-edema   . Anxiety   . Asthma   . Beta thalassemia ([place])   . Eczema   . IBS (irritable bowel syndrome)   . Reflux   . Urticaria     Past Surgical History:  Procedure Laterality Date  . CYST REMOVAL HAND    . WISDOM TOOTH EXTRACTION  2016    There were no vitals filed for this visit.  Subjective Assessment - 04/13/19 0911    Subjective  Patient reports that her brace has been sliding a lot and that hinders her walking. Her swelling has been going down. Her exercises have been going well and she has been able to complete all of them at home.    Currently in Pain?  Yes    Pain Score  1     Pain Location  Knee                       [facility] Adult PT Treatment/Exercise - 04/13/19 0001      Ambulation/Gait   Ambulation/Gait  Yes    Ambulation/Gait Assistance  5: Supervision    Ambulation Distance (Feet)  300 Feet    Assistive device  R Axillary Crutch    Gait Pattern   Step-through pattern    Ambulation Surface  Level;Indoor    Gait Comments  cueing for heel toe gait and improving knee flexion ROM during swing phase with brace unlocked to 90      Knee/Hip Exercises: Standing   Heel Raises  10 reps;3 sets    Heel Raises Limitations  with brace on      Knee/Hip Exercises: Supine   Quad Sets  Left;10 reps    Heel Slides  Left;10 reps    Straight Leg Raises  Left;10 reps    Straight Leg Raises Limitations  in extension with only slight lag    Knee Flexion  AROM    Knee Flexion Limitations  82    Other Supine Knee/Hip Exercises  hamstring sets 10X5"      Knee/Hip Exercises: Sidelying   Hip ADduction  Left;10 reps             PT Education - 04/13/19 0931    Education Details  Patient educated on protocol, review of  HEP, educated on gait mechanics, educated on proper mechanics with exercise and decreasing quad lag, educated on how to don/doffing brace as well as adjuct to prevent sliding down leg    Person(s) Educated  Patient    Methods  Explanation    Comprehension  Verbalized understanding       PT Short Term Goals - 04/13/19 1006      PT SHORT TERM GOAL #1   Title  Patient will report understanding and regular compliance with HEP to improve ROM, strength and overall mobility.    Time  3    Period  Weeks    Status  On-going    Target Date  04/14/19      PT SHORT TERM GOAL #2   Title  Within protocol, patient will demonstrate left knee extension/flexion active range of motion of at least 0-90 degrees to assist with more normalized gait pattern and stair ambulation.    Time  3    Period  Weeks    Status  On-going    Target Date  04/14/19        PT Long Term Goals - 04/13/19 1007      PT LONG TERM GOAL #1   Title  Patient will demonstrate ability to ambulate at a gait velocity of at least 1.0 m/s with LRAD in order to improve safety with community ambulation.    Time  6    Period  Weeks    Status  On-going      PT LONG TERM  GOAL #2   Title  Within protocol, patient will demonstrate left knee extension/flexion active range of motion of at least 0-125 degrees to assist with more normalized gait pattern and stair ambulation.    Time  6    Period  Weeks    Status  On-going      PT LONG TERM GOAL #3   Title  Patient will demonstrate left lower extremity strength within functional limits in order to return to daily activities safely.    Time  6    Period  Weeks    Status  On-going      PT LONG TERM GOAL #4   Title  Patient will have circumferential measurement of left knee within 0.5-1 cm difference between the right knee indicating decreased edema for improved ease of mobility.    Time  6    Period  Weeks    Status  On-going      PT LONG TERM GOAL #5   Title  Patient will report ability to ambulate for at least 20 minutes without pain greater than 3 out of 10 in order to perform community activities such as grocery shopping with improved ease.    Time  6    Period  Weeks    Status  On-going            Plan - 04/13/19 0935    Clinical Impression Statement  Patient ambulates into clinic with brace slid down her leg with hinge below knee limiting knee ROM. Brace is adjusted and patient is educated on proper way to don/doff/adjust brace to limit sliding. Patient demonstrating improving gait patter with unilateral axillary crutch but continues to ambulate with limited knee flexion during swing phase. Brace is adjusted to allow for 90 degrees flexion per protocol but patient continues to ambulate with limited knee flexion despite cueing for increased knee flexion. Her ROM is improving since last session. Patient continues to demonstrate quad lag with  SLR due to impaired quad strength. Patient will continue to benefit from skilled physical therapy in order to improve function and reduce impairment.    Personal Factors and Comorbidities  Other   GAD   Examination-Activity Limitations  Bathing;Stand;Bed  Mobility;Locomotion Level;Toileting;Bend;Transfers;Sit;Squat;Dressing;Hygiene/Grooming;Stairs    Examination-Participation Restrictions  Cleaning;Meal Prep;Community Activity;Driving;Shop;Laundry    Stability/Clinical Decision Making  Stable/Uncomplicated    Rehab Potential  Fair    PT Frequency  2x / week    PT Duration  6 weeks    PT Treatment/Interventions  ADLs/Self Care Home Management;Cryotherapy;Electrical Stimulation;Moist Heat;DME Instruction;Gait training;Stair training;Functional mobility training;Therapeutic activities;Therapeutic exercise;Balance training;Neuromuscular re-education;Patient/family education;Manual techniques;Passive range of motion;Dry needling;Energy conservation;Taping;Orthotic Fit/Training    PT Next Visit Plan  Progress per protocol (see below under recommended other services).    PT Home Exercise Plan  Continue exercises provided at hospital, ICE/elevate and ankle pumps  12/21:  QS, ham set, SL hip Add 04/13/19 Heel raises 3x10    Consulted and Agree with Plan of Care  Patient       Patient will benefit from skilled therapeutic intervention in order to improve the following deficits and impairments:  Abnormal gait, Impaired sensation, Improper body mechanics, Pain, Decreased mobility, Decreased activity tolerance, Decreased endurance, Decreased range of motion, Decreased strength, Hypomobility, Decreased balance, Difficulty walking, Increased edema  Visit Diagnosis: Acute pain of left knee  Stiffness of left knee, not elsewhere classified  Localized edema  Muscle weakness (generalized)  Other abnormalities of gait and mobility     Problem List Patient Active Problem List   Diagnosis Date Noted  . Generalized anxiety disorder 11/09/2017  . Chronic abdominal pain 10/16/2017    10:08 AM, 04/13/19 Wyman Songster PT, DPT Physical Therapist at [facility]    [facility] [address] [place], [place], [address] Phone: [phone]   Fax:  [phone]  Name: ZOANNE NEWILL MRN: [id number] Date of Birth: October 27, 2000

## 2019-04-13 NOTE — Patient Instructions (Signed)
Access Code: [email or web address]  URL: [email or web address]  Date: 04/13/2019  Prepared by: Myelle Poteat   Exercises Heel rises with counter support - 10 reps - 3 sets - 1x daily - 7x weekly

## 2019-04-15 ENCOUNTER — Ambulatory Visit (HOSPITAL_COMMUNITY): Payer: No Typology Code available for payment source | Admitting: Physical Therapy

## 2019-04-15 ENCOUNTER — Encounter (HOSPITAL_COMMUNITY): Payer: Self-pay | Admitting: Physical Therapy

## 2019-04-15 ENCOUNTER — Other Ambulatory Visit: Payer: Self-pay

## 2019-04-15 DIAGNOSIS — R2689 Other abnormalities of gait and mobility: Secondary | ICD-10-CM

## 2019-04-15 DIAGNOSIS — M25562 Pain in left knee: Secondary | ICD-10-CM | POA: Diagnosis not present

## 2019-04-15 DIAGNOSIS — M25662 Stiffness of left knee, not elsewhere classified: Secondary | ICD-10-CM

## 2019-04-15 DIAGNOSIS — M6281 Muscle weakness (generalized): Secondary | ICD-10-CM

## 2019-04-15 DIAGNOSIS — R600 Localized edema: Secondary | ICD-10-CM

## 2019-04-15 NOTE — Therapy (Signed)
[place], [place], [address] Phone: [phone]   Fax:  [phone]  Physical Therapy Treatment  Patient Details  Name: Renee Savage MRN: [id number] Date of Birth: 08-26-2000 Referring Provider (PT): Victorino December, MD   Encounter Date: 04/15/2019  PT End of Session - 04/15/19 0909    Visit Number  5    Number of Visits  12    Date for PT Re-Evaluation  05/05/19    Authorization Type  Generic commercial (90 visit limit combined PT/OT/ST)    Authorization Time Period  03/24/19 - 05/05/19. Progress note on visit 10.    Authorization - Visit Number  5    Authorization - Number of Visits  10    PT Start Time  0900    PT Stop Time  0940    PT Time Calculation (min)  40 min    Equipment Utilized During Treatment  Left knee immobilizer    Activity Tolerance  Patient tolerated treatment well    Behavior During Therapy  Anxious       Past Medical History:  Diagnosis Date  . Allergy    Chronic  . Angio-edema   . Anxiety   . Asthma   . Beta thalassemia ([place])   . Eczema   . IBS (irritable bowel syndrome)   . Reflux   . Urticaria     Past Surgical History:  Procedure Laterality Date  . CYST REMOVAL HAND    . WISDOM TOOTH EXTRACTION  2016    There were no vitals filed for this visit.  Subjective Assessment - 04/15/19 0903    Subjective  Patient reported that her brace is unlocked to 90 degrees.    Currently in Pain?  Yes    Pain Score  1     Pain Location  Knee    Pain Orientation  Left    Pain Descriptors / Indicators  Aching    Pain Type  Surgical pain                       [facility] Adult PT Treatment/Exercise - 04/15/19 0001      Ambulation/Gait   Ambulation/Gait  Yes    Ambulation/Gait Assistance  5: Supervision    Ambulation Distance (Feet)  226 Feet    Assistive device  R Axillary Crutch    Gait Pattern  Step-through pattern    Ambulation Surface  Level;Indoor    Gait  Comments  Cueing for increased knee flexion      Knee/Hip Exercises: Standing   Heel Raises  10 reps;3 sets    Heel Raises Limitations  with brace on      Knee/Hip Exercises: Supine   Quad Sets  Left;10 reps    Heel Slides  Left;10 reps;2 sets    Straight Leg Raises  Left;10 reps    Knee Flexion  AROM    Knee Flexion Limitations  88    Other Supine Knee/Hip Exercises  Crunches x20 with cues for form      Knee/Hip Exercises: Sidelying   Hip ADduction  Left;10 reps               PT Short Term Goals - 04/13/19 1006      PT SHORT TERM GOAL #1   Title  Patient will report understanding and regular compliance with HEP to improve ROM, strength and overall mobility.    Time  3    Period  Weeks    Status  On-going    Target Date  04/14/19      PT SHORT TERM GOAL #2   Title  Within protocol, patient will demonstrate left knee extension/flexion active range of motion of at least 0-90 degrees to assist with more normalized gait pattern and stair ambulation.    Time  3    Period  Weeks    Status  On-going    Target Date  04/14/19        PT Long Term Goals - 04/13/19 1007      PT LONG TERM GOAL #1   Title  Patient will demonstrate ability to ambulate at a gait velocity of at least 1.0 m/s with LRAD in order to improve safety with community ambulation.    Time  6    Period  Weeks    Status  On-going      PT LONG TERM GOAL #2   Title  Within protocol, patient will demonstrate left knee extension/flexion active range of motion of at least 0-125 degrees to assist with more normalized gait pattern and stair ambulation.    Time  6    Period  Weeks    Status  On-going      PT LONG TERM GOAL #3   Title  Patient will demonstrate left lower extremity strength within functional limits in order to return to daily activities safely.    Time  6    Period  Weeks    Status  On-going      PT LONG TERM GOAL #4   Title  Patient will have circumferential measurement of left knee  within 0.5-1 cm difference between the right knee indicating decreased edema for improved ease of mobility.    Time  6    Period  Weeks    Status  On-going      PT LONG TERM GOAL #5   Title  Patient will report ability to ambulate for at least 20 minutes without pain greater than 3 out of 10 in order to perform community activities such as grocery shopping with improved ease.    Time  6    Period  Weeks    Status  On-going            Plan - 04/15/19 1035    Clinical Impression Statement  Continued with established POC this session. Patient's AROM increased to 88 degrees this session. This session added crunches to improve core strengthening which patient required verbal cueing to perform with proper form. Patient did present with the knee brace sliding down from knee, but patient recognized this and was able to correct it independently. Patient would benefit from continued skilled physical therapy in order to continue progressing towards functional goals.    Personal Factors and Comorbidities  Other   GAD   Examination-Activity Limitations  Bathing;Stand;Bed Mobility;Locomotion Level;Toileting;Bend;Transfers;Sit;Squat;Dressing;Hygiene/Grooming;Stairs    Examination-Participation Restrictions  Cleaning;Meal Prep;Community Activity;Driving;Shop;Laundry    Stability/Clinical Decision Making  Stable/Uncomplicated    Rehab Potential  Fair    PT Frequency  2x / week    PT Duration  6 weeks    PT Treatment/Interventions  ADLs/Self Care Home Management;Cryotherapy;Electrical Stimulation;Moist Heat;DME Instruction;Gait training;Stair training;Functional mobility training;Therapeutic activities;Therapeutic exercise;Balance training;Neuromuscular re-education;Patient/family education;Manual techniques;Passive range of motion;Dry needling;Energy conservation;Taping;Orthotic Fit/Training    PT Next Visit Plan  Progress per protocol (see below under recommended other services).    PT Home Exercise  Plan  Continue exercises provided at hospital, ICE/elevate and ankle pumps  12/21:  QS, ham set, SL hip Add 04/13/19 Heel raises 3x10    Consulted and Agree with Plan of Care  Patient       Patient will benefit from skilled therapeutic intervention in order to improve the following deficits and impairments:  Abnormal gait, Impaired sensation, Improper body mechanics, Pain, Decreased mobility, Decreased activity tolerance, Decreased endurance, Decreased range of motion, Decreased strength, Hypomobility, Decreased balance, Difficulty walking, Increased edema  Visit Diagnosis: Acute pain of left knee  Stiffness of left knee, not elsewhere classified  Localized edema  Muscle weakness (generalized)  Other abnormalities of gait and mobility     Problem List Patient Active Problem List   Diagnosis Date Noted  . Generalized anxiety disorder 11/09/2017  . Chronic abdominal pain 10/16/2017   Verne Carrow PT, DPT 10:37 AM, 04/15/19 [phone]  [facility] [address] [place], [place], [address] Phone: [phone]   Fax:  [phone]  Name: Renee Savage MRN: [id number] Date of Birth: Sep 09, 2000

## 2019-04-18 ENCOUNTER — Encounter (HOSPITAL_COMMUNITY): Payer: Self-pay | Admitting: Physical Therapy

## 2019-04-18 ENCOUNTER — Ambulatory Visit (HOSPITAL_COMMUNITY): Payer: No Typology Code available for payment source | Admitting: Physical Therapy

## 2019-04-18 ENCOUNTER — Other Ambulatory Visit: Payer: Self-pay

## 2019-04-18 DIAGNOSIS — M6281 Muscle weakness (generalized): Secondary | ICD-10-CM

## 2019-04-18 DIAGNOSIS — M25562 Pain in left knee: Secondary | ICD-10-CM | POA: Diagnosis not present

## 2019-04-18 DIAGNOSIS — M25662 Stiffness of left knee, not elsewhere classified: Secondary | ICD-10-CM

## 2019-04-18 DIAGNOSIS — R600 Localized edema: Secondary | ICD-10-CM

## 2019-04-18 NOTE — Therapy (Signed)
[place], [place], [address] Phone: [phone]   Fax:  [phone]  Physical Therapy Treatment  Patient Details  Name: Renee Savage MRN: [id number] Date of Birth: Oct 06, 2000 Referring Provider (PT): Victorino December, MD   Encounter Date: 04/18/2019  PT End of Session - 04/18/19 0922    Visit Number  6    Number of Visits  12    Date for PT Re-Evaluation  05/05/19    Authorization Type  Generic commercial (90 visit limit combined PT/OT/ST)    Authorization Time Period  03/24/19 - 05/05/19. Progress note on visit 10.    Authorization - Visit Number  6    Authorization - Number of Visits  10    PT Start Time  [phone]    PT Stop Time  0956    PT Time Calculation (min)  40 min    Equipment Utilized During Treatment  Left knee immobilizer    Activity Tolerance  Patient tolerated treatment well    Behavior During Therapy  Anxious       Past Medical History:  Diagnosis Date  . Allergy    Chronic  . Angio-edema   . Anxiety   . Asthma   . Beta thalassemia ([place])   . Eczema   . IBS (irritable bowel syndrome)   . Reflux   . Urticaria     Past Surgical History:  Procedure Laterality Date  . CYST REMOVAL HAND    . WISDOM TOOTH EXTRACTION  2016    There were no vitals filed for this visit.  Subjective Assessment - 04/18/19 0919    Subjective  Reports minor pain and been walking around a bit more    Currently in Pain?  Yes    Pain Score  1     Pain Location  Knee    Pain Orientation  Left    Pain Descriptors / Indicators  Simonne Martinet PT Assessment - 04/18/19 0001      Assessment   Medical Diagnosis  S/P Lt arthroscopic medial patellofemoral ligament reconstruction with hamstring allograft    Referring Provider (PT)  Victorino December, MD    Onset Date/Surgical Date  03/16/19                   [facility] Adult PT Treatment/Exercise - 04/18/19 0001      Knee/Hip Exercises: Standing   Heel  Raises  Both;3 sets   20 seconds hold 2 hand hold   Heel Raises Limitations  with brace on     Hip Abduction  AROM;Left;2 sets;15 reps;Knee straight    Hip Extension  AROM;Left;2 sets;15 reps;Knee straight    Forward Step Up  Left;4 sets;5 reps;Hand Hold: 1;Step Height: 4"   right hand hold   Other Standing Knee Exercises  forward step taps no UE support 4" step 3x10 L     Other Standing Knee Exercises  Semi SLS - R toe down x6, 15" holds L      Knee/Hip Exercises: Seated   Other Seated Knee/Hip Exercises  tandem 2x30" B- on solid floor, tandem with cervical head rotations 2x5 bilateral and on each leg               PT Short Term Goals - 04/13/19 1006      PT SHORT TERM GOAL #1   Title  Patient will report understanding and regular compliance with HEP to improve  ROM, strength and overall mobility.    Time  3    Period  Weeks    Status  On-going    Target Date  04/14/19      PT SHORT TERM GOAL #2   Title  Within protocol, patient will demonstrate left knee extension/flexion active range of motion of at least 0-90 degrees to assist with more normalized gait pattern and stair ambulation.    Time  3    Period  Weeks    Status  On-going    Target Date  04/14/19        PT Long Term Goals - 04/13/19 1007      PT LONG TERM GOAL #1   Title  Patient will demonstrate ability to ambulate at a gait velocity of at least 1.0 m/s with LRAD in order to improve safety with community ambulation.    Time  6    Period  Weeks    Status  On-going      PT LONG TERM GOAL #2   Title  Within protocol, patient will demonstrate left knee extension/flexion active range of motion of at least 0-125 degrees to assist with more normalized gait pattern and stair ambulation.    Time  6    Period  Weeks    Status  On-going      PT LONG TERM GOAL #3   Title  Patient will demonstrate left lower extremity strength within functional limits in order to return to daily activities safely.    Time  6     Period  Weeks    Status  On-going      PT LONG TERM GOAL #4   Title  Patient will have circumferential measurement of left knee within 0.5-1 cm difference between the right knee indicating decreased edema for improved ease of mobility.    Time  6    Period  Weeks    Status  On-going      PT LONG TERM GOAL #5   Title  Patient will report ability to ambulate for at least 20 minutes without pain greater than 3 out of 10 in order to perform community activities such as grocery shopping with improved ease.    Time  6    Period  Weeks    Status  On-going            Plan - 04/18/19 0940    Clinical Impression Statement  Focused on adding in semi single leg activities on the left. This was tolerated well but patient did have some minor wobbliness in knee but otherwise tolerated everything well. Minor pain with SLS without UE or R LE support, patient not read for full SLS weightbearing at this time will follow up with next session. Will continue to progress functional strength as tolerated in brace.    Personal Factors and Comorbidities  Other   GAD   Examination-Activity Limitations  Bathing;Stand;Bed Mobility;Locomotion Level;Toileting;Bend;Transfers;Sit;Squat;Dressing;Hygiene/Grooming;Stairs    Examination-Participation Restrictions  Cleaning;Meal Prep;Community Activity;Driving;Shop;Laundry    Stability/Clinical Decision Making  Stable/Uncomplicated    Rehab Potential  Fair    PT Frequency  2x / week    PT Duration  6 weeks    PT Treatment/Interventions  ADLs/Self Care Home Management;Cryotherapy;Electrical Stimulation;Moist Heat;DME Instruction;Gait training;Stair training;Functional mobility training;Therapeutic activities;Therapeutic exercise;Balance training;Neuromuscular re-education;Patient/family education;Manual techniques;Passive range of motion;Dry needling;Energy conservation;Taping;Orthotic Fit/Training    PT Next Visit Plan  Progress per protocol (see below under  recommended other services).    PT Home Exercise Plan  Continue exercises provided at hospital, ICE/elevate and ankle pumps  12/21:  QS, ham set, SL hip Add 04/13/19 Heel raises 3x10    Consulted and Agree with Plan of Care  Patient       Patient will benefit from skilled therapeutic intervention in order to improve the following deficits and impairments:  Abnormal gait, Impaired sensation, Improper body mechanics, Pain, Decreased mobility, Decreased activity tolerance, Decreased endurance, Decreased range of motion, Decreased strength, Hypomobility, Decreased balance, Difficulty walking, Increased edema  Visit Diagnosis: Acute pain of left knee  Stiffness of left knee, not elsewhere classified  Localized edema  Muscle weakness (generalized)     Problem List Patient Active Problem List   Diagnosis Date Noted  . Generalized anxiety disorder 11/09/2017  . Chronic abdominal pain 10/16/2017    10:02 AM, 04/18/19 Tereasa Coop, DPT Physical Therapy with [facility]  [phone] office   [facility] [address] [place], [place], [address] Phone: [phone]   Fax:  [phone]  Name: TIFFINI BLACKSHER MRN: [id number] Date of Birth: [birth year]

## 2019-04-20 ENCOUNTER — Encounter (HOSPITAL_COMMUNITY): Payer: Self-pay | Admitting: Physical Therapy

## 2019-04-20 ENCOUNTER — Ambulatory Visit (HOSPITAL_COMMUNITY): Payer: No Typology Code available for payment source | Admitting: Physical Therapy

## 2019-04-20 ENCOUNTER — Other Ambulatory Visit: Payer: Self-pay

## 2019-04-20 DIAGNOSIS — M6281 Muscle weakness (generalized): Secondary | ICD-10-CM

## 2019-04-20 DIAGNOSIS — M25562 Pain in left knee: Secondary | ICD-10-CM

## 2019-04-20 DIAGNOSIS — M25662 Stiffness of left knee, not elsewhere classified: Secondary | ICD-10-CM

## 2019-04-20 DIAGNOSIS — R600 Localized edema: Secondary | ICD-10-CM

## 2019-04-20 NOTE — Therapy (Signed)
[facility] [address] [place], [place], [address] Phone: [phone]   Fax:  [phone]  Physical Therapy Treatment  Patient Details  Name: Renee Savage MRN: [id number] Date of Birth: 13-Aug-2000 Referring Provider (PT): Duwayne Heck, MD   Encounter Date: 04/20/2019  PT End of Session - 04/20/19 0920    Visit Number  7    Number of Visits  12    Date for PT Re-Evaluation  05/05/19    Authorization Type  Generic commercial (90 visit limit combined PT/OT/ST)    Authorization Time Period  03/24/19 - 05/05/19. Progress note on visit 10.    Authorization - Visit Number  7    Authorization - Number of Visits  10    PT Start Time  0915    PT Stop Time  0955    PT Time Calculation (min)  40 min    Equipment Utilized During Treatment  Left knee immobilizer    Activity Tolerance  Patient tolerated treatment well    Behavior During Therapy  Anxious       Past Medical History:  Diagnosis Date  . Allergy    Chronic  . Angio-edema   . Anxiety   . Asthma   . Beta thalassemia (HCC)   . Eczema   . IBS (irritable bowel syndrome)   . Reflux   . Urticaria     Past Surgical History:  Procedure Laterality Date  . CYST REMOVAL HAND    . WISDOM TOOTH EXTRACTION  2016    There were no vitals filed for this visit.  Subjective Assessment - 04/20/19 0918    Subjective  No pain and only minor soreness since last session.         [facility] PT Assessment - 04/20/19 0001      Assessment   Medical Diagnosis  S/P Lt arthroscopic medial patellofemoral ligament reconstruction with hamstring allograft    Referring Provider (PT)  Duwayne Heck, MD    Onset Date/Surgical Date  03/16/19                   [facility] Adult PT Treatment/Exercise - 04/20/19 0001      Knee/Hip Exercises: Standing   Heel Raises  --   2x12 5" holds double leg - UE support   Lateral Step Up  Both;3 sets;10 reps;Hand Hold: 0;Step Height: 2"    Forward Step Up   Left;2 sets;15 reps;Hand Hold: 1;Step Height: 6"   --> then wihtout UE - too wobbly ->4"-> 2" 2x15 no UE     Other Standing Knee Exercises  tandem balance on blue foam - occ UE support 3x30" B               PT Short Term Goals - 04/13/19 1006      PT SHORT TERM GOAL #1   Title  Patient will report understanding and regular compliance with HEP to improve ROM, strength and overall mobility.    Time  3    Period  Weeks    Status  On-going    Target Date  04/14/19      PT SHORT TERM GOAL #2   Title  Within protocol, patient will demonstrate left knee extension/flexion active range of motion of at least 0-90 degrees to assist with more normalized gait pattern and stair ambulation.    Time  3    Period  Weeks    Status  On-going    Target Date  04/14/19        PT Long Term Goals - 04/13/19 1007      PT LONG TERM GOAL #1   Title  Patient will demonstrate ability to ambulate at a gait velocity of at least 1.0 m/s with LRAD in order to improve safety with community ambulation.    Time  6    Period  Weeks    Status  On-going      PT LONG TERM GOAL #2   Title  Within protocol, patient will demonstrate left knee extension/flexion active range of motion of at least 0-125 degrees to assist with more normalized gait pattern and stair ambulation.    Time  6    Period  Weeks    Status  On-going      PT LONG TERM GOAL #3   Title  Patient will demonstrate left lower extremity strength within functional limits in order to return to daily activities safely.    Time  6    Period  Weeks    Status  On-going      PT LONG TERM GOAL #4   Title  Patient will have circumferential measurement of left knee within 0.5-1 cm difference between the right knee indicating decreased edema for improved ease of mobility.    Time  6    Period  Weeks    Status  On-going      PT LONG TERM GOAL #5   Title  Patient will report ability to ambulate for at least 20 minutes without pain greater than 3  out of 10 in order to perform community activities such as grocery shopping with improved ease.    Time  6    Period  Weeks    Status  On-going            Plan - 04/20/19 0920    Clinical Impression Statement  Patient tolerated session well. Able to perform step ups without UE support to 2 inch height step, unable to perform without increased instability at the knee with higher step. No pain noted with selected exercises. Improved balance noted during today's session, will continue to progress as able Patient would continue to benefit from skilled physical therapy at this time.    Personal Factors and Comorbidities  Other   GAD   Examination-Activity Limitations  Bathing;Stand;Bed Mobility;Locomotion Level;Toileting;Bend;Transfers;Sit;Squat;Dressing;Hygiene/Grooming;Stairs    Examination-Participation Restrictions  Cleaning;Meal Prep;Community Activity;Driving;Shop;Laundry    Stability/Clinical Decision Making  Stable/Uncomplicated    Rehab Potential  Fair    PT Frequency  2x / week    PT Duration  6 weeks    PT Treatment/Interventions  ADLs/Self Care Home Management;Cryotherapy;Electrical Stimulation;Moist Heat;DME Instruction;Gait training;Stair training;Functional mobility training;Therapeutic activities;Therapeutic exercise;Balance training;Neuromuscular re-education;Patient/family education;Manual techniques;Passive range of motion;Dry needling;Energy conservation;Taping;Orthotic Fit/Training    PT Next Visit Plan  Progress per protocol (see below under recommended other services).    PT Home Exercise Plan  Continue exercises provided at hospital, ICE/elevate and ankle pumps  12/21:  QS, ham set, SL hip Add 04/13/19 Heel raises 3x10    Consulted and Agree with Plan of Care  Patient       Patient will benefit from skilled therapeutic intervention in order to improve the following deficits and impairments:  Abnormal gait, Impaired sensation, Improper body mechanics, Pain, Decreased  mobility, Decreased activity tolerance, Decreased endurance, Decreased range of motion, Decreased strength, Hypomobility, Decreased balance, Difficulty walking, Increased edema  Visit Diagnosis: Acute pain of left knee  Stiffness of left knee, not elsewhere classified  Localized edema  Muscle weakness (generalized)     Problem List Patient Active Problem List   Diagnosis Date Noted  . Generalized anxiety disorder 11/09/2017  . Chronic abdominal pain 10/16/2017    9:55 AM, 04/20/19 Jerene Pitch, DPT Physical Therapy with [facility]  [phone] office   [place] [address] [place], [place], [address] Phone: [phone]   Fax:  [phone]  Name: Renee Savage MRN: [id number] Date of Birth: 2000-06-01

## 2019-04-22 ENCOUNTER — Other Ambulatory Visit: Payer: Self-pay

## 2019-04-22 ENCOUNTER — Encounter (HOSPITAL_COMMUNITY): Payer: Self-pay | Admitting: Physical Therapy

## 2019-04-22 ENCOUNTER — Ambulatory Visit (HOSPITAL_COMMUNITY): Payer: No Typology Code available for payment source | Admitting: Physical Therapy

## 2019-04-22 DIAGNOSIS — M6281 Muscle weakness (generalized): Secondary | ICD-10-CM

## 2019-04-22 DIAGNOSIS — R600 Localized edema: Secondary | ICD-10-CM

## 2019-04-22 DIAGNOSIS — R2689 Other abnormalities of gait and mobility: Secondary | ICD-10-CM

## 2019-04-22 DIAGNOSIS — M25562 Pain in left knee: Secondary | ICD-10-CM

## 2019-04-22 DIAGNOSIS — M25662 Stiffness of left knee, not elsewhere classified: Secondary | ICD-10-CM

## 2019-04-22 NOTE — Therapy (Signed)
[facility] [address] [place], [place], [address] Phone: [phone]   Fax:  [phone]  Physical Therapy Treatment  Patient Details  Name: Renee Savage MRN: [id number] Date of Birth: 05/20/2000 Referring Provider (PT): Duwayne Heck, MD   Encounter Date: 04/22/2019  PT End of Session - 04/22/19 0949    Visit Number  8    Number of Visits  12    Date for PT Re-Evaluation  05/05/19    Authorization Type  Generic commercial (90 visit limit combined PT/OT/ST)    Authorization Time Period  03/24/19 - 05/05/19. Progress note on visit 10.    Authorization - Visit Number  8    Authorization - Number of Visits  10    PT Start Time  0945    PT Stop Time  1023    PT Time Calculation (min)  38 min    Equipment Utilized During Treatment  Left knee immobilizer    Activity Tolerance  Patient tolerated treatment well    Behavior During Therapy  Anxious       Past Medical History:  Diagnosis Date  . Allergy    Chronic  . Angio-edema   . Anxiety   . Asthma   . Beta thalassemia (HCC)   . Eczema   . IBS (irritable bowel syndrome)   . Reflux   . Urticaria     Past Surgical History:  Procedure Laterality Date  . CYST REMOVAL HAND    . WISDOM TOOTH EXTRACTION  2016    There were no vitals filed for this visit.  Subjective Assessment - 04/22/19 0948    Subjective  Reported feeling good this session, no pain.    Currently in Pain?  No/denies                       [facility] Adult PT Treatment/Exercise - 04/22/19 0001      Knee/Hip Exercises: Standing   Heel Raises  --   2x12 5'' holds 3'' lowering. UE support PRN   Lateral Step Up  Both;3 sets;10 reps;Hand Hold: 0;Step Height: 2"    Forward Step Up  Left;2 sets;15 reps;Hand Hold: 0;Step Height: 2"    Other Standing Knee Exercises  tandem balance on blue foam - intermittent UE support 3x30" B      Knee/Hip Exercises: Supine   Knee Extension  AROM    Knee Extension  Limitations  0    Knee Flexion  AROM    Knee Flexion Limitations  103               PT Short Term Goals - 04/13/19 1006      PT SHORT TERM GOAL #1   Title  Patient will report understanding and regular compliance with HEP to improve ROM, strength and overall mobility.    Time  3    Period  Weeks    Status  On-going    Target Date  04/14/19      PT SHORT TERM GOAL #2   Title  Within protocol, patient will demonstrate left knee extension/flexion active range of motion of at least 0-90 degrees to assist with more normalized gait pattern and stair ambulation.    Time  3    Period  Weeks    Status  On-going    Target Date  04/14/19        PT Long Term Goals - 04/13/19 1007      PT  LONG TERM GOAL #1   Title  Patient will demonstrate ability to ambulate at a gait velocity of at least 1.0 m/s with LRAD in order to improve safety with community ambulation.    Time  6    Period  Weeks    Status  On-going      PT LONG TERM GOAL #2   Title  Within protocol, patient will demonstrate left knee extension/flexion active range of motion of at least 0-125 degrees to assist with more normalized gait pattern and stair ambulation.    Time  6    Period  Weeks    Status  On-going      PT LONG TERM GOAL #3   Title  Patient will demonstrate left lower extremity strength within functional limits in order to return to daily activities safely.    Time  6    Period  Weeks    Status  On-going      PT LONG TERM GOAL #4   Title  Patient will have circumferential measurement of left knee within 0.5-1 cm difference between the right knee indicating decreased edema for improved ease of mobility.    Time  6    Period  Weeks    Status  On-going      PT LONG TERM GOAL #5   Title  Patient will report ability to ambulate for at least 20 minutes without pain greater than 3 out of 10 in order to perform community activities such as grocery shopping with improved ease.    Time  6    Period  Weeks     Status  On-going            Plan - 04/22/19 4034    Clinical Impression Statement  Patient performed all exercises at slow controlled pace. Continued with established POC and following protocol. Patient plans to return to MD on Monday. Patient's LT knee AROM is found to be 0-103 degrees this session. Patient will be at 6 weeks next week from surgery. Plan to follow-up if brace can be fully unlocked at this time per protocol.    Personal Factors and Comorbidities  Other   GAD   Examination-Activity Limitations  Bathing;Stand;Bed Mobility;Locomotion Level;Toileting;Bend;Transfers;Sit;Squat;Dressing;Hygiene/Grooming;Stairs    Examination-Participation Restrictions  Cleaning;Meal Prep;Community Activity;Driving;Shop;Laundry    Stability/Clinical Decision Making  Stable/Uncomplicated    Rehab Potential  Fair    PT Frequency  2x / week    PT Duration  6 weeks    PT Treatment/Interventions  ADLs/Self Care Home Management;Cryotherapy;Electrical Stimulation;Moist Heat;DME Instruction;Gait training;Stair training;Functional mobility training;Therapeutic activities;Therapeutic exercise;Balance training;Neuromuscular re-education;Patient/family education;Manual techniques;Passive range of motion;Dry needling;Energy conservation;Taping;Orthotic Fit/Training    PT Next Visit Plan  Progress per protocol (see below under recommended other services). F/U regarding MD appointment. Progress step-ups to 4'' step as able.    PT Home Exercise Plan  Continue exercises provided at hospital, ICE/elevate and ankle pumps  12/21:  QS, ham set, SL hip Add 04/13/19 Heel raises 3x10    Consulted and Agree with Plan of Care  Patient       Patient will benefit from skilled therapeutic intervention in order to improve the following deficits and impairments:  Abnormal gait, Impaired sensation, Improper body mechanics, Pain, Decreased mobility, Decreased activity tolerance, Decreased endurance, Decreased range of motion,  Decreased strength, Hypomobility, Decreased balance, Difficulty walking, Increased edema  Visit Diagnosis: Acute pain of left knee  Stiffness of left knee, not elsewhere classified  Localized edema  Muscle weakness (generalized)  Other abnormalities  of gait and mobility     Problem List Patient Active Problem List   Diagnosis Date Noted  . Generalized anxiety disorder 11/09/2017  . Chronic abdominal pain 10/16/2017   Clarene Critchley PT, DPT 10:29 AM, 04/22/19 [place] [address] [place], [place], [address] Phone: [phone]   Fax:  [phone]  Name: Renee Savage MRN: [id number] Date of Birth: 2001-03-03

## 2019-04-25 ENCOUNTER — Other Ambulatory Visit: Payer: Self-pay

## 2019-04-25 ENCOUNTER — Encounter (HOSPITAL_COMMUNITY): Payer: Self-pay | Admitting: Physical Therapy

## 2019-04-25 ENCOUNTER — Ambulatory Visit (HOSPITAL_COMMUNITY): Payer: No Typology Code available for payment source | Admitting: Physical Therapy

## 2019-04-25 DIAGNOSIS — M25562 Pain in left knee: Secondary | ICD-10-CM

## 2019-04-25 DIAGNOSIS — R600 Localized edema: Secondary | ICD-10-CM

## 2019-04-25 DIAGNOSIS — M6281 Muscle weakness (generalized): Secondary | ICD-10-CM

## 2019-04-25 DIAGNOSIS — M25662 Stiffness of left knee, not elsewhere classified: Secondary | ICD-10-CM

## 2019-04-25 NOTE — Therapy (Signed)
[facility] [address] [place], [place], [address] Phone: [phone]   Fax:  [phone]  Physical Therapy Treatment  Patient Details  Name: Renee Savage MRN: [id number] Date of Birth: 01-22-2001 Referring Provider (PT): Duwayne Heck, MD   Encounter Date: 04/25/2019  PT End of Session - 04/25/19 0919    Visit Number  9    Number of Visits  12    Date for PT Re-Evaluation  05/05/19    Authorization Type  Generic commercial (90 visit limit combined PT/OT/ST)    Authorization Time Period  03/24/19 - 05/05/19. Progress note on visit 10.    Authorization - Visit Number  9    Authorization - Number of Visits  10    PT Start Time  0919    PT Stop Time  0958    PT Time Calculation (min)  39 min    Equipment Utilized During Treatment  Left knee immobilizer    Activity Tolerance  Patient tolerated treatment well    Behavior During Therapy  Anxious       Past Medical History:  Diagnosis Date  . Allergy    Chronic  . Angio-edema   . Anxiety   . Asthma   . Beta thalassemia (HCC)   . Eczema   . IBS (irritable bowel syndrome)   . Reflux   . Urticaria     Past Surgical History:  Procedure Laterality Date  . CYST REMOVAL HAND    . WISDOM TOOTH EXTRACTION  2016    There were no vitals filed for this visit.  Subjective Assessment - 04/25/19 0921    Subjective  No pain or soreness since last session. HEP going well         [facility] PT Assessment - 04/25/19 0001      Assessment   Medical Diagnosis  S/P Lt arthroscopic medial patellofemoral ligament reconstruction with hamstring allograft    Referring Provider (PT)  Duwayne Heck, MD    Onset Date/Surgical Date  03/16/19    Next MD Visit  04/25/19                   [facility] Adult PT Treatment/Exercise - 04/25/19 0001      Knee/Hip Exercises: Standing   Lateral Step Up  Step Height: 4";Hand Hold: 0;3 sets;10 reps;Both    Forward Step Up  2 sets;15 reps;Hand Hold:  0;Step Height: 4";Left    Other Standing Knee Exercises  walking backwards with pulley resistance - x10 1 weight plate, then lateral x5 bilateral     Other Standing Knee Exercises  mini squats 3x10, cues to perform hip/knee motion together.              PT Education - 04/25/19 [phone]    Education Details  on how elevation helps with edema and ice helps with pain but not swelling.    Person(s) Educated  Patient    Methods  Explanation    Comprehension  Verbalized understanding       PT Short Term Goals - 04/13/19 1006      PT SHORT TERM GOAL #1   Title  Patient will report understanding and regular compliance with HEP to improve ROM, strength and overall mobility.    Time  3    Period  Weeks    Status  On-going    Target Date  04/14/19      PT SHORT TERM GOAL #2   Title  Within protocol,  patient will demonstrate left knee extension/flexion active range of motion of at least 0-90 degrees to assist with more normalized gait pattern and stair ambulation.    Time  3    Period  Weeks    Status  On-going    Target Date  04/14/19        PT Long Term Goals - 04/13/19 1007      PT LONG TERM GOAL #1   Title  Patient will demonstrate ability to ambulate at a gait velocity of at least 1.0 m/s with LRAD in order to improve safety with community ambulation.    Time  6    Period  Weeks    Status  On-going      PT LONG TERM GOAL #2   Title  Within protocol, patient will demonstrate left knee extension/flexion active range of motion of at least 0-125 degrees to assist with more normalized gait pattern and stair ambulation.    Time  6    Period  Weeks    Status  On-going      PT LONG TERM GOAL #3   Title  Patient will demonstrate left lower extremity strength within functional limits in order to return to daily activities safely.    Time  6    Period  Weeks    Status  On-going      PT LONG TERM GOAL #4   Title  Patient will have circumferential measurement of left knee within  0.5-1 cm difference between the right knee indicating decreased edema for improved ease of mobility.    Time  6    Period  Weeks    Status  On-going      PT LONG TERM GOAL #5   Title  Patient will report ability to ambulate for at least 20 minutes without pain greater than 3 out of 10 in order to perform community activities such as grocery shopping with improved ease.    Time  6    Period  Weeks    Status  On-going            Plan - 04/25/19 1062    Clinical Impression Statement  Advanced patient to higher step with step ups and no UE assist. This was tolerated well. Minor reports of stiffness in knee but no pain. Educated patient in use of elevation to help combat swelling as patient has been icing for swelling. Added mini squats to program, initially patient performed movement very disjointed (separating the hip and knee motions instead of performing them simultaneously).With verbal cues this corrected. Patient did report more uncertainty on her right leg with her exercises. Patient to follow up with MD today, will follow up with patient about MD apt next session.    Personal Factors and Comorbidities  Other   GAD   Examination-Activity Limitations  Bathing;Stand;Bed Mobility;Locomotion Level;Toileting;Bend;Transfers;Sit;Squat;Dressing;Hygiene/Grooming;Stairs    Examination-Participation Restrictions  Cleaning;Meal Prep;Community Activity;Driving;Shop;Laundry    Stability/Clinical Decision Making  Stable/Uncomplicated    Rehab Potential  Fair    PT Frequency  2x / week    PT Duration  6 weeks    PT Treatment/Interventions  ADLs/Self Care Home Management;Cryotherapy;Electrical Stimulation;Moist Heat;DME Instruction;Gait training;Stair training;Functional mobility training;Therapeutic activities;Therapeutic exercise;Balance training;Neuromuscular re-education;Patient/family education;Manual techniques;Passive range of motion;Dry needling;Energy conservation;Taping;Orthotic Fit/Training     PT Next Visit Plan  MD visit today - FU with patient. Progress per protocol (see below under recommended other services). F/U regarding MD appointment. Progress step-ups to 4'' step as able.    PT  Home Exercise Plan  Continue exercises provided at hospital, ICE/elevate and ankle pumps  12/21:  QS, ham set, SL hip Add 04/13/19 Heel raises 3x10    Consulted and Agree with Plan of Care  Patient       Patient will benefit from skilled therapeutic intervention in order to improve the following deficits and impairments:  Abnormal gait, Impaired sensation, Improper body mechanics, Pain, Decreased mobility, Decreased activity tolerance, Decreased endurance, Decreased range of motion, Decreased strength, Hypomobility, Decreased balance, Difficulty walking, Increased edema  Visit Diagnosis: Acute pain of left knee  Stiffness of left knee, not elsewhere classified  Localized edema  Muscle weakness (generalized)     Problem List Patient Active Problem List   Diagnosis Date Noted  . Generalized anxiety disorder 11/09/2017  . Chronic abdominal pain 10/16/2017  9:53 AM, 04/25/19 Jerene Pitch, DPT Physical Therapy with [facility]  [phone] office  [place] [address] [place], [place], [address] Phone: [phone]   Fax:  [phone]  Name: Renee Savage MRN: [id number] Date of Birth: 2000-06-30

## 2019-04-27 ENCOUNTER — Other Ambulatory Visit: Payer: Self-pay

## 2019-04-27 ENCOUNTER — Encounter (HOSPITAL_COMMUNITY): Payer: Self-pay | Admitting: Physical Therapy

## 2019-04-27 ENCOUNTER — Ambulatory Visit (HOSPITAL_COMMUNITY): Payer: No Typology Code available for payment source | Admitting: Physical Therapy

## 2019-04-27 DIAGNOSIS — M6281 Muscle weakness (generalized): Secondary | ICD-10-CM

## 2019-04-27 DIAGNOSIS — M25562 Pain in left knee: Secondary | ICD-10-CM

## 2019-04-27 DIAGNOSIS — M25662 Stiffness of left knee, not elsewhere classified: Secondary | ICD-10-CM

## 2019-04-27 DIAGNOSIS — R600 Localized edema: Secondary | ICD-10-CM

## 2019-04-27 NOTE — Patient Instructions (Signed)
  First 3 weeks - use warm up and cool down for stretches Starting at week 4 warm up and cool down is regular paced walk and walking portion becomes a purposeful walk as if you are hustling to class

## 2019-04-27 NOTE — Therapy (Signed)
[place], [place], [address] Phone: [phone]   Fax:  [phone]  Physical Therapy Treatment, Progress Note and RECERT  Patient Details  Name: Renee Savage MRN: [id number] Date of Birth: Sep 08, 2000 Referring Provider (PT): Victorino December, MD  Progress Note Reporting Period 03/24/19 to 04/27/19  See note below for Objective Data and Assessment of Progress/Goals.       Encounter Date: 04/27/2019  PT End of Session - 04/27/19 0916    Visit Number  10    Number of Visits  20    Date for PT Re-Evaluation  06/02/19    Authorization Type  Generic commercial (90 visit limit combined PT/OT/ST)    Authorization Time Period  03/24/19 - 05/05/19. Progress note on visit 10. ADDITIONAL 4 weeks added ONTO original CERT --> NEW CERT DATES 94/70/96 to 06/02/19    Authorization - Visit Number  10    Authorization - Number of Visits  10    PT Start Time  0916    PT Stop Time  0956    PT Time Calculation (min)  40 min    Equipment Utilized During Treatment  Left knee immobilizer    Activity Tolerance  Patient tolerated treatment well    Behavior During Therapy  Anxious       Past Medical History:  Diagnosis Date  . Allergy    Chronic  . Angio-edema   . Anxiety   . Asthma   . Beta thalassemia ([place])   . Eczema   . IBS (irritable bowel syndrome)   . Reflux   . Urticaria     Past Surgical History:  Procedure Laterality Date  . CYST REMOVAL HAND    . WISDOM TOOTH EXTRACTION  2016    There were no vitals filed for this visit.  Subjective Assessment - 04/27/19 0919    Subjective  Went to MD and MD said to transition off of crutch first and then out of brace as able and to be determined by PT. MD states she should not wear brace to bed and her knee flared up. States that yesterday she had discomfort/difficulty keeping leg straight wiht SLR exercise.    Currently in Pain?  No/denies         [facility] PT Assessment  - 04/27/19 0001      Assessment   Medical Diagnosis  S/P Lt arthroscopic medial patellofemoral ligament reconstruction with hamstring allograft    Referring Provider (PT)  Victorino December, MD    Onset Date/Surgical Date  03/16/19    Next MD Visit  06/09/19      Observation/Other Assessments   Focus on Therapeutic Outcomes (FOTO)   43% limited   was 57% limited                  [facility] Adult PT Treatment/Exercise - 04/27/19 0001      Ambulation/Gait   Ambulation/Gait  Yes    Ambulation/Gait Assistance  5: Supervision    Ambulation Distance (Feet)  258 Feet    Gait velocity  .614ms    Gait Comments  2MW with crutch 2555ft, without crutch 258 feet       Knee/Hip Exercises: Standing   Forward Step Up  Left;Hand Hold: 0;Step Height: 6";5 reps;5 sets    Rocker Board Limitations  --   lateral, 30" holds x4      Knee/Hip Exercises: Supine   Knee Extension  AROM;Left    Knee Extension Limitations  0    Knee Flexion  AROM;Left    Knee Flexion Limitations  106   mild discomfort            PT Education - 04/27/19 0935    Education Details  on walking program and discontiuning use of crutch.    Person(s) Educated  Patient    Methods  Explanation    Comprehension  Verbalized understanding       PT Short Term Goals - 04/27/19 0954      PT SHORT TERM GOAL #1   Title  Patient will report understanding and regular compliance with HEP to improve ROM, strength and overall mobility.    Baseline  1/20 - doing HEP daily either 1x or 2x/day    Time  3    Period  Weeks    Status  Achieved    Target Date  04/14/19      PT SHORT TERM GOAL #2   Title  Within protocol, patient will demonstrate left knee extension/flexion active range of motion of at least 0-90 degrees to assist with more normalized gait pattern and stair ambulation.    Baseline  1/20 0-106    Time  3    Period  Weeks    Status  Achieved    Target Date  04/14/19        PT Long Term Goals - 04/27/19  0923      PT LONG TERM GOAL #1   Title  Patient will demonstrate ability to ambulate at a gait velocity of at least 1.0 m/s with LRAD in order to improve safety with community ambulation.    Baseline  1/20: 0.655 m/s    Time  6    Period  Weeks    Status  On-going      PT LONG TERM GOAL #2   Title  Within protocol, patient will demonstrate left knee extension/flexion active range of motion of at least 0-125 degrees to assist with more normalized gait pattern and stair ambulation.    Baseline  1/20 0-106    Time  6    Period  Weeks    Status  On-going      PT LONG TERM GOAL #3   Title  Patient will demonstrate left lower extremity strength within functional limits in order to return to daily activities safely.    Baseline  Not formally assessed on this date    Time  6    Period  Weeks    Status  On-going      PT LONG TERM GOAL #4   Title  Patient will have circumferential measurement of left knee within 0.5-1 cm difference between the right knee indicating decreased edema for improved ease of mobility.    Time  6    Period  Weeks    Status  On-going      PT LONG TERM GOAL #5   Title  Patient will report ability to ambulate for at least 20 minutes without pain greater than 3 out of 10 in order to perform community activities such as grocery shopping with improved ease.    Time  6    Period  Weeks    Status  On-going            Plan - 04/27/19 0917    Clinical Impression Statement  Focused today on education and reviewing goals/ progress since start of session. Patient has met all short term goals at this time and has met and  is working towards her long term goals. Improvement in FOTO noted from 57% limitation to 43% limitation. MD instructed patient to start to transition off of crutch and then out of brace with PT guidance. Instructed patient on this date to discontinue use of crutch secondary to improved distance covered without crutch during 2 minute walk test, no pain and  no change in gait mechanics when crutch is not used. Extending POC additional 4 weeks onto original POC to continue to work towards remaining goals and improve overall function.    Personal Factors and Comorbidities  Other   GAD   Examination-Activity Limitations  Bathing;Stand;Bed Mobility;Locomotion Level;Toileting;Bend;Transfers;Sit;Squat;Dressing;Hygiene/Grooming;Stairs    Examination-Participation Restrictions  Cleaning;Meal Prep;Community Activity;Driving;Shop;Laundry    Stability/Clinical Decision Making  Stable/Uncomplicated    Rehab Potential  Fair    PT Frequency  2x / week    PT Duration  4 weeks    PT Treatment/Interventions  ADLs/Self Care Home Management;Cryotherapy;Electrical Stimulation;Moist Heat;DME Instruction;Gait training;Stair training;Functional mobility training;Therapeutic activities;Therapeutic exercise;Balance training;Neuromuscular re-education;Patient/family education;Manual techniques;Passive range of motion;Dry needling;Energy conservation;Taping;Orthotic Fit/Training    PT Next Visit Plan  MD visit today - FU with patient. Progress per protocol (see below under recommended other services). F/U regarding MD appointment. Progress step-ups to 4'' step as able.    PT Home Exercise Plan  Continue exercises provided at hospital, ICE/elevate and ankle pumps  12/21:  QS, ham set, SL hip Add 04/13/19 Heel raises 3x10    Consulted and Agree with Plan of Care  Patient       Patient will benefit from skilled therapeutic intervention in order to improve the following deficits and impairments:  Abnormal gait, Impaired sensation, Improper body mechanics, Pain, Decreased mobility, Decreased activity tolerance, Decreased endurance, Decreased range of motion, Decreased strength, Hypomobility, Decreased balance, Difficulty walking, Increased edema  Visit Diagnosis: Acute pain of left knee  Stiffness of left knee, not elsewhere classified  Localized edema  Muscle weakness  (generalized)     Problem List Patient Active Problem List   Diagnosis Date Noted  . Generalized anxiety disorder 11/09/2017  . Chronic abdominal pain 10/16/2017    11:16 AM, 04/27/19 Jerene Pitch, DPT Physical Therapy with [facility]  [phone] office  [place] [address] [place], [place], [address] Phone: [phone]   Fax:  [phone]  Name: Renee Savage MRN: [id number] Date of Birth: 10-05-2000

## 2019-04-29 ENCOUNTER — Ambulatory Visit (HOSPITAL_COMMUNITY): Payer: No Typology Code available for payment source | Admitting: Physical Therapy

## 2019-04-29 ENCOUNTER — Other Ambulatory Visit: Payer: Self-pay

## 2019-04-29 DIAGNOSIS — M25562 Pain in left knee: Secondary | ICD-10-CM | POA: Diagnosis not present

## 2019-04-29 DIAGNOSIS — R600 Localized edema: Secondary | ICD-10-CM

## 2019-04-29 DIAGNOSIS — M6281 Muscle weakness (generalized): Secondary | ICD-10-CM

## 2019-04-29 DIAGNOSIS — M25662 Stiffness of left knee, not elsewhere classified: Secondary | ICD-10-CM

## 2019-04-29 DIAGNOSIS — R2689 Other abnormalities of gait and mobility: Secondary | ICD-10-CM

## 2019-04-29 NOTE — Therapy (Signed)
[facility] [address] [place], [place], [address] Phone: [phone]   Fax:  [phone]  Physical Therapy Treatment  Patient Details  Name: Renee Savage MRN: [id number] Date of Birth: 2000-10-27 Referring Provider (PT): Duwayne Heck, MD   Encounter Date: 04/29/2019  PT End of Session - 04/29/19 1032    Visit Number  11    Number of Visits  20    Date for PT Re-Evaluation  06/02/19    Authorization Type  Generic commercial (90 visit limit combined PT/OT/ST)    Authorization Time Period  03/24/19 - 05/05/19. Progress note on visit 10. ADDITIONAL 4 weeks added ONTO original CERT --> NEW CERT DATES 97/98/92 to 06/02/19    Authorization - Visit Number  1    Authorization - Number of Visits  10    PT Start Time  [phone]    PT Stop Time  1031    PT Time Calculation (min)  40 min    Equipment Utilized During Treatment  Left knee immobilizer    Activity Tolerance  Patient tolerated treatment well    Behavior During Therapy  Anxious       Past Medical History:  Diagnosis Date  . Allergy    Chronic  . Angio-edema   . Anxiety   . Asthma   . Beta thalassemia (HCC)   . Eczema   . IBS (irritable bowel syndrome)   . Reflux   . Urticaria     Past Surgical History:  Procedure Laterality Date  . CYST REMOVAL HAND    . WISDOM TOOTH EXTRACTION  2016    There were no vitals filed for this visit.  Subjective Assessment - 04/29/19 0954    Subjective  Patient reported a little knee pain due to nearly tripping when getting out of the car.    Currently in Pain?  Yes    Pain Score  2     Pain Location  Knee    Pain Orientation  Left    Pain Descriptors / Indicators  Aching                       [facility] Adult PT Treatment/Exercise - 04/29/19 0001      Ambulation/Gait   Ambulation/Gait  Yes    Ambulation/Gait Assistance  5: Supervision    Ambulation Distance (Feet)  226 Feet    Gait Comments  No crutch with brace      Knee/Hip Exercises: Standing   Heel Raises  15 reps    Heel Raises Limitations  Slow lowering 3''     Forward Step Up  Left;Hand Hold: 0;Step Height: 6";5 reps;5 sets    Other Standing Knee Exercises  lateral weighted walking 10# x5 bilateral     Other Standing Knee Exercises  mini squats 3x10, cues to perform hip/knee motion together.                PT Short Term Goals - 04/27/19 0954      PT SHORT TERM GOAL #1   Title  Patient will report understanding and regular compliance with HEP to improve ROM, strength and overall mobility.    Baseline  1/20 - doing HEP daily either 1x or 2x/day    Time  3    Period  Weeks    Status  Achieved    Target Date  04/14/19      PT SHORT TERM GOAL #2   Title  Within  protocol, patient will demonstrate left knee extension/flexion active range of motion of at least 0-90 degrees to assist with more normalized gait pattern and stair ambulation.    Baseline  1/20 0-106    Time  3    Period  Weeks    Status  Achieved    Target Date  04/14/19        PT Long Term Goals - 04/27/19 0923      PT LONG TERM GOAL #1   Title  Patient will demonstrate ability to ambulate at a gait velocity of at least 1.0 m/s with LRAD in order to improve safety with community ambulation.    Baseline  1/20: 0.655 m/s    Time  6    Period  Weeks    Status  On-going      PT LONG TERM GOAL #2   Title  Within protocol, patient will demonstrate left knee extension/flexion active range of motion of at least 0-125 degrees to assist with more normalized gait pattern and stair ambulation.    Baseline  1/20 0-106    Time  6    Period  Weeks    Status  On-going      PT LONG TERM GOAL #3   Title  Patient will demonstrate left lower extremity strength within functional limits in order to return to daily activities safely.    Baseline  Not formally assessed on this date    Time  6    Period  Weeks    Status  On-going      PT LONG TERM GOAL #4   Title  Patient will  have circumferential measurement of left knee within 0.5-1 cm difference between the right knee indicating decreased edema for improved ease of mobility.    Time  6    Period  Weeks    Status  On-going      PT LONG TERM GOAL #5   Title  Patient will report ability to ambulate for at least 20 minutes without pain greater than 3 out of 10 in order to perform community activities such as grocery shopping with improved ease.    Time  6    Period  Weeks    Status  On-going            Plan - 04/29/19 1037    Clinical Impression Statement  Patient with report of near tripping prior to arriving in session. Examined knee with no excessive noted redness or swelling. Continued with established POC modifying as needed to patient's tolerance. Resumed lateral walking this session and continued increased step height with step-ups. Patient would benefit from continued skilled physical therapy to continue progressing towards functional goals.    Personal Factors and Comorbidities  Other   GAD   Examination-Activity Limitations  Bathing;Stand;Bed Mobility;Locomotion Level;Toileting;Bend;Transfers;Sit;Squat;Dressing;Hygiene/Grooming;Stairs    Examination-Participation Restrictions  Cleaning;Meal Prep;Community Activity;Driving;Shop;Laundry    Stability/Clinical Decision Making  Stable/Uncomplicated    Rehab Potential  Fair    PT Frequency  2x / week    PT Duration  4 weeks    PT Treatment/Interventions  ADLs/Self Care Home Management;Cryotherapy;Electrical Stimulation;Moist Heat;DME Instruction;Gait training;Stair training;Functional mobility training;Therapeutic activities;Therapeutic exercise;Balance training;Neuromuscular re-education;Patient/family education;Manual techniques;Passive range of motion;Dry needling;Energy conservation;Taping;Orthotic Fit/Training    PT Next Visit Plan  MD visit today - FU with patient. Progress per protocol (see below under recommended other services).    PT Home  Exercise Plan  Continue exercises provided at hospital, ICE/elevate and ankle pumps  12/21:  QS, ham  set, SL hip Add 04/13/19 Heel raises 3x10    Consulted and Agree with Plan of Care  Patient       Patient will benefit from skilled therapeutic intervention in order to improve the following deficits and impairments:  Abnormal gait, Impaired sensation, Improper body mechanics, Pain, Decreased mobility, Decreased activity tolerance, Decreased endurance, Decreased range of motion, Decreased strength, Hypomobility, Decreased balance, Difficulty walking, Increased edema  Visit Diagnosis: Acute pain of left knee  Stiffness of left knee, not elsewhere classified  Localized edema  Muscle weakness (generalized)  Other abnormalities of gait and mobility     Problem List Patient Active Problem List   Diagnosis Date Noted  . Generalized anxiety disorder 11/09/2017  . Chronic abdominal pain 10/16/2017   Verne Carrow PT, DPT 10:38 AM, 04/29/19 [phone]    [facility] [address] [place], [place], [address] Phone: [phone]   Fax:  [phone]  Name: Renee Savage MRN: [id number] Date of Birth: 09-Jan-2001

## 2019-05-02 ENCOUNTER — Ambulatory Visit (HOSPITAL_COMMUNITY): Payer: No Typology Code available for payment source | Admitting: Physical Therapy

## 2019-05-04 ENCOUNTER — Encounter (HOSPITAL_COMMUNITY): Payer: Self-pay

## 2019-05-04 ENCOUNTER — Ambulatory Visit (HOSPITAL_COMMUNITY): Payer: No Typology Code available for payment source

## 2019-05-04 ENCOUNTER — Encounter: Payer: Self-pay | Admitting: Family Medicine

## 2019-05-04 ENCOUNTER — Other Ambulatory Visit: Payer: Self-pay

## 2019-05-04 DIAGNOSIS — R600 Localized edema: Secondary | ICD-10-CM

## 2019-05-04 DIAGNOSIS — M6281 Muscle weakness (generalized): Secondary | ICD-10-CM

## 2019-05-04 DIAGNOSIS — M25562 Pain in left knee: Secondary | ICD-10-CM

## 2019-05-04 DIAGNOSIS — M25662 Stiffness of left knee, not elsewhere classified: Secondary | ICD-10-CM

## 2019-05-04 NOTE — Therapy (Signed)
[facility] [address] [place], [place], [address] Phone: [phone]   Fax:  [phone]  Physical Therapy Treatment  Patient Details  Name: Renee Savage MRN: [id number] Date of Birth: 08/22/2000 Referring Provider (PT): Duwayne Heck, MD   Encounter Date: 05/04/2019  PT End of Session - 05/04/19 0923    Visit Number  12    Number of Visits  20    Date for PT Re-Evaluation  06/02/19    Authorization Type  Generic commercial (90 visit limit combined PT/OT/ST)    Authorization Time Period  03/24/19 - 05/05/19. Progress note on visit 10. ADDITIONAL 4 weeks added ONTO original CERT --> NEW CERT DATES 28/36/62 to 06/02/19    Authorization - Visit Number  2    Authorization - Number of Visits  10    PT Start Time  0919    PT Stop Time  1000    PT Time Calculation (min)  41 min    Equipment Utilized During Treatment  Left knee immobilizer    Activity Tolerance  Patient tolerated treatment well    Behavior During Therapy  WFL for tasks assessed/performed       Past Medical History:  Diagnosis Date  . Allergy    Chronic  . Angio-edema   . Anxiety   . Asthma   . Beta thalassemia (HCC)   . Eczema   . IBS (irritable bowel syndrome)   . Reflux   . Urticaria     Past Surgical History:  Procedure Laterality Date  . CYST REMOVAL HAND    . WISDOM TOOTH EXTRACTION  2016    There were no vitals filed for this visit.  Subjective Assessment - 05/04/19 0921    Subjective  Pt went to MD on Monday.  Reports doctor is happy wiht progress, encouraged pt to ice knee more regularly to address swelling.  Stated able to remove brace during therapy    Pertinent History  History of 5 years of dislocation prior to surgery    Patient Stated Goals  To walk better and be able to bend knee    Currently in Pain?  No/denies         [facility] PT Assessment - 05/04/19 0001      Assessment   Medical Diagnosis  S/P Lt arthroscopic medial patellofemoral  ligament reconstruction with hamstring allograft    Referring Provider (PT)  Duwayne Heck, MD    Onset Date/Surgical Date  03/16/19    Next MD Visit  06/09/19    Prior Therapy  Yes, 5 years ago for knee pain      Precautions   Precautions  Knee    Required Braces or Orthoses  Knee Immobilizer - Left    Knee Immobilizer - Left  --   Removal of brace with gait and closed chain therex     Restrictions   Weight Bearing Restrictions  Yes    LLE Weight Bearing  Weight bearing as tolerated                   [facility] Adult PT Treatment/Exercise - 05/04/19 0001      Ambulation/Gait   Ambulation/Gait  Yes    Ambulation/Gait Assistance  5: Supervision    Ambulation Distance (Feet)  226 Feet    Gait Comments  No AD with and without brace      Knee/Hip Exercises: Standing   Heel Raises  15 reps    Heel Raises Limitations  Slow lowering 3''     Lateral Step Up  Step Height: 4";Hand Hold: 0;10 reps;Both    Forward Step Up  Left;Hand Hold: 0;Step Height: 6"    Other Standing Knee Exercises  mini squats 3x10, cues to perform hip/knee motion together.       Knee/Hip Exercises: Supine   Knee Flexion  AROM;Left    Knee Flexion Limitations  115             PT Education - 05/04/19 0945    Education Details  Adjusted brace to open/full ROM.  Discussed weaning out of brace    Person(s) Educated  Patient    Methods  Explanation    Comprehension  Verbalized understanding       PT Short Term Goals - 04/27/19 0954      PT SHORT TERM GOAL #1   Title  Patient will report understanding and regular compliance with HEP to improve ROM, strength and overall mobility.    Baseline  1/20 - doing HEP daily either 1x or 2x/day    Time  3    Period  Weeks    Status  Achieved    Target Date  04/14/19      PT SHORT TERM GOAL #2   Title  Within protocol, patient will demonstrate left knee extension/flexion active range of motion of at least 0-90 degrees to assist with more normalized gait  pattern and stair ambulation.    Baseline  1/20 0-106    Time  3    Period  Weeks    Status  Achieved    Target Date  04/14/19        PT Long Term Goals - 04/27/19 0923      PT LONG TERM GOAL #1   Title  Patient will demonstrate ability to ambulate at a gait velocity of at least 1.0 m/s with LRAD in order to improve safety with community ambulation.    Baseline  1/20: 0.655 m/s    Time  6    Period  Weeks    Status  On-going      PT LONG TERM GOAL #2   Title  Within protocol, patient will demonstrate left knee extension/flexion active range of motion of at least 0-125 degrees to assist with more normalized gait pattern and stair ambulation.    Baseline  1/20 0-106    Time  6    Period  Weeks    Status  On-going      PT LONG TERM GOAL #3   Title  Patient will demonstrate left lower extremity strength within functional limits in order to return to daily activities safely.    Baseline  Not formally assessed on this date    Time  6    Period  Weeks    Status  On-going      PT LONG TERM GOAL #4   Title  Patient will have circumferential measurement of left knee within 0.5-1 cm difference between the right knee indicating decreased edema for improved ease of mobility.    Time  6    Period  Weeks    Status  On-going      PT LONG TERM GOAL #5   Title  Patient will report ability to ambulate for at least 20 minutes without pain greater than 3 out of 10 in order to perform community activities such as grocery shopping with improved ease.    Time  6    Period  Weeks  Status  On-going            Plan - 05/04/19 1906    Clinical Impression Statement  Pt at 7 weeks post-op.  Opened knee brace to full range per protocol.  Progressed functional strengthening this session with no reports of increased pain through session.  Noted visible musculature fatigue with standing exercises.  Improved AROM 115 degrees- begin bike next session.    Personal Factors and Comorbidities  Other    GAD   Examination-Activity Limitations  Bathing;Stand;Bed Mobility;Locomotion Level;Toileting;Bend;Transfers;Sit;Squat;Dressing;Hygiene/Grooming;Stairs    Examination-Participation Restrictions  Cleaning;Meal Prep;Community Activity;Driving;Shop;Laundry    Stability/Clinical Decision Making  Stable/Uncomplicated    Clinical Decision Making  Low    Rehab Potential  Fair    PT Frequency  2x / week    PT Duration  4 weeks    PT Treatment/Interventions  ADLs/Self Care Home Management;Cryotherapy;Electrical Stimulation;Moist Heat;DME Instruction;Gait training;Stair training;Functional mobility training;Therapeutic activities;Therapeutic exercise;Balance training;Neuromuscular re-education;Patient/family education;Manual techniques;Passive range of motion;Dry needling;Energy conservation;Taping;Orthotic Fit/Training    PT Next Visit Plan  Continue per protocol for week 7.  (see below).  May begin bike as ROM goal acheived.  Per pt./MD stated can begin to wean out of brace as appropriate.    PT Home Exercise Plan  Continue exercises provided at hospital, ICE/elevate and ankle pumps  12/21:  QS, ham set, SL hip Add 04/13/19 Heel raises 3x10    Recommended Other Services  Protocol:  phase 1 (to 2 weeks) TDWB brace and ROM 0-45, sup/inf patellar mobs (no laterals), QS/HS/SLR/ sidelying Add    Phase 2: 3-5 weeks, PWB, brace and ROM O-90, SL balance with brace, heelraises, core strengthening   Phase 3: 6 weeks FWB, Full ROM with brace open, patellar mobs, bike when ROM >115, minisquats, progress core strengthening, sport cord       Patient will benefit from skilled therapeutic intervention in order to improve the following deficits and impairments:  Abnormal gait, Impaired sensation, Improper body mechanics, Pain, Decreased mobility, Decreased activity tolerance, Decreased endurance, Decreased range of motion, Decreased strength, Hypomobility, Decreased balance, Difficulty walking, Increased edema  Visit  Diagnosis: Acute pain of left knee  Stiffness of left knee, not elsewhere classified  Localized edema  Muscle weakness (generalized)     Problem List Patient Active Problem List   Diagnosis Date Noted  . Generalized anxiety disorder 11/09/2017  . Chronic abdominal pain 10/16/2017   Becky Sax, LPTA; CBIS [phone] Juel Burrow 05/04/2019, 7:15 PM  [place] [facility] [address] [place], [place], [address] Phone: [phone]   Fax:  [phone]  Name: ALLICIA CULLEY MRN: [id number] Date of Birth: Mar 30, 2001

## 2019-05-05 ENCOUNTER — Telehealth: Payer: Self-pay | Admitting: Family Medicine

## 2019-05-05 ENCOUNTER — Encounter: Payer: Self-pay | Admitting: Family Medicine

## 2019-05-05 NOTE — Telephone Encounter (Signed)
Yes when available for her category classificRION

## 2019-05-05 NOTE — Telephone Encounter (Signed)
Mom calling to see if pt should get the moderna vaccine.

## 2019-05-05 NOTE — Telephone Encounter (Signed)
Please advise. Thank you

## 2019-05-05 NOTE — Telephone Encounter (Signed)
Pt mom contacted and verbalized understanding.  

## 2019-05-06 ENCOUNTER — Encounter (HOSPITAL_COMMUNITY): Payer: Self-pay | Admitting: Physical Therapy

## 2019-05-06 ENCOUNTER — Ambulatory Visit (HOSPITAL_COMMUNITY): Payer: No Typology Code available for payment source | Admitting: Physical Therapy

## 2019-05-06 ENCOUNTER — Other Ambulatory Visit: Payer: Self-pay

## 2019-05-06 DIAGNOSIS — M25662 Stiffness of left knee, not elsewhere classified: Secondary | ICD-10-CM

## 2019-05-06 DIAGNOSIS — M6281 Muscle weakness (generalized): Secondary | ICD-10-CM

## 2019-05-06 DIAGNOSIS — M25562 Pain in left knee: Secondary | ICD-10-CM | POA: Diagnosis not present

## 2019-05-06 DIAGNOSIS — R2689 Other abnormalities of gait and mobility: Secondary | ICD-10-CM

## 2019-05-06 DIAGNOSIS — R600 Localized edema: Secondary | ICD-10-CM

## 2019-05-06 NOTE — Therapy (Signed)
[place], [place], [address] Phone: [phone]   Fax:  [phone]  Physical Therapy Treatment  Patient Details  Name: Renee Savage MRN: [id number] Date of Birth: 05/01/2000 Referring Provider (PT): Victorino December, MD   Encounter Date: 05/06/2019  PT End of Session - 05/06/19 1006    Visit Number  13    Number of Visits  20    Date for PT Re-Evaluation  06/02/19    Authorization Type  Generic commercial (90 visit limit combined PT/OT/ST)    Authorization Time Period  03/24/19 - 05/05/19. Progress note on visit 10. ADDITIONAL 4 weeks added ONTO original CERT --> NEW CERT DATES 70/26/37 to 06/02/19    Authorization - Visit Number  3    Authorization - Number of Visits  10    PT Start Time  [phone]    PT Stop Time  1028   3 minutes on bike unbilled   PT Time Calculation (min)  42 min    Equipment Utilized During Treatment  Left knee immobilizer    Activity Tolerance  Patient tolerated treatment well    Behavior During Therapy  WFL for tasks assessed/performed       Past Medical History:  Diagnosis Date  . Allergy    Chronic  . Angio-edema   . Anxiety   . Asthma   . Beta thalassemia ([place])   . Eczema   . IBS (irritable bowel syndrome)   . Reflux   . Urticaria     Past Surgical History:  Procedure Laterality Date  . CYST REMOVAL HAND    . WISDOM TOOTH EXTRACTION  2016    There were no vitals filed for this visit.  Subjective Assessment - 05/06/19 0954    Subjective  Patient reported accidentally rolling over right 5th toe when trying to balance on one leg.    Pertinent History  History of 5 years of dislocation prior to surgery    Patient Stated Goals  To walk better and be able to bend knee    Currently in Pain?  Yes    Pain Score  2     Pain Location  Toe (Comment which one)    Pain Orientation  Right    Pain Descriptors / Indicators  Aching    Pain Type  Acute pain         [facility] PT  Assessment - 05/06/19 0001      Observation/Other Assessments   Observations  Right big toe slightly red, no sign of abrasion or bruising                   [facility] Adult PT Treatment/Exercise - 05/06/19 0001      Knee/Hip Exercises: Aerobic   Stationary Bike  3 mins seat 5 lvl 0 for ROM initially rocking, then backwards revolutions, and then forward revolutions      Knee/Hip Exercises: Standing   Heel Raises  15 reps    Heel Raises Limitations  Slow lowering 3''     Forward Lunges  Right;Left;1 set;10 reps    Forward Lunges Limitations  Limited ROM and cues to reduce anterior translation of tibia    Lateral Step Up  Step Height: 4";Hand Hold: 0;10 reps;Both    Forward Step Up  Left;Hand Hold: 0;Step Height: 6";1 set;10 reps    Other Standing Knee Exercises  mini squats 3x10, cues to perform hip/knee motion together.  PT Short Term Goals - 04/27/19 0954      PT SHORT TERM GOAL #1   Title  Patient will report understanding and regular compliance with HEP to improve ROM, strength and overall mobility.    Baseline  1/20 - doing HEP daily either 1x or 2x/day    Time  3    Period  Weeks    Status  Achieved    Target Date  04/14/19      PT SHORT TERM GOAL #2   Title  Within protocol, patient will demonstrate left knee extension/flexion active range of motion of at least 0-90 degrees to assist with more normalized gait pattern and stair ambulation.    Baseline  1/20 0-106    Time  3    Period  Weeks    Status  Achieved    Target Date  04/14/19        PT Long Term Goals - 04/27/19 0923      PT LONG TERM GOAL #1   Title  Patient will demonstrate ability to ambulate at a gait velocity of at least 1.0 m/s with LRAD in order to improve safety with community ambulation.    Baseline  1/20: 0.655 m/s    Time  6    Period  Weeks    Status  On-going      PT LONG TERM GOAL #2   Title  Within protocol, patient will demonstrate left knee  extension/flexion active range of motion of at least 0-125 degrees to assist with more normalized gait pattern and stair ambulation.    Baseline  1/20 0-106    Time  6    Period  Weeks    Status  On-going      PT LONG TERM GOAL #3   Title  Patient will demonstrate left lower extremity strength within functional limits in order to return to daily activities safely.    Baseline  Not formally assessed on this date    Time  6    Period  Weeks    Status  On-going      PT LONG TERM GOAL #4   Title  Patient will have circumferential measurement of left knee within 0.5-1 cm difference between the right knee indicating decreased edema for improved ease of mobility.    Time  6    Period  Weeks    Status  On-going      PT LONG TERM GOAL #5   Title  Patient will report ability to ambulate for at least 20 minutes without pain greater than 3 out of 10 in order to perform community activities such as grocery shopping with improved ease.    Time  6    Period  Weeks    Status  On-going            Plan - 05/06/19 0959    Clinical Impression Statement  Patient at 7 weeks post-op. Began stationary bike to improve ROM. Patient was eventually able to perform Anterior revolutions. Added lunges this session to patient's tolerance as well. Observed patient's right big toe with slight redness and educated on use ice for 20 minutes on and 20 minutes off for pain reduction. Patient performed all exercises at a very slow pace.    Personal Factors and Comorbidities  Other   GAD   Examination-Activity Limitations  Bathing;Stand;Bed Mobility;Locomotion Level;Toileting;Bend;Transfers;Sit;Squat;Dressing;Hygiene/Grooming;Stairs    Examination-Participation Restrictions  Cleaning;Meal Prep;Community Activity;Driving;Shop;Laundry    Stability/Clinical Decision Making  Stable/Uncomplicated    Rehab  Potential  Fair    PT Frequency  2x / week    PT Duration  4 weeks    PT Treatment/Interventions  ADLs/Self Care  Home Management;Cryotherapy;Electrical Stimulation;Moist Heat;DME Instruction;Gait training;Stair training;Functional mobility training;Therapeutic activities;Therapeutic exercise;Balance training;Neuromuscular re-education;Patient/family education;Manual techniques;Passive range of motion;Dry needling;Energy conservation;Taping;Orthotic Fit/Training    PT Next Visit Plan  Continue per protocol for week 7.  (see below).  May begin bike as ROM goal acheived.  Per pt./MD stated can begin to wean out of brace as appropriate.    PT Home Exercise Plan  Continue exercises provided at hospital, ICE/elevate and ankle pumps  12/21:  QS, ham set, SL hip Add 04/13/19 Heel raises 3x10       Patient will benefit from skilled therapeutic intervention in order to improve the following deficits and impairments:  Abnormal gait, Impaired sensation, Improper body mechanics, Pain, Decreased mobility, Decreased activity tolerance, Decreased endurance, Decreased range of motion, Decreased strength, Hypomobility, Decreased balance, Difficulty walking, Increased edema  Visit Diagnosis: Acute pain of left knee  Stiffness of left knee, not elsewhere classified  Localized edema  Muscle weakness (generalized)  Other abnormalities of gait and mobility     Problem List Patient Active Problem List   Diagnosis Date Noted  . Generalized anxiety disorder 11/09/2017  . Chronic abdominal pain 10/16/2017   Verne Carrow PT, DPT 10:34 AM, 05/06/19 [phone]  [facility] [address] [place], [place], [address] Phone: [phone]   Fax:  [phone]  Name: TAEGAN HAIDER MRN: [id number] Date of Birth: 01/27/2001

## 2019-05-09 ENCOUNTER — Encounter (HOSPITAL_COMMUNITY): Payer: PRIVATE HEALTH INSURANCE | Admitting: Physical Therapy

## 2019-05-11 ENCOUNTER — Other Ambulatory Visit: Payer: Self-pay

## 2019-05-11 ENCOUNTER — Ambulatory Visit (HOSPITAL_COMMUNITY): Payer: No Typology Code available for payment source | Attending: Orthopedic Surgery | Admitting: Physical Therapy

## 2019-05-11 ENCOUNTER — Encounter (HOSPITAL_COMMUNITY): Payer: Self-pay | Admitting: Physical Therapy

## 2019-05-11 DIAGNOSIS — M25662 Stiffness of left knee, not elsewhere classified: Secondary | ICD-10-CM | POA: Diagnosis present

## 2019-05-11 DIAGNOSIS — R600 Localized edema: Secondary | ICD-10-CM | POA: Insufficient documentation

## 2019-05-11 DIAGNOSIS — M6281 Muscle weakness (generalized): Secondary | ICD-10-CM | POA: Insufficient documentation

## 2019-05-11 DIAGNOSIS — M25562 Pain in left knee: Secondary | ICD-10-CM | POA: Diagnosis present

## 2019-05-11 DIAGNOSIS — R2689 Other abnormalities of gait and mobility: Secondary | ICD-10-CM | POA: Insufficient documentation

## 2019-05-11 NOTE — Therapy (Signed)
[facility] [address] [place], [place], [address] Phone: [phone]   Fax:  [phone]  Physical Therapy Treatment  Patient Details  Name: Renee Savage MRN: [id number] Date of Birth: 2000/04/21 Referring Provider (PT): Duwayne Heck, MD   Encounter Date: 05/11/2019  PT End of Session - 05/11/19 0939    Visit Number  14    Number of Visits  20    Date for PT Re-Evaluation  06/02/19    Authorization Type  Generic commercial (90 visit limit combined PT/OT/ST)    Authorization Time Period  03/24/19 - 05/05/19. Progress note on visit 10. ADDITIONAL 4 weeks added ONTO original CERT --> NEW CERT DATES 60/63/01 to 06/02/19    Authorization - Visit Number  4    Authorization - Number of Visits  10    PT Start Time  0930    PT Stop Time  1015    PT Time Calculation (min)  45 min    Equipment Utilized During Treatment  Left knee immobilizer    Activity Tolerance  Patient tolerated treatment well    Behavior During Therapy  WFL for tasks assessed/performed       Past Medical History:  Diagnosis Date  . Allergy    Chronic  . Angio-edema   . Anxiety   . Asthma   . Beta thalassemia ([facility])   . Eczema   . IBS (irritable bowel syndrome)   . Reflux   . Urticaria     Past Surgical History:  Procedure Laterality Date  . CYST REMOVAL HAND    . WISDOM TOOTH EXTRACTION  2016    There were no vitals filed for this visit.  Subjective Assessment - 05/11/19 0933    Subjective  States she has been having some mild pain at night as she is no longer wearing her brace at night and she tends to stretch out at night.    Pertinent History  History of 5 years of dislocation prior to surgery    Patient Stated Goals  To walk better and be able to bend knee    Currently in Pain?  No/denies         [facility] PT Assessment - 05/11/19 0001      Assessment   Medical Diagnosis  S/P Lt arthroscopic medial patellofemoral ligament reconstruction with  hamstring allograft    Referring Provider (PT)  Duwayne Heck, MD    Onset Date/Surgical Date  03/16/19                   [facility] Adult PT Treatment/Exercise - 05/11/19 0001      Knee/Hip Exercises: Aerobic   Stationary Bike  3 minutes - backward pedaling then forwards - not billed for      Knee/Hip Exercises: Standing   Gait Training  tandem walking on line 15 feet, x8, lateral steppingin mini squat on blue line x5 bilateral     Other Standing Knee Exercises  chair pose holds - 4x30" holds.    Other Standing Knee Exercises  rocker  board balance x4 30" holds, lateral taps - 2x4  pain on second step stopped      Knee/Hip Exercises: Seated   Sit to Sand  10 reps;without UE support   chair + blue foam and visual cues x3 sets              PT Short Term Goals - 04/27/19 0954      PT SHORT TERM GOAL #  1   Title  Patient will report understanding and regular compliance with HEP to improve ROM, strength and overall mobility.    Baseline  1/20 - doing HEP daily either 1x or 2x/day    Time  3    Period  Weeks    Status  Achieved    Target Date  04/14/19      PT SHORT TERM GOAL #2   Title  Within protocol, patient will demonstrate left knee extension/flexion active range of motion of at least 0-90 degrees to assist with more normalized gait pattern and stair ambulation.    Baseline  1/20 0-106    Time  3    Period  Weeks    Status  Achieved    Target Date  04/14/19        PT Long Term Goals - 04/27/19 0923      PT LONG TERM GOAL #1   Title  Patient will demonstrate ability to ambulate at a gait velocity of at least 1.0 m/s with LRAD in order to improve safety with community ambulation.    Baseline  1/20: 0.655 m/s    Time  6    Period  Weeks    Status  On-going      PT LONG TERM GOAL #2   Title  Within protocol, patient will demonstrate left knee extension/flexion active range of motion of at least 0-125 degrees to assist with more normalized gait pattern and  stair ambulation.    Baseline  1/20 0-106    Time  6    Period  Weeks    Status  On-going      PT LONG TERM GOAL #3   Title  Patient will demonstrate left lower extremity strength within functional limits in order to return to daily activities safely.    Baseline  Not formally assessed on this date    Time  6    Period  Weeks    Status  On-going      PT LONG TERM GOAL #4   Title  Patient will have circumferential measurement of left knee within 0.5-1 cm difference between the right knee indicating decreased edema for improved ease of mobility.    Time  6    Period  Weeks    Status  On-going      PT LONG TERM GOAL #5   Title  Patient will report ability to ambulate for at least 20 minutes without pain greater than 3 out of 10 in order to perform community activities such as grocery shopping with improved ease.    Time  6    Period  Weeks    Status  On-going            Plan - 05/11/19 0934    Clinical Impression Statement  Focused on strengthening and balance exercises out of brace today. Minimal pain noted with sit to stand so seat was elevated and patient tolerated this better. Focused on equal weight distribution between both lower extremities. Tolerated this well with visual cues. added exercises in frontal plane and patient tolerated this moderately well. Would continue to benefit from frontal plane exercises.    Personal Factors and Comorbidities  Other   GAD   Examination-Activity Limitations  Bathing;Stand;Bed Mobility;Locomotion Level;Toileting;Bend;Transfers;Sit;Squat;Dressing;Hygiene/Grooming;Stairs    Examination-Participation Restrictions  Cleaning;Meal Prep;Community Activity;Driving;Shop;Laundry    Stability/Clinical Decision Making  Stable/Uncomplicated    Rehab Potential  Fair    PT Frequency  2x / week    PT Duration  4 weeks    PT Treatment/Interventions  ADLs/Self Care Home Management;Cryotherapy;Electrical Stimulation;Moist Heat;DME Instruction;Gait  training;Stair training;Functional mobility training;Therapeutic activities;Therapeutic exercise;Balance training;Neuromuscular re-education;Patient/family education;Manual techniques;Passive range of motion;Dry needling;Energy conservation;Taping;Orthotic Fit/Training    PT Next Visit Plan  Continue per protocol for week 7.  (see below).  May begin bike as ROM goal acheived.  Per pt./MD stated can begin to wean out of brace as appropriate.    PT Home Exercise Plan  Continue exercises provided at hospital, ICE/elevate and ankle pumps  12/21:  QS, ham set, SL hip Add 04/13/19 Heel raises 3x10       Patient will benefit from skilled therapeutic intervention in order to improve the following deficits and impairments:  Abnormal gait, Impaired sensation, Improper body mechanics, Pain, Decreased mobility, Decreased activity tolerance, Decreased endurance, Decreased range of motion, Decreased strength, Hypomobility, Decreased balance, Difficulty walking, Increased edema  Visit Diagnosis: Acute pain of left knee  Stiffness of left knee, not elsewhere classified  Localized edema  Muscle weakness (generalized)     Problem List Patient Active Problem List   Diagnosis Date Noted  . Generalized anxiety disorder 11/09/2017  . Chronic abdominal pain 10/16/2017   11:12 AM, 05/11/19 Tereasa Coop, DPT Physical Therapy with [facility] [place] [facility]  [phone] office  [facility] [address] [place], [place], [address] Phone: [phone]   Fax:  [phone]  Name: Renee Savage MRN: [id number] Date of Birth: [birth year]

## 2019-05-12 ENCOUNTER — Telehealth: Payer: Self-pay | Admitting: Family Medicine

## 2019-05-12 ENCOUNTER — Encounter: Payer: Self-pay | Admitting: *Deleted

## 2019-05-12 NOTE — Telephone Encounter (Signed)
Pt took amox. 4 tablets yesterday before dental appt. She states she had a reaction to where she was light headed, loopy and dizzy. She is still a little light headed today.

## 2019-05-12 NOTE — Telephone Encounter (Signed)
Yes, definietly can take it. Ask patint how she ended up qualifying fr it as a 19 year old?

## 2019-05-12 NOTE — Telephone Encounter (Signed)
Pt notified she could still get vaccine. pt states her dad works directly with covid pts at a hospital in [place] and due to her medical history ( asthma ) she qualified to get vaccine. Her mom also qualified due to her medical history.

## 2019-05-12 NOTE — Telephone Encounter (Signed)
Pt states she is feeling better than yesterday. Still a little lightheadness. Suppose to take covid vaccine today at her dad's job at 2:15 and wants to know if she should still go

## 2019-05-12 NOTE — Telephone Encounter (Signed)
Mmm k

## 2019-05-13 ENCOUNTER — Other Ambulatory Visit: Payer: Self-pay

## 2019-05-13 ENCOUNTER — Encounter (HOSPITAL_COMMUNITY): Payer: Self-pay | Admitting: Physical Therapy

## 2019-05-13 ENCOUNTER — Ambulatory Visit (HOSPITAL_COMMUNITY): Payer: No Typology Code available for payment source | Admitting: Physical Therapy

## 2019-05-13 DIAGNOSIS — M25562 Pain in left knee: Secondary | ICD-10-CM | POA: Diagnosis not present

## 2019-05-13 DIAGNOSIS — M6281 Muscle weakness (generalized): Secondary | ICD-10-CM

## 2019-05-13 DIAGNOSIS — R600 Localized edema: Secondary | ICD-10-CM

## 2019-05-13 DIAGNOSIS — R2689 Other abnormalities of gait and mobility: Secondary | ICD-10-CM

## 2019-05-13 DIAGNOSIS — M25662 Stiffness of left knee, not elsewhere classified: Secondary | ICD-10-CM

## 2019-05-13 NOTE — Therapy (Signed)
[place], [place], [address] Phone: [phone]   Fax:  [phone]  Physical Therapy Treatment  Patient Details  Name: Renee Savage MRN: [id number] Date of Birth: 02/24/2001 Referring Provider (PT): Victorino December, MD   Encounter Date: 05/13/2019  PT End of Session - 05/13/19 1012    Visit Number  15    Number of Visits  20    Date for PT Re-Evaluation  06/02/19    Authorization Type  Generic commercial (90 visit limit combined PT/OT/ST)    Authorization Time Period  03/24/19 - 05/05/19. Progress note on visit 10. ADDITIONAL 4 weeks added ONTO original CERT --> NEW CERT DATES 14/97/02 to 06/02/19    Authorization - Visit Number  5    Authorization - Number of Visits  10    PT Start Time  [phone]    PT Stop Time  1033   3 minutes unbilled while on bike   PT Time Calculation (min)  45 min    Equipment Utilized During Treatment  Left knee immobilizer    Activity Tolerance  Patient tolerated treatment well    Behavior During Therapy  WFL for tasks assessed/performed       Past Medical History:  Diagnosis Date  . Allergy    Chronic  . Angio-edema   . Anxiety   . Asthma   . Beta thalassemia ([place])   . Eczema   . IBS (irritable bowel syndrome)   . Reflux   . Urticaria     Past Surgical History:  Procedure Laterality Date  . CYST REMOVAL HAND    . WISDOM TOOTH EXTRACTION  2016    There were no vitals filed for this visit.  Subjective Assessment - 05/13/19 0953    Subjective  Patient reported minimal pain in arm due to getting a vaccine, but denied any pain in the knee.    Pertinent History  History of 5 years of dislocation prior to surgery    Patient Stated Goals  To walk better and be able to bend knee    Currently in Pain?  No/denies                       [facility] Adult PT Treatment/Exercise - 05/13/19 0001      Knee/Hip Exercises: Aerobic   Stationary Bike  3 minutes - backward  pedaling then forwards - not billed for      Knee/Hip Exercises: Standing   Gait Training  tandem walking on line 15 feet, x8, lateral steppingin mini squat on blue line x5 bilateral     Other Standing Knee Exercises  chair pose holds - 4x30" holds.    Other Standing Knee Exercises  SLS with vectors 10'' fwd/side/back x 3 each LE      Knee/Hip Exercises: Seated   Sit to Sand  10 reps;without UE support   3 sets, able to do without blue foam            PT Education - 05/13/19 1058    Education Details  Updated HEP and educated on performing sit to stands at home.    Person(s) Educated  Patient    Methods  Explanation;Handout    Comprehension  Verbalized understanding       PT Short Term Goals - 04/27/19 0954      PT SHORT TERM GOAL #1   Title  Patient will report understanding and regular compliance with HEP to  improve ROM, strength and overall mobility.    Baseline  1/20 - doing HEP daily either 1x or 2x/day    Time  3    Period  Weeks    Status  Achieved    Target Date  04/14/19      PT SHORT TERM GOAL #2   Title  Within protocol, patient will demonstrate left knee extension/flexion active range of motion of at least 0-90 degrees to assist with more normalized gait pattern and stair ambulation.    Baseline  1/20 0-106    Time  3    Period  Weeks    Status  Achieved    Target Date  04/14/19        PT Long Term Goals - 04/27/19 0923      PT LONG TERM GOAL #1   Title  Patient will demonstrate ability to ambulate at a gait velocity of at least 1.0 m/s with LRAD in order to improve safety with community ambulation.    Baseline  1/20: 0.655 m/s    Time  6    Period  Weeks    Status  On-going      PT LONG TERM GOAL #2   Title  Within protocol, patient will demonstrate left knee extension/flexion active range of motion of at least 0-125 degrees to assist with more normalized gait pattern and stair ambulation.    Baseline  1/20 0-106    Time  6    Period  Weeks     Status  On-going      PT LONG TERM GOAL #3   Title  Patient will demonstrate left lower extremity strength within functional limits in order to return to daily activities safely.    Baseline  Not formally assessed on this date    Time  6    Period  Weeks    Status  On-going      PT LONG TERM GOAL #4   Title  Patient will have circumferential measurement of left knee within 0.5-1 cm difference between the right knee indicating decreased edema for improved ease of mobility.    Time  6    Period  Weeks    Status  On-going      PT LONG TERM GOAL #5   Title  Patient will report ability to ambulate for at least 20 minutes without pain greater than 3 out of 10 in order to perform community activities such as grocery shopping with improved ease.    Time  6    Period  Weeks    Status  On-going            Plan - 05/13/19 1059    Clinical Impression Statement  Continued to focus on strengthening outside of the brace this session. Added SLS with vectors to work on stability and balance. With the chair pose cued patient to be aware of her feet and focusing on keeping the lateral border of her foot down to prevent collapsing in excess pronation. Patient was able to perform sit to stands this session without the blue foam underneath her. Added sit to stands to patient's HEP.    Personal Factors and Comorbidities  Other   GAD   Examination-Activity Limitations  Bathing;Stand;Bed Mobility;Locomotion Level;Toileting;Bend;Transfers;Sit;Squat;Dressing;Hygiene/Grooming;Stairs    Examination-Participation Restrictions  Cleaning;Meal Prep;Community Activity;Driving;Shop;Laundry    Stability/Clinical Decision Making  Stable/Uncomplicated    Rehab Potential  Fair    PT Frequency  2x / week    PT Duration  4  weeks    PT Treatment/Interventions  ADLs/Self Care Home Management;Cryotherapy;Electrical Stimulation;Moist Heat;DME Instruction;Gait training;Stair training;Functional mobility  training;Therapeutic activities;Therapeutic exercise;Balance training;Neuromuscular re-education;Patient/family education;Manual techniques;Passive range of motion;Dry needling;Energy conservation;Taping;Orthotic Fit/Training    PT Next Visit Plan  Per pt./MD stated can begin to wean out of brace as appropriate.    PT Home Exercise Plan  Continue exercises provided at hospital, ICE/elevate and ankle pumps  12/21:  QS, ham set, SL hip Add 04/13/19 Heel raises 3x10; 05/13/19: STS 3x10 daily       Patient will benefit from skilled therapeutic intervention in order to improve the following deficits and impairments:  Abnormal gait, Impaired sensation, Improper body mechanics, Pain, Decreased mobility, Decreased activity tolerance, Decreased endurance, Decreased range of motion, Decreased strength, Hypomobility, Decreased balance, Difficulty walking, Increased edema  Visit Diagnosis: Acute pain of left knee  Stiffness of left knee, not elsewhere classified  Localized edema  Muscle weakness (generalized)  Other abnormalities of gait and mobility     Problem List Patient Active Problem List   Diagnosis Date Noted  . Generalized anxiety disorder 11/09/2017  . Chronic abdominal pain 10/16/2017   Verne Carrow PT, DPT 11:04 AM, 05/13/19 [phone]  [facility] [address] [place], [place], [address] Phone: [phone]   Fax:  [phone]  Name: Renee Savage MRN: [id number] Date of Birth: [birth year]

## 2019-05-13 NOTE — Patient Instructions (Signed)
SIT TO STAND: Feet Narrow    Place feet close together. Lean chest forward. Raise hips and straighten knees to stand. _10__ reps per set, __3_ sets per day, _7__ days per week Hold onto a support as needed.  Copyright  V[employer]. All rights reserved.

## 2019-05-19 ENCOUNTER — Encounter (HOSPITAL_COMMUNITY): Payer: Self-pay | Admitting: Physical Therapy

## 2019-05-19 ENCOUNTER — Other Ambulatory Visit: Payer: Self-pay

## 2019-05-19 ENCOUNTER — Ambulatory Visit (HOSPITAL_COMMUNITY): Payer: No Typology Code available for payment source | Admitting: Physical Therapy

## 2019-05-19 DIAGNOSIS — M25662 Stiffness of left knee, not elsewhere classified: Secondary | ICD-10-CM

## 2019-05-19 DIAGNOSIS — R2689 Other abnormalities of gait and mobility: Secondary | ICD-10-CM

## 2019-05-19 DIAGNOSIS — M6281 Muscle weakness (generalized): Secondary | ICD-10-CM

## 2019-05-19 DIAGNOSIS — R600 Localized edema: Secondary | ICD-10-CM

## 2019-05-19 DIAGNOSIS — M25562 Pain in left knee: Secondary | ICD-10-CM | POA: Diagnosis not present

## 2019-05-19 NOTE — Therapy (Signed)
[facility] [address] [place], [place], [address] Phone: [phone]   Fax:  [phone]  Physical Therapy Treatment  Patient Details  Name: Renee Savage MRN: [id number] Date of Birth: [birth year] Referring Provider (PT): Duwayne Heck, MD   Encounter Date: 05/19/2019  PT End of Session - 05/19/19 0942    Visit Number  16    Number of Visits  20    Date for PT Re-Evaluation  06/02/19    Authorization Type  Generic commercial (90 visit limit combined PT/OT/ST)    Authorization Time Period  03/24/19 - 05/05/19. Progress note on visit 10. ADDITIONAL 4 weeks added ONTO original CERT --> NEW CERT DATES 34/28/76 to 06/02/19    Authorization - Visit Number  6    Authorization - Number of Visits  10    PT Start Time  0905    PT Stop Time  0952    PT Time Calculation (min)  47 min    Equipment Utilized During Treatment  Left knee immobilizer    Activity Tolerance  Patient tolerated treatment well    Behavior During Therapy  [facility] for tasks assessed/performed       Past Medical History:  Diagnosis Date  . Allergy    Chronic  . Angio-edema   . Anxiety   . Asthma   . Beta thalassemia (HCC)   . Eczema   . IBS (irritable bowel syndrome)   . Reflux   . Urticaria     Past Surgical History:  Procedure Laterality Date  . CYST REMOVAL HAND    . WISDOM TOOTH EXTRACTION  2016    There were no vitals filed for this visit.  Subjective Assessment - 05/19/19 0909    Subjective  Patient reported that she crouched down on 05/16/19 and that this increased her knee pain.    Pertinent History  History of 5 years of dislocation prior to surgery    Patient Stated Goals  To walk better and be able to bend knee    Currently in Pain?  Yes    Pain Score  3     Pain Location  Knee    Pain Orientation  Left    Pain Descriptors / Indicators  Sharp    Pain Type  Acute pain                       [facility] Adult PT Treatment/Exercise -  05/19/19 0001      Knee/Hip Exercises: Aerobic   Stationary Bike  3 minutes - backward pedaling then forwards - not billed for. No resistance.       Knee/Hip Exercises: Standing   Heel Raises  10 reps;2 sets    Heel Raises Limitations  Squat to heel raise     Knee Flexion  Left;1 set;15 reps;Strengthening    Gait Training  tandem walking on line 15 feet, x8, lateral steppingin mini squat on blue line x5 bilateral     Other Standing Knee Exercises  chair pose holds - 4x30" holds.   VCs to improve alignment   Other Standing Knee Exercises  SLS with vectors on foam 10'' fwd/side/back x 3 LT LE. Therapist providing resistance with RTB to LT, RT, and anteriorly.                PT Short Term Goals - 04/27/19 0954      PT SHORT TERM GOAL #1   Title  Patient will report  understanding and regular compliance with HEP to improve ROM, strength and overall mobility.    Baseline  1/20 - doing HEP daily either 1x or 2x/day    Time  3    Period  Weeks    Status  Achieved    Target Date  04/14/19      PT SHORT TERM GOAL #2   Title  Within protocol, patient will demonstrate left knee extension/flexion active range of motion of at least 0-90 degrees to assist with more normalized gait pattern and stair ambulation.    Baseline  1/20 0-106    Time  3    Period  Weeks    Status  Achieved    Target Date  04/14/19        PT Long Term Goals - 04/27/19 0923      PT LONG TERM GOAL #1   Title  Patient will demonstrate ability to ambulate at a gait velocity of at least 1.0 m/s with LRAD in order to improve safety with community ambulation.    Baseline  1/20: 0.655 m/s    Time  6    Period  Weeks    Status  On-going      PT LONG TERM GOAL #2   Title  Within protocol, patient will demonstrate left knee extension/flexion active range of motion of at least 0-125 degrees to assist with more normalized gait pattern and stair ambulation.    Baseline  1/20 0-106    Time  6    Period  Weeks     Status  On-going      PT LONG TERM GOAL #3   Title  Patient will demonstrate left lower extremity strength within functional limits in order to return to daily activities safely.    Baseline  Not formally assessed on this date    Time  6    Period  Weeks    Status  On-going      PT LONG TERM GOAL #4   Title  Patient will have circumferential measurement of left knee within 0.5-1 cm difference between the right knee indicating decreased edema for improved ease of mobility.    Time  6    Period  Weeks    Status  On-going      PT LONG TERM GOAL #5   Title  Patient will report ability to ambulate for at least 20 minutes without pain greater than 3 out of 10 in order to perform community activities such as grocery shopping with improved ease.    Time  6    Period  Weeks    Status  On-going            Plan - 05/19/19 1009    Clinical Impression Statement  Patient with a little increased pain this session due to having squatted down while at home, so all exercises were performed within patient's tolerance. This session added single leg stance on foam with therapist resisting in several planes to increase muscle stabilization. Patient demonstrated some unsteadiness with this, but no increased pain. Also added squats to heel raises this session. Patient required some cueing to improve form with squat. Encouraged patient to continue slowly weaning out of the brace outside of therapy as well.    Personal Factors and Comorbidities  Other   GAD   Examination-Activity Limitations  Bathing;Stand;Bed Mobility;Locomotion Level;Toileting;Bend;Transfers;Sit;Squat;Dressing;Hygiene/Grooming;Stairs    Examination-Participation Restrictions  Cleaning;Meal Prep;Community Activity;Driving;Shop;Laundry    Stability/Clinical Decision Making  Stable/Uncomplicated    Rehab Potential  Fair    PT Frequency  2x / week    PT Duration  4 weeks    PT Treatment/Interventions  ADLs/Self Care Home  Management;Cryotherapy;Electrical Stimulation;Moist Heat;DME Instruction;Gait training;Stair training;Functional mobility training;Therapeutic activities;Therapeutic exercise;Balance training;Neuromuscular re-education;Patient/family education;Manual techniques;Passive range of motion;Dry needling;Energy conservation;Taping;Orthotic Fit/Training    PT Next Visit Plan  Per pt./MD stated can begin to wean out of brace as appropriate. Examine gait outside of brace next session.    PT Home Exercise Plan  Continue exercises provided at hospital, ICE/elevate and ankle pumps  12/21:  QS, ham set, SL hip Add 04/13/19 Heel raises 3x10; 05/13/19: STS 3x10 daily       Patient will benefit from skilled therapeutic intervention in order to improve the following deficits and impairments:  Abnormal gait, Impaired sensation, Improper body mechanics, Pain, Decreased mobility, Decreased activity tolerance, Decreased endurance, Decreased range of motion, Decreased strength, Hypomobility, Decreased balance, Difficulty walking, Increased edema  Visit Diagnosis: Acute pain of left knee  Stiffness of left knee, not elsewhere classified  Localized edema  Muscle weakness (generalized)  Other abnormalities of gait and mobility     Problem List Patient Active Problem List   Diagnosis Date Noted  . Generalized anxiety disorder 11/09/2017  . Chronic abdominal pain 10/16/2017   Verne Carrow PT, DPT 10:11 AM, 05/19/19 [phone]  [facility] [address] [place], [place], [address] Phone: [phone]   Fax:  [phone]  Name: Renee Savage MRN: [id number] Date of Birth: 2001-02-09

## 2019-05-20 ENCOUNTER — Encounter (HOSPITAL_COMMUNITY): Payer: Self-pay | Admitting: Physical Therapy

## 2019-05-20 ENCOUNTER — Ambulatory Visit (HOSPITAL_COMMUNITY): Payer: No Typology Code available for payment source | Admitting: Physical Therapy

## 2019-05-20 DIAGNOSIS — M6281 Muscle weakness (generalized): Secondary | ICD-10-CM

## 2019-05-20 DIAGNOSIS — M25562 Pain in left knee: Secondary | ICD-10-CM | POA: Diagnosis not present

## 2019-05-20 DIAGNOSIS — R2689 Other abnormalities of gait and mobility: Secondary | ICD-10-CM

## 2019-05-20 DIAGNOSIS — R600 Localized edema: Secondary | ICD-10-CM

## 2019-05-20 DIAGNOSIS — M25662 Stiffness of left knee, not elsewhere classified: Secondary | ICD-10-CM

## 2019-05-20 NOTE — Therapy (Signed)
[facility] [address] [place], [place], [address] Phone: [phone]   Fax:  [phone]  Physical Therapy Treatment  Patient Details  Name: Renee Savage MRN: [id number] Date of Birth: October 27, 2000 Referring Provider (PT): Duwayne Heck, MD   Encounter Date: 05/20/2019  PT End of Session - 05/20/19 0956    Visit Number  17    Number of Visits  20    Date for PT Re-Evaluation  06/02/19    Authorization Type  Generic commercial (90 visit limit combined PT/OT/ST)    Authorization Time Period  03/24/19 - 05/05/19. Progress note on visit 10. ADDITIONAL 4 weeks added ONTO original CERT --> NEW CERT DATES 15/05/69 to 06/02/19    Authorization - Visit Number  7    Authorization - Number of Visits  10    PT Start Time  [phone]   Patient arrived late   PT Stop Time  1037   time on bike unbilled   PT Time Calculation (min)  44 min    Equipment Utilized During Treatment  Left knee immobilizer    Activity Tolerance  Patient tolerated treatment well    Behavior During Therapy  [facility] for tasks assessed/performed       Past Medical History:  Diagnosis Date  . Allergy    Chronic  . Angio-edema   . Anxiety   . Asthma   . Beta thalassemia (HCC)   . Eczema   . IBS (irritable bowel syndrome)   . Reflux   . Urticaria     Past Surgical History:  Procedure Laterality Date  . CYST REMOVAL HAND    . WISDOM TOOTH EXTRACTION  2016    There were no vitals filed for this visit.  Subjective Assessment - 05/20/19 1017    Subjective  Patient reported that her knee is feeling bettrr today.    Pertinent History  History of 5 years of dislocation prior to surgery    Patient Stated Goals  To walk better and be able to bend knee    Currently in Pain?  Yes    Pain Score  1     Pain Location  Knee    Pain Orientation  Left    Pain Descriptors / Indicators  Clance Boll PT Assessment - 05/20/19 0001      Assessment   Medical Diagnosis  S/P  Lt arthroscopic medial patellofemoral ligament reconstruction with hamstring allograft    Referring Provider (PT)  Duwayne Heck, MD    Next MD Visit  06/16/19                   [facility] Adult PT Treatment/Exercise - 05/20/19 0001      Knee/Hip Exercises: Aerobic   Stationary Bike  3 minutes - backward pedaling then forwards - not billed for. No resistance.       Knee/Hip Exercises: Machines for Strengthening   Total Gym Leg Press  2x10 with 20# DBL leg      Knee/Hip Exercises: Standing   Heel Raises  10 reps;2 sets    Heel Raises Limitations  Squat to heel raise     Gait Training  Ambulation 226' without brace cues to improve heel to toe gait.    Other Standing Knee Exercises  Backwards resisted walking 20# x10. SLS with vectors on foam 10'' fwd/side/back x 3 LT LE. Therapist providing resistance with RTB to LT, RT, and anteriorly.  Knee/Hip Exercises: Supine   Other Supine Knee/Hip Exercises  Abdominal set into small green physioball x15 holding for 5''              PT Education - 05/20/19 1018    Education Details  Discussed purpose and technique of interventions throughout session.    Person(s) Educated  Patient    Methods  Explanation    Comprehension  Verbalized understanding       PT Short Term Goals - 04/27/19 0954      PT SHORT TERM GOAL #1   Title  Patient will report understanding and regular compliance with HEP to improve ROM, strength and overall mobility.    Baseline  1/20 - doing HEP daily either 1x or 2x/day    Time  3    Period  Weeks    Status  Achieved    Target Date  04/14/19      PT SHORT TERM GOAL #2   Title  Within protocol, patient will demonstrate left knee extension/flexion active range of motion of at least 0-90 degrees to assist with more normalized gait pattern and stair ambulation.    Baseline  1/20 0-106    Time  3    Period  Weeks    Status  Achieved    Target Date  04/14/19        PT Long Term Goals - 04/27/19  0923      PT LONG TERM GOAL #1   Title  Patient will demonstrate ability to ambulate at a gait velocity of at least 1.0 m/s with LRAD in order to improve safety with community ambulation.    Baseline  1/20: 0.655 m/s    Time  6    Period  Weeks    Status  On-going      PT LONG TERM GOAL #2   Title  Within protocol, patient will demonstrate left knee extension/flexion active range of motion of at least 0-125 degrees to assist with more normalized gait pattern and stair ambulation.    Baseline  1/20 0-106    Time  6    Period  Weeks    Status  On-going      PT LONG TERM GOAL #3   Title  Patient will demonstrate left lower extremity strength within functional limits in order to return to daily activities safely.    Baseline  Not formally assessed on this date    Time  6    Period  Weeks    Status  On-going      PT LONG TERM GOAL #4   Title  Patient will have circumferential measurement of left knee within 0.5-1 cm difference between the right knee indicating decreased edema for improved ease of mobility.    Time  6    Period  Weeks    Status  On-going      PT LONG TERM GOAL #5   Title  Patient will report ability to ambulate for at least 20 minutes without pain greater than 3 out of 10 in order to perform community activities such as grocery shopping with improved ease.    Time  6    Period  Weeks    Status  On-going            Plan - 05/20/19 1100    Clinical Impression Statement  This session continued with focus on increasing strength. Added the leg press this session with verbal cues to improve eccentric control. Patient performed exercises  very slowly. Resumed resisted posterior walking this session and increased resistance. Also added supine isometric abdominal set using a physioball this session to work on abdominal strength. Also added this to the patient's HEP.    Personal Factors and Comorbidities  Other   GAD   Examination-Activity Limitations  Bathing;Stand;Bed  Mobility;Locomotion Level;Toileting;Bend;Transfers;Sit;Squat;Dressing;Hygiene/Grooming;Stairs    Examination-Participation Restrictions  Cleaning;Meal Prep;Community Activity;Driving;Shop;Laundry    Stability/Clinical Decision Making  Stable/Uncomplicated    Rehab Potential  Fair    PT Frequency  2x / week    PT Duration  4 weeks    PT Treatment/Interventions  ADLs/Self Care Home Management;Cryotherapy;Electrical Stimulation;Moist Heat;DME Instruction;Gait training;Stair training;Functional mobility training;Therapeutic activities;Therapeutic exercise;Balance training;Neuromuscular re-education;Patient/family education;Manual techniques;Passive range of motion;Dry needling;Energy conservation;Taping;Orthotic Fit/Training    PT Next Visit Plan  Per pt./MD stated can begin to wean out of brace as appropriate. Trial ambulation on foam next session, look at stair negotiation    PT Home Exercise Plan  Continue exercises provided at hospital, ICE/elevate and ankle pumps  12/21:  QS, ham set, SL hip Add 04/13/19 Heel raises 3x10; 05/13/19: STS 3x10 daily; 05/20/19: Isometric abdominal exercise into physioball 5'' holds x15 2-3 sets daily       Patient will benefit from skilled therapeutic intervention in order to improve the following deficits and impairments:  Abnormal gait, Impaired sensation, Improper body mechanics, Pain, Decreased mobility, Decreased activity tolerance, Decreased endurance, Decreased range of motion, Decreased strength, Hypomobility, Decreased balance, Difficulty walking, Increased edema  Visit Diagnosis: Acute pain of left knee  Stiffness of left knee, not elsewhere classified  Localized edema  Muscle weakness (generalized)  Other abnormalities of gait and mobility     Problem List Patient Active Problem List   Diagnosis Date Noted  . Generalized anxiety disorder 11/09/2017  . Chronic abdominal pain 10/16/2017   Clarene Critchley PT, DPT 11:05 AM, 05/20/19 [place], [place], [address] Phone: [phone]   Fax:  [phone]  Name: Renee Savage MRN: [id number] Date of Birth: 2000-06-19

## 2019-05-23 ENCOUNTER — Telehealth (HOSPITAL_COMMUNITY): Payer: Self-pay | Admitting: Physical Therapy

## 2019-05-23 ENCOUNTER — Ambulatory Visit (HOSPITAL_COMMUNITY): Payer: No Typology Code available for payment source | Admitting: Physical Therapy

## 2019-05-23 NOTE — Telephone Encounter (Signed)
pt called to cancel due to power outage at her house

## 2019-05-26 ENCOUNTER — Ambulatory Visit (HOSPITAL_COMMUNITY): Payer: No Typology Code available for payment source | Admitting: Physical Therapy

## 2019-06-01 ENCOUNTER — Ambulatory Visit (HOSPITAL_COMMUNITY): Payer: No Typology Code available for payment source | Admitting: Physical Therapy

## 2019-06-01 ENCOUNTER — Other Ambulatory Visit: Payer: Self-pay

## 2019-06-01 ENCOUNTER — Encounter (HOSPITAL_COMMUNITY): Payer: Self-pay | Admitting: Physical Therapy

## 2019-06-01 DIAGNOSIS — R2689 Other abnormalities of gait and mobility: Secondary | ICD-10-CM

## 2019-06-01 DIAGNOSIS — R600 Localized edema: Secondary | ICD-10-CM

## 2019-06-01 DIAGNOSIS — M6281 Muscle weakness (generalized): Secondary | ICD-10-CM

## 2019-06-01 DIAGNOSIS — M25662 Stiffness of left knee, not elsewhere classified: Secondary | ICD-10-CM

## 2019-06-01 DIAGNOSIS — M25562 Pain in left knee: Secondary | ICD-10-CM | POA: Diagnosis not present

## 2019-06-01 NOTE — Therapy (Signed)
[place], [place], [address] Phone: [phone]   Fax:  [phone]  Physical Therapy Treatment  Patient Details  Name: Renee Savage MRN: [id number] Date of Birth: 2001/02/06 Referring Provider (PT): Victorino December, MD   Encounter Date: 06/01/2019  PT End of Session - 06/01/19 0821    Visit Number  18    Number of Visits  20    Date for PT Re-Evaluation  06/02/19    Authorization Type  Generic commercial (90 visit limit combined PT/OT/ST)    Authorization Time Period  03/24/19 - 05/05/19. Progress note on visit 10. ADDITIONAL 4 weeks added ONTO original CERT --> NEW CERT DATES 74/25/95 to 06/02/19    Authorization - Visit Number  8    Authorization - Number of Visits  10    PT Start Time  0815    PT Stop Time  0905    PT Time Calculation (min)  50 min    Equipment Utilized During Treatment  Left knee immobilizer    Activity Tolerance  Patient tolerated treatment well    Behavior During Therapy  [facility] for tasks assessed/performed       Past Medical History:  Diagnosis Date  . Allergy    Chronic  . Angio-edema   . Anxiety   . Asthma   . Beta thalassemia ([place])   . Eczema   . IBS (irritable bowel syndrome)   . Reflux   . Urticaria     Past Surgical History:  Procedure Laterality Date  . CYST REMOVAL HAND    . WISDOM TOOTH EXTRACTION  2016    There were no vitals filed for this visit.  Subjective Assessment - 06/01/19 0819    Subjective  Patient reported that her knee is feeling good today. She reported she is able to walk without her brace. She stated that the only issue she is having is with squat/kneeling. She reported still having difficulty with stairs at home. She stated her knee felt good at end of session.    Pertinent History  History of 5 years of dislocation prior to surgery    Patient Stated Goals  To walk better and be able to bend knee    Currently in Pain?  No/denies                        [facility] Adult PT Treatment/Exercise - 06/01/19 0001      Knee/Hip Exercises: Machines for Strengthening   Total Gym Leg Press  2x10 with 30# DBL leg      Knee/Hip Exercises: Standing   Heel Raises  10 reps;2 sets    Heel Raises Limitations  Squat to heel raise     Other Standing Knee Exercises  Stairs with reciprocal pattern ascending with minimal pain, and descending with step to pattern (4) 7-inch stairs x 1 with 1 UE assist. Kneeling onto foam using a foam block for UE assist x 2. Without foam block for assist x1 with cues to reach for the table next to her if needed - patient with significant difficulty with this - unable to perform    Other Standing Knee Exercises  Backwards resisted walking 20# x10. SLS with vectors on foam 10'' fwd/side/back x 3 LT LE. Therapist providing resistance with RTB to LT, RT, and anteriorly.              PT Education - 06/01/19 [phone]    Education  Details  Educated on using something soft to kneel on and having something nearby to hold onto as needed at home.    Person(s) Educated  Patient    Methods  Explanation;Demonstration    Comprehension  Verbalized understanding       PT Short Term Goals - 04/27/19 0954      PT SHORT TERM GOAL #1   Title  Patient will report understanding and regular compliance with HEP to improve ROM, strength and overall mobility.    Baseline  1/20 - doing HEP daily either 1x or 2x/day    Time  3    Period  Weeks    Status  Achieved    Target Date  04/14/19      PT SHORT TERM GOAL #2   Title  Within protocol, patient will demonstrate left knee extension/flexion active range of motion of at least 0-90 degrees to assist with more normalized gait pattern and stair ambulation.    Baseline  1/20 0-106    Time  3    Period  Weeks    Status  Achieved    Target Date  04/14/19        PT Long Term Goals - 04/27/19 0923      PT LONG TERM GOAL #1   Title  Patient will demonstrate  ability to ambulate at a gait velocity of at least 1.0 m/s with LRAD in order to improve safety with community ambulation.    Baseline  1/20: 0.655 m/s    Time  6    Period  Weeks    Status  On-going      PT LONG TERM GOAL #2   Title  Within protocol, patient will demonstrate left knee extension/flexion active range of motion of at least 0-125 degrees to assist with more normalized gait pattern and stair ambulation.    Baseline  1/20 0-106    Time  6    Period  Weeks    Status  On-going      PT LONG TERM GOAL #3   Title  Patient will demonstrate left lower extremity strength within functional limits in order to return to daily activities safely.    Baseline  Not formally assessed on this date    Time  6    Period  Weeks    Status  On-going      PT LONG TERM GOAL #4   Title  Patient will have circumferential measurement of left knee within 0.5-1 cm difference between the right knee indicating decreased edema for improved ease of mobility.    Time  6    Period  Weeks    Status  On-going      PT LONG TERM GOAL #5   Title  Patient will report ability to ambulate for at least 20 minutes without pain greater than 3 out of 10 in order to perform community activities such as grocery shopping with improved ease.    Time  6    Period  Weeks    Status  On-going            Plan - 06/01/19 0930    Clinical Impression Statement  Focused on functional strengthening and functional mobility this session including kneeling. Use a foam block underneath patient's operated leg as well as a foam block for upper extremity support initially. Patient demonstrated good form with this. Then therapist removed foam block and educated patient to reach for the table next to her if needed. Patient  was unable to perform this and instead sat down onto the ground, but did not reach for the table for support reporting that it felt like gravity was controlling the movement. Patient requested a rest break  following this, but reported that her knee felt fine after this. Also, observed patient navigating stairs this session with patient demonstrating a step-to gait pattern when descending and reporting some discomfort when ascending with a reciprocal pattern. Plan to re-assess patient next session.    Personal Factors and Comorbidities  Other   GAD   Examination-Activity Limitations  Bathing;Stand;Bed Mobility;Locomotion Level;Toileting;Bend;Transfers;Sit;Squat;Dressing;Hygiene/Grooming;Stairs    Examination-Participation Restrictions  Cleaning;Meal Prep;Community Activity;Driving;Shop;Laundry    Stability/Clinical Decision Making  Stable/Uncomplicated    Rehab Potential  Fair    PT Frequency  2x / week    PT Duration  4 weeks    PT Treatment/Interventions  ADLs/Self Care Home Management;Cryotherapy;Electrical Stimulation;Moist Heat;DME Instruction;Gait training;Stair training;Functional mobility training;Therapeutic activities;Therapeutic exercise;Balance training;Neuromuscular re-education;Patient/family education;Manual techniques;Passive range of motion;Dry needling;Energy conservation;Taping;Orthotic Fit/Training    PT Next Visit Plan  Re-assess next session. Continue to focus on stair negotiation and kneeling practice eccentric strengthening.    PT Home Exercise Plan  Continue exercises provided at hospital, ICE/elevate and ankle pumps  12/21:  QS, ham set, SL hip Add 04/13/19 Heel raises 3x10; 05/13/19: STS 3x10 daily; 05/20/19: Isometric abdominal exercise into physioball 5'' holds x15 2-3 sets daily       Patient will benefit from skilled therapeutic intervention in order to improve the following deficits and impairments:  Abnormal gait, Impaired sensation, Improper body mechanics, Pain, Decreased mobility, Decreased activity tolerance, Decreased endurance, Decreased range of motion, Decreased strength, Hypomobility, Decreased balance, Difficulty walking, Increased edema  Visit Diagnosis: Acute  pain of left knee  Stiffness of left knee, not elsewhere classified  Localized edema  Muscle weakness (generalized)  Other abnormalities of gait and mobility     Problem List Patient Active Problem List   Diagnosis Date Noted  . Generalized anxiety disorder 11/09/2017  . Chronic abdominal pain 10/16/2017   Verne Carrow PT, DPT 9:33 AM, 06/01/19 [phone]  [facility] [address] [place], [place], [address] Phone: [phone]   Fax:  [phone]  Name: Renee Savage MRN: [id number] Date of Birth: 02-28-2001

## 2019-06-03 ENCOUNTER — Other Ambulatory Visit: Payer: Self-pay

## 2019-06-03 ENCOUNTER — Encounter (HOSPITAL_COMMUNITY): Payer: Self-pay | Admitting: Physical Therapy

## 2019-06-03 ENCOUNTER — Ambulatory Visit (HOSPITAL_COMMUNITY): Payer: No Typology Code available for payment source | Admitting: Physical Therapy

## 2019-06-03 DIAGNOSIS — M25562 Pain in left knee: Secondary | ICD-10-CM

## 2019-06-03 DIAGNOSIS — R2689 Other abnormalities of gait and mobility: Secondary | ICD-10-CM

## 2019-06-03 DIAGNOSIS — M25662 Stiffness of left knee, not elsewhere classified: Secondary | ICD-10-CM

## 2019-06-03 DIAGNOSIS — M6281 Muscle weakness (generalized): Secondary | ICD-10-CM

## 2019-06-03 DIAGNOSIS — R600 Localized edema: Secondary | ICD-10-CM

## 2019-06-03 NOTE — Therapy (Addendum)
[facility] [address] [place], [place], [address] Phone: [phone]   Fax:  [phone]  Physical Therapy Treatment / Progress Note  Patient Details  Name: Renee Savage MRN: [id number] Date of Birth: 09/18/2000 Referring Provider (PT): Duwayne Heck, MD   Encounter Date: 06/03/2019   Progress Note Reporting Period 04/27/19 to 06/03/19  See note below for Objective Data and Assessment of Progress/Goals.       PT End of Session - 06/03/19 1034    Visit Number  19    Number of Visits  26    Date for PT Re-Evaluation  06/24/19    Authorization Type  Generic commercial (90 visit limit combined PT/OT/ST)    Authorization Time Period  06/03/19- 06/24/19    Authorization - Visit Number  9    Authorization - Number of Visits  9    PT Start Time  1031    PT Stop Time  1110    PT Time Calculation (min)  39 min    Equipment Utilized During Treatment  --    Activity Tolerance  Patient tolerated treatment well    Behavior During Therapy  WFL for tasks assessed/performed       Past Medical History:  Diagnosis Date  . Allergy    Chronic  . Angio-edema   . Anxiety   . Asthma   . Beta thalassemia (HCC)   . Eczema   . IBS (irritable bowel syndrome)   . Reflux   . Urticaria     Past Surgical History:  Procedure Laterality Date  . CYST REMOVAL HAND    . WISDOM TOOTH EXTRACTION  2016    There were no vitals filed for this visit.  Subjective Assessment - 06/03/19 1033    Subjective  Patient reported that she is feeling good and that her knee isn't really bothering her at all except a little discomfort when she sits.    Pertinent History  History of 5 years of dislocation prior to surgery    Patient Stated Goals  To walk better and be able to bend knee    Currently in Pain?  No/denies         [facility] PT Assessment - 06/03/19 0001      Assessment   Medical Diagnosis  S/P Lt arthroscopic medial patellofemoral ligament  reconstruction with hamstring allograft    Referring Provider (PT)  Duwayne Heck, MD    Next MD Visit  06/16/19      Circumferential Edema   Circumferential - Right  37 cm at joint line knee    Circumferential - Left   39 cm at joint line knee      AROM   Left Knee Extension  0    Left Knee Flexion  134      Strength   Right Hip Flexion  5/5    Right Hip Extension  4+/5    Right Hip ABduction  4+/5    Left Hip Flexion  5/5    Left Hip Extension  4+/5    Left Hip ABduction  4+/5    Right Knee Flexion  5/5    Right Knee Extension  5/5    Left Knee Flexion  5/5    Left Knee Extension  5/5    Right Ankle Dorsiflexion  5/5    Left Ankle Dorsiflexion  5/5      Ambulation/Gait   Ambulation/Gait  Yes    Ambulation/Gait Assistance  5: Supervision  Ambulation Distance (Feet)  415 Feet   2MWT   Gait velocity  1.05 m/s    Stairs  Yes    Stair Management Technique  One rail Right    Number of Stairs  4    Height of Stairs  7    Gait Comments  Ascending with good form. Descending with weight shift off of the LLE and significant UE assist.                    [place] Adult PT Treatment/Exercise - 06/03/19 0001      Knee/Hip Exercises: Machines for Strengthening   Total Gym Leg Press  2x10 with 30# DBL leg    Other Machine  Power tower 2x10 set at 18 degrees SL squat      Knee/Hip Exercises: Standing   Heel Raises  10 reps;2 sets    Heel Raises Limitations  Squat to heel raise     Lateral Step Up  2 sets;Step Height: 4";Left;Hand Hold: 2    Lateral Step Up Limitations  8 reps. Heel tap             PT Education - 06/03/19 1250    Education Details  Discussed re-assessment findings and purpose/technique of interventions.    Person(s) Educated  Patient    Methods  Explanation    Comprehension  Verbalized understanding       PT Short Term Goals - 04/27/19 0954      PT SHORT TERM GOAL #1   Title  Patient will report understanding and regular compliance with  HEP to improve ROM, strength and overall mobility.    Baseline  1/20 - doing HEP daily either 1x or 2x/day    Time  3    Period  Weeks    Status  Achieved    Target Date  04/14/19      PT SHORT TERM GOAL #2   Title  Within protocol, patient will demonstrate left knee extension/flexion active range of motion of at least 0-90 degrees to assist with more normalized gait pattern and stair ambulation.    Baseline  1/20 0-106    Time  3    Period  Weeks    Status  Achieved    Target Date  04/14/19        PT Long Term Goals - 06/03/19 1055      PT LONG TERM GOAL #1   Title  Patient will demonstrate ability to ambulate at a gait velocity of at least 1.0 m/s with LRAD in order to improve safety with community ambulation.    Baseline  06/03/19: 1.05 m/s on 2MWT no AD    Time  6    Period  Weeks    Status  Achieved      PT LONG TERM GOAL #2   Title  Within protocol, patient will demonstrate left knee extension/flexion active range of motion of at least 0-125 degrees to assist with more normalized gait pattern and stair ambulation.    Baseline  06/03/19: 0-134 degrees    Time  6    Period  Weeks    Status  Achieved      PT LONG TERM GOAL #3   Title  Patient will demonstrate left lower extremity strength within functional limits in order to return to daily activities safely.    Baseline  06/03/19: Limited functional strength as evidence of stairs see objective measures    Time  3    Period  Weeks    Status  On-going    Target Date  06/24/19      PT LONG TERM GOAL #4   Title  Patient will have circumferential measurement of left knee within 0.5-1 cm difference between the right knee indicating decreased edema for improved ease of mobility.    Baseline  06/03/19: See objective measures    Time  3    Period  Weeks    Status  On-going    Target Date  06/24/19      PT LONG TERM GOAL #5   Title  Patient will report ability to ambulate for at least 20 minutes without pain greater than 3  out of 10 in order to perform community activities such as grocery shopping with improved ease.    Time  6    Period  Weeks    Status  Achieved      Additional Long Term Goals   Additional Long Term Goals  Yes            Plan - 06/03/19 1254    Clinical Impression Statement  Performed a re-assessment of patient's progress towards goals. Patient achieved all short term goals. Patient achieved 3 out of 5 long term goals. Patient continues to demonstrate difficulty with eccentric control particularly with stair negotiation. Focused remainder of session on this incorporating Power Tower for targeting eccentric control on the operated LE. Plan to continue physical therapy for an additional 2 times a week for 3 weeks.    Personal Factors and Comorbidities  Other   GAD   Examination-Activity Limitations  Bathing;Stand;Bed Mobility;Locomotion Level;Toileting;Bend;Transfers;Sit;Squat;Dressing;Hygiene/Grooming;Stairs    Examination-Participation Restrictions  Cleaning;Meal Prep;Community Activity;Driving;Shop;Laundry    Stability/Clinical Decision Making  Stable/Uncomplicated    Rehab Potential  Fair    PT Frequency  2x / week    PT Duration  3 weeks    PT Treatment/Interventions  ADLs/Self Care Home Management;Cryotherapy;Electrical Stimulation;Moist Heat;DME Instruction;Gait training;Stair training;Functional mobility training;Therapeutic activities;Therapeutic exercise;Balance training;Neuromuscular re-education;Patient/family education;Manual techniques;Passive range of motion;Dry needling;Energy conservation;Taping;Orthotic Fit/Training    PT Next Visit Plan  Continue to focus on stair negotiation and kneeling practice eccentric strengthening.    PT Home Exercise Plan  Continue exercises provided at hospital, ICE/elevate and ankle pumps  12/21:  QS, ham set, SL hip Add 04/13/19 Heel raises 3x10; 05/13/19: STS 3x10 daily; 05/20/19: Isometric abdominal exercise into physioball 5'' holds x15 2-3 sets  daily       Patient will benefit from skilled therapeutic intervention in order to improve the following deficits and impairments:  Abnormal gait, Impaired sensation, Improper body mechanics, Pain, Decreased mobility, Decreased activity tolerance, Decreased endurance, Decreased range of motion, Decreased strength, Hypomobility, Decreased balance, Difficulty walking, Increased edema  Visit Diagnosis: Acute pain of left knee  Stiffness of left knee, not elsewhere classified  Muscle weakness (generalized)  Localized edema  Other abnormalities of gait and mobility     Problem List Patient Active Problem List   Diagnosis Date Noted  . Generalized anxiety disorder 11/09/2017  . Chronic abdominal pain 10/16/2017   Verne Carrow PT, DPT 12:57 PM, 06/03/19 [phone]  [facility] [address] [place], [place], [address] Phone: [phone]   Fax:  [phone]  Name: KY MOSKOWITZ MRN: [id number] Date of Birth: 2001-01-31

## 2019-06-07 ENCOUNTER — Ambulatory Visit (HOSPITAL_COMMUNITY): Payer: No Typology Code available for payment source | Attending: Orthopedic Surgery

## 2019-06-07 ENCOUNTER — Other Ambulatory Visit: Payer: Self-pay

## 2019-06-07 ENCOUNTER — Encounter (HOSPITAL_COMMUNITY): Payer: Self-pay

## 2019-06-07 DIAGNOSIS — M6281 Muscle weakness (generalized): Secondary | ICD-10-CM | POA: Insufficient documentation

## 2019-06-07 DIAGNOSIS — R600 Localized edema: Secondary | ICD-10-CM | POA: Insufficient documentation

## 2019-06-07 DIAGNOSIS — M25662 Stiffness of left knee, not elsewhere classified: Secondary | ICD-10-CM | POA: Diagnosis present

## 2019-06-07 DIAGNOSIS — M25562 Pain in left knee: Secondary | ICD-10-CM | POA: Insufficient documentation

## 2019-06-07 DIAGNOSIS — R2689 Other abnormalities of gait and mobility: Secondary | ICD-10-CM | POA: Insufficient documentation

## 2019-06-07 NOTE — Therapy (Addendum)
[facility] [address] [place], [place], [address] Phone: [phone]   Fax:  [phone]  Physical Therapy Treatment  Patient Details  Name: Renee Savage MRN: [id number] Date of Birth: 02-10-2001 Referring Provider (PT): Duwayne Heck, MD   Encounter Date: 06/07/2019  PT End of Session - 06/07/19 1523    Visit Number  20    Number of Visits  26    Date for PT Re-Evaluation  06/24/19    Authorization Type  Generic commercial (90 visit limit combined PT/OT/ST)    Authorization Time Period  06/03/19- 06/24/19    Authorization - Visit Number  1   Authorization - Number of Visits  10   PT Start Time  1516    PT Stop Time  1600    PT Time Calculation (min)  44 min    Activity Tolerance  Patient tolerated treatment well    Behavior During Therapy  [facility] for tasks assessed/performed       Past Medical History:  Diagnosis Date  . Allergy    Chronic  . Angio-edema   . Anxiety   . Asthma   . Beta thalassemia (HCC)   . Eczema   . IBS (irritable bowel syndrome)   . Reflux   . Urticaria     Past Surgical History:  Procedure Laterality Date  . CYST REMOVAL HAND    . WISDOM TOOTH EXTRACTION  2016    There were no vitals filed for this visit.  Subjective Assessment - 06/07/19 1521    Subjective  Pt reports feeling good today. Pt reports taking an exam this morning. Pt reports no issues after last session.    Pertinent History  History of 5 years of dislocation prior to surgery    Limitations  Walking;Standing    How long can you sit comfortably?  Varies    How long can you stand comfortably?  5 minutes    How long can you walk comfortably?  5 minutes with crutches    Patient Stated Goals  To walk better and be able to bend knee    Currently in Pain?  No/denies                [facility] Adult PT Treatment/Exercise - 06/07/19 0001      Knee/Hip Exercises: Stretches   Passive Hamstring Stretch  Both;2 reps;30 seconds    Passive Hamstring Stretch Limitations  12" step    Gastroc Stretch  Both;2 reps;30 seconds      Knee/Hip Exercises: Machines for Strengthening   Total Gym Leg Press  15 reps, 30# DBL leg    Other Machine  Power tower 2x10 set at [employer]      Knee/Hip Exercises: Standing   Heel Raises  20 reps    Heel Raises Limitations  Squat to heel raise     Lateral Step Up  Left;10 reps;1 set;Hand Hold: 1;Step Height: 4"    Lateral Step Up Limitations  heel tap    Other Standing Knee Exercises  wall sits, 5x5 sec hold; lateral sidestepping, RTB around thighs with BUE support on // bars, x10 reps each direction             PT Education - 06/07/19 1522    Education Details  Exercise technique, continue HEP    Person(s) Educated  Patient    Methods  Explanation    Comprehension  Verbalized understanding       PT Short Term Goals -  04/27/19 0954      PT SHORT TERM GOAL #1   Title  Patient will report understanding and regular compliance with HEP to improve ROM, strength and overall mobility.    Baseline  1/20 - doing HEP daily either 1x or 2x/day    Time  3    Period  Weeks    Status  Achieved    Target Date  04/14/19      PT SHORT TERM GOAL #2   Title  Within protocol, patient will demonstrate left knee extension/flexion active range of motion of at least 0-90 degrees to assist with more normalized gait pattern and stair ambulation.    Baseline  1/20 0-106    Time  3    Period  Weeks    Status  Achieved    Target Date  04/14/19        PT Long Term Goals - 06/03/19 1055      PT LONG TERM GOAL #1   Title  Patient will demonstrate ability to ambulate at a gait velocity of at least 1.0 m/s with LRAD in order to improve safety with community ambulation.    Baseline  06/03/19: 1.05 m/s on no AD    Time  6    Period  Weeks    Status  Achieved      PT LONG TERM GOAL #2   Title  Within protocol, patient will demonstrate left knee extension/flexion active range of motion of  at least 0-125 degrees to assist with more normalized gait pattern and stair ambulation.    Baseline  06/03/19: 0-134 degrees    Time  6    Period  Weeks    Status  Achieved      PT LONG TERM GOAL #3   Title  Patient will demonstrate left lower extremity strength within functional limits in order to return to daily activities safely.    Baseline  06/03/19: Limited functional strength as evidence of stairs see objective measures    Time  3    Period  Weeks    Status  On-going    Target Date  06/24/19      PT LONG TERM GOAL #4   Title  Patient will have circumferential measurement of left knee within 0.5-1 cm difference between the right knee indicating decreased edema for improved ease of mobility.    Baseline  06/03/19: See objective measures    Time  3    Period  Weeks    Status  On-going    Target Date  06/24/19      PT LONG TERM GOAL #5   Title  Patient will report ability to ambulate for at least 20 minutes without pain greater than 3 out of 10 in order to perform community activities such as grocery shopping with improved ease.    Time  6    Period  Weeks    Status  Achieved      Additional Long Term Goals   Additional Long Term Goals  Yes            Plan - 06/07/19 1653    Clinical Impression Statement  Continued with LLE strengthening this date. Pt requires frequent verbal cues for form to encourage pressing through heel of L foot to engage hamstrings and glutes to alleviate discomfort in L quad, also to maintain knees over feet out of valgus position. Added wall sits with initial discomfort, able to improve with verbal cues and demonstration. Pt  continues to be hesitant with stair negotiation, demonstrating nonreciprocal pattern, and use of single handrail for comfort. Pt tolerates lateral sidestepping with theraband for added abduction activation. Pt reports 1/10 pain at EOS. Continue to progress as able.    Personal Factors and Comorbidities  Other   GAD    Examination-Activity Limitations  Bathing;Stand;Bed Mobility;Locomotion Level;Toileting;Bend;Transfers;Sit;Squat;Dressing;Hygiene/Grooming;Stairs    Examination-Participation Restrictions  Cleaning;Meal Prep;Community Activity;Driving;Shop;Laundry    Stability/Clinical Decision Making  Stable/Uncomplicated    Rehab Potential  Fair    PT Frequency  2x / week    PT Duration  3 weeks    PT Treatment/Interventions  ADLs/Self Care Home Management;Cryotherapy;Electrical Stimulation;Moist Heat;DME Instruction;Gait training;Stair training;Functional mobility training;Therapeutic activities;Therapeutic exercise;Balance training;Neuromuscular re-education;Patient/family education;Manual techniques;Passive range of motion;Dry needling;Energy conservation;Taping;Orthotic Fit/Training    PT Next Visit Plan  Continue to focus on stair negotiation and kneeling practice eccentric strengthening of BLE.    PT Home Exercise Plan  Continue exercises provided at hospital, ICE/elevate and ankle pumps  12/21:  QS, ham set, SL hip Add 04/13/19 Heel raises 3x10; 05/13/19: STS 3x10 daily; 05/20/19: Isometric abdominal exercise into physioball 5'' holds x15 2-3 sets daily    Consulted and Agree with Plan of Care  Patient       Patient will benefit from skilled therapeutic intervention in order to improve the following deficits and impairments:  Abnormal gait, Impaired sensation, Improper body mechanics, Pain, Decreased mobility, Decreased activity tolerance, Decreased endurance, Decreased range of motion, Decreased strength, Hypomobility, Decreased balance, Difficulty walking, Increased edema  Visit Diagnosis: Acute pain of left knee  Stiffness of left knee, not elsewhere classified  Muscle weakness (generalized)  Localized edema  Other abnormalities of gait and mobility     Problem List Patient Active Problem List   Diagnosis Date Noted  . Generalized anxiety disorder 11/09/2017  . Chronic abdominal pain  10/16/2017     Talbot Grumbling PT, DPT 06/07/19, 5:01 PM [place] [address] [place], [place], [address] Phone: [phone]   Fax:  [phone]  Name: Renee Savage MRN: [id number] Date of Birth: 2000/10/23

## 2019-06-08 ENCOUNTER — Other Ambulatory Visit (HOSPITAL_COMMUNITY): Payer: Self-pay | Admitting: Psychiatry

## 2019-06-10 ENCOUNTER — Encounter (HOSPITAL_COMMUNITY): Payer: Self-pay | Admitting: Physical Therapy

## 2019-06-10 ENCOUNTER — Other Ambulatory Visit: Payer: Self-pay

## 2019-06-10 ENCOUNTER — Ambulatory Visit (HOSPITAL_COMMUNITY): Payer: No Typology Code available for payment source | Admitting: Physical Therapy

## 2019-06-10 DIAGNOSIS — R600 Localized edema: Secondary | ICD-10-CM

## 2019-06-10 DIAGNOSIS — M25562 Pain in left knee: Secondary | ICD-10-CM | POA: Diagnosis not present

## 2019-06-10 DIAGNOSIS — M25662 Stiffness of left knee, not elsewhere classified: Secondary | ICD-10-CM

## 2019-06-10 DIAGNOSIS — M6281 Muscle weakness (generalized): Secondary | ICD-10-CM

## 2019-06-10 DIAGNOSIS — R2689 Other abnormalities of gait and mobility: Secondary | ICD-10-CM

## 2019-06-10 NOTE — Therapy (Signed)
[facility] [address] [place], [place], [address] Phone: [phone]   Fax:  [phone]  Physical Therapy Treatment  Patient Details  Name: Renee Savage MRN: [id number] Date of Birth: Jan 07, 2001 Referring Provider (PT): Duwayne Heck, MD   Encounter Date: 06/10/2019  PT End of Session - 06/10/19 0837    Visit Number  21    Number of Visits  26    Date for PT Re-Evaluation  06/24/19    Authorization Type  Generic commercial (90 visit limit combined PT/OT/ST)    Authorization Time Period  06/03/19- 06/24/19    Authorization - Visit Number  2    Authorization - Number of Visits  10    PT Start Time  0817    PT Stop Time  0855    PT Time Calculation (min)  38 min    Activity Tolerance  Patient tolerated treatment well    Behavior During Therapy  [facility] for tasks assessed/performed       Past Medical History:  Diagnosis Date  . Allergy    Chronic  . Angio-edema   . Anxiety   . Asthma   . Beta thalassemia (HCC)   . Eczema   . IBS (irritable bowel syndrome)   . Reflux   . Urticaria     Past Surgical History:  Procedure Laterality Date  . CYST REMOVAL HAND    . WISDOM TOOTH EXTRACTION  2016    There were no vitals filed for this visit.  Subjective Assessment - 06/10/19 0819    Subjective  Patient reported feeling okay after last session.    Pertinent History  History of 5 years of dislocation prior to surgery    Limitations  Walking;Standing    How long can you sit comfortably?  Varies    How long can you stand comfortably?  5 minutes    How long can you walk comfortably?  5 minutes with crutches    Patient Stated Goals  To walk better and be able to bend knee    Currently in Pain?  No/denies                       [facility] Adult PT Treatment/Exercise - 06/10/19 0001      Knee/Hip Exercises: Stretches   Passive Hamstring Stretch  Both;2 reps;30 seconds    Passive Hamstring Stretch Limitations  12" step     Gastroc Stretch  Both;2 reps;30 seconds      Knee/Hip Exercises: Machines for Strengthening   Total Gym Leg Press  15 reps, 30# DBL leg      Knee/Hip Exercises: Standing   Heel Raises  20 reps    Heel Raises Limitations  Squat to heel raise     Lateral Step Up  Left;10 reps;1 set;Hand Hold: 1;Step Height: 4"    Lateral Step Up Limitations  heel tap    Other Standing Knee Exercises  wall sits, 5x5 sec hold; lateral sidestepping, RTB around thighs with BUE support on // bars, x10 reps each direction             PT Education - 06/10/19 0907    Education Details  Discussed purpose and technique of interventions and proper form of exercises.    Person(s) Educated  Patient    Methods  Explanation    Comprehension  Verbalized understanding       PT Short Term Goals - 04/27/19 [phone]  PT SHORT TERM GOAL #1   Title  Patient will report understanding and regular compliance with HEP to improve ROM, strength and overall mobility.    Baseline  1/20 - doing HEP daily either 1x or 2x/day    Time  3    Period  Weeks    Status  Achieved    Target Date  04/14/19      PT SHORT TERM GOAL #2   Title  Within protocol, patient will demonstrate left knee extension/flexion active range of motion of at least 0-90 degrees to assist with more normalized gait pattern and stair ambulation.    Baseline  1/20 0-106    Time  3    Period  Weeks    Status  Achieved    Target Date  04/14/19        PT Long Term Goals - 06/03/19 1055      PT LONG TERM GOAL #1   Title  Patient will demonstrate ability to ambulate at a gait velocity of at least 1.0 m/s with LRAD in order to improve safety with community ambulation.    Baseline  06/03/19: 1.05 m/s on 2MWT no AD    Time  6    Period  Weeks    Status  Achieved      PT LONG TERM GOAL #2   Title  Within protocol, patient will demonstrate left knee extension/flexion active range of motion of at least 0-125 degrees to assist with more normalized gait  pattern and stair ambulation.    Baseline  06/03/19: 0-134 degrees    Time  6    Period  Weeks    Status  Achieved      PT LONG TERM GOAL #3   Title  Patient will demonstrate left lower extremity strength within functional limits in order to return to daily activities safely.    Baseline  06/03/19: Limited functional strength as evidence of stairs see objective measures    Time  3    Period  Weeks    Status  On-going    Target Date  06/24/19      PT LONG TERM GOAL #4   Title  Patient will have circumferential measurement of left knee within 0.5-1 cm difference between the right knee indicating decreased edema for improved ease of mobility.    Baseline  06/03/19: See objective measures    Time  3    Period  Weeks    Status  On-going    Target Date  06/24/19      PT LONG TERM GOAL #5   Title  Patient will report ability to ambulate for at least 20 minutes without pain greater than 3 out of 10 in order to perform community activities such as grocery shopping with improved ease.    Time  6    Period  Weeks    Status  Achieved      Additional Long Term Goals   Additional Long Term Goals  Yes            Plan - 06/10/19 0843    Clinical Impression Statement  Continued with functional strengthening this session. Patient with some reported discomfort with squats. Therapist educated patient on decreasing anterior translation of tibia. Patient required cueing as well to reduce valgus particularly with heel taps. Patient reported feeling good at end of session. Patient would benefit from continued skilled physical therapy to continue progressing towards functional goals.    Personal Factors and Comorbidities  Other   GAD   Examination-Activity Limitations  Bathing;Stand;Bed Mobility;Locomotion Level;Toileting;Bend;Transfers;Sit;Squat;Dressing;Hygiene/Grooming;Stairs    Examination-Participation Restrictions  Cleaning;Meal Prep;Community Activity;Driving;Shop;Laundry    Stability/Clinical  Decision Making  Stable/Uncomplicated    Rehab Potential  Fair    PT Frequency  2x / week    PT Duration  3 weeks    PT Treatment/Interventions  ADLs/Self Care Home Management;Cryotherapy;Electrical Stimulation;Moist Heat;DME Instruction;Gait training;Stair training;Functional mobility training;Therapeutic activities;Therapeutic exercise;Balance training;Neuromuscular re-education;Patient/family education;Manual techniques;Passive range of motion;Dry needling;Energy conservation;Taping;Orthotic Fit/Training    PT Next Visit Plan  Continue to focus on stair negotiation and kneeling practice eccentric strengthening of BLE.    PT Home Exercise Plan  Continue exercises provided at hospital, ICE/elevate and ankle pumps  12/21:  QS, ham set, SL hip Add 04/13/19 Heel raises 3x10; 05/13/19: STS 3x10 daily; 05/20/19: Isometric abdominal exercise into physioball 5'' holds x15 2-3 sets daily    Consulted and Agree with Plan of Care  Patient       Patient will benefit from skilled therapeutic intervention in order to improve the following deficits and impairments:  Abnormal gait, Impaired sensation, Improper body mechanics, Pain, Decreased mobility, Decreased activity tolerance, Decreased endurance, Decreased range of motion, Decreased strength, Hypomobility, Decreased balance, Difficulty walking, Increased edema  Visit Diagnosis: Acute pain of left knee  Stiffness of left knee, not elsewhere classified  Muscle weakness (generalized)  Localized edema  Other abnormalities of gait and mobility     Problem List Patient Active Problem List   Diagnosis Date Noted  . Generalized anxiety disorder 11/09/2017  . Chronic abdominal pain 10/16/2017   Verne Carrow PT, DPT 9:09 AM, 06/10/19 [phone]  [facility] [address] [place], [place], [address] Phone: [phone]   Fax:  [phone]  Name: Renee Savage MRN: [id number] Date of Birth: 01/02/2001

## 2019-06-15 ENCOUNTER — Encounter (HOSPITAL_COMMUNITY): Payer: Self-pay | Admitting: Physical Therapy

## 2019-06-15 ENCOUNTER — Ambulatory Visit (HOSPITAL_COMMUNITY): Payer: No Typology Code available for payment source | Admitting: Physical Therapy

## 2019-06-15 ENCOUNTER — Other Ambulatory Visit: Payer: Self-pay

## 2019-06-15 DIAGNOSIS — M25662 Stiffness of left knee, not elsewhere classified: Secondary | ICD-10-CM

## 2019-06-15 DIAGNOSIS — M25562 Pain in left knee: Secondary | ICD-10-CM | POA: Diagnosis not present

## 2019-06-15 DIAGNOSIS — R2689 Other abnormalities of gait and mobility: Secondary | ICD-10-CM

## 2019-06-15 DIAGNOSIS — M6281 Muscle weakness (generalized): Secondary | ICD-10-CM

## 2019-06-15 DIAGNOSIS — R600 Localized edema: Secondary | ICD-10-CM

## 2019-06-15 NOTE — Therapy (Signed)
[facility] [address] [place], [place], [address] Phone: [phone]   Fax:  [phone]  Physical Therapy Treatment  Patient Details  Name: Renee Savage MRN: [id number] Date of Birth: 2001/02/18 Referring Provider (PT): Duwayne Heck, MD   Encounter Date: 06/15/2019  PT End of Session - 06/15/19 1014    Visit Number  22    Number of Visits  26    Date for PT Re-Evaluation  06/24/19    Authorization Type  Generic commercial (90 visit limit combined PT/OT/ST)    Authorization Time Period  06/03/19- 06/24/19    Authorization - Visit Number  3    Authorization - Number of Visits  10    PT Start Time  0951    PT Stop Time  1029    PT Time Calculation (min)  38 min    Activity Tolerance  Patient tolerated treatment well    Behavior During Therapy  [facility] for tasks assessed/performed       Past Medical History:  Diagnosis Date  . Allergy    Chronic  . Angio-edema   . Anxiety   . Asthma   . Beta thalassemia (HCC)   . Eczema   . IBS (irritable bowel syndrome)   . Reflux   . Urticaria     Past Surgical History:  Procedure Laterality Date  . CYST REMOVAL HAND    . WISDOM TOOTH EXTRACTION  2016    There were no vitals filed for this visit.  Subjective Assessment - 06/15/19 0954    Subjective  Patient reported that she had some knee pain earlier, but she doesn't have any now.    Pertinent History  History of 5 years of dislocation prior to surgery    Limitations  Walking;Standing    How long can you sit comfortably?  Varies    How long can you stand comfortably?  5 minutes    How long can you walk comfortably?  5 minutes with crutches    Patient Stated Goals  To walk better and be able to bend knee    Currently in Pain?  No/denies                       [facility] Adult PT Treatment/Exercise - 06/15/19 0001      Knee/Hip Exercises: Stretches   Passive Hamstring Stretch  Both;2 reps;30 seconds    Passive  Hamstring Stretch Limitations  12" step    Gastroc Stretch  Both;2 reps;30 seconds      Knee/Hip Exercises: Machines for Strengthening   Total Gym Leg Press  15 reps, 30# DBL leg      Knee/Hip Exercises: Standing   Heel Raises  20 reps    Heel Raises Limitations  Squat to heel raise     Lateral Step Up  Left;10 reps;1 set;Hand Hold: 1;Step Height: 4"    Lateral Step Up Limitations  heel tap    Step Down  Left;1 set;10 reps;Hand Hold: 2;Step Height: 4";Right    Other Standing Knee Exercises  wall sits, 5x5 sec hold               PT Short Term Goals - 04/27/19 0954      PT SHORT TERM GOAL #1   Title  Patient will report understanding and regular compliance with HEP to improve ROM, strength and overall mobility.    Baseline  1/20 - doing HEP daily either 1x or 2x/day  Time  3    Period  Weeks    Status  Achieved    Target Date  04/14/19      PT SHORT TERM GOAL #2   Title  Within protocol, patient will demonstrate left knee extension/flexion active range of motion of at least 0-90 degrees to assist with more normalized gait pattern and stair ambulation.    Baseline  1/20 0-106    Time  3    Period  Weeks    Status  Achieved    Target Date  04/14/19        PT Long Term Goals - 06/03/19 1055      PT LONG TERM GOAL #1   Title  Patient will demonstrate ability to ambulate at a gait velocity of at least 1.0 m/s with LRAD in order to improve safety with community ambulation.    Baseline  06/03/19: 1.05 m/s on 2MWT no AD    Time  6    Period  Weeks    Status  Achieved      PT LONG TERM GOAL #2   Title  Within protocol, patient will demonstrate left knee extension/flexion active range of motion of at least 0-125 degrees to assist with more normalized gait pattern and stair ambulation.    Baseline  06/03/19: 0-134 degrees    Time  6    Period  Weeks    Status  Achieved      PT LONG TERM GOAL #3   Title  Patient will demonstrate left lower extremity strength within  functional limits in order to return to daily activities safely.    Baseline  06/03/19: Limited functional strength as evidence of stairs see objective measures    Time  3    Period  Weeks    Status  On-going    Target Date  06/24/19      PT LONG TERM GOAL #4   Title  Patient will have circumferential measurement of left knee within 0.5-1 cm difference between the right knee indicating decreased edema for improved ease of mobility.    Baseline  06/03/19: See objective measures    Time  3    Period  Weeks    Status  On-going    Target Date  06/24/19      PT LONG TERM GOAL #5   Title  Patient will report ability to ambulate for at least 20 minutes without pain greater than 3 out of 10 in order to perform community activities such as grocery shopping with improved ease.    Time  6    Period  Weeks    Status  Achieved      Additional Long Term Goals   Additional Long Term Goals  Yes            Plan - 06/15/19 1026    Clinical Impression Statement  Focused on functional strengthening this session. Added step-downs on 4-inch step this session for improved eccentric control. Patient demonstrated good form with upper extremity assistance. She did report some discomfort at the end of the set with the step downs. Patient would benefit from continuing progressing towards being able to perform step-downs on a higher, step which is closer to a more standard height.    Personal Factors and Comorbidities  Other   GAD   Examination-Activity Limitations  Bathing;Stand;Bed Mobility;Locomotion Level;Toileting;Bend;Transfers;Sit;Squat;Dressing;Hygiene/Grooming;Stairs    Examination-Participation Restrictions  Cleaning;Meal Prep;Community Activity;Driving;Shop;Laundry    Stability/Clinical Decision Making  Stable/Uncomplicated    Rehab Potential  Fair    PT Frequency  2x / week    PT Duration  3 weeks    PT Treatment/Interventions  ADLs/Self Care Home Management;Cryotherapy;Electrical  Stimulation;Moist Heat;DME Instruction;Gait training;Stair training;Functional mobility training;Therapeutic activities;Therapeutic exercise;Balance training;Neuromuscular re-education;Patient/family education;Manual techniques;Passive range of motion;Dry needling;Energy conservation;Taping;Orthotic Fit/Training    PT Next Visit Plan  Continue to focus on stair negotiation and kneeling practice eccentric strengthening of BLE.    PT Home Exercise Plan  Continue exercises provided at hospital, ICE/elevate and ankle pumps  12/21:  QS, ham set, SL hip Add 04/13/19 Heel raises 3x10; 05/13/19: STS 3x10 daily; 05/20/19: Isometric abdominal exercise into physioball 5'' holds x15 2-3 sets daily    Consulted and Agree with Plan of Care  Patient       Patient will benefit from skilled therapeutic intervention in order to improve the following deficits and impairments:  Abnormal gait, Impaired sensation, Improper body mechanics, Pain, Decreased mobility, Decreased activity tolerance, Decreased endurance, Decreased range of motion, Decreased strength, Hypomobility, Decreased balance, Difficulty walking, Increased edema  Visit Diagnosis: Acute pain of left knee  Stiffness of left knee, not elsewhere classified  Muscle weakness (generalized)  Localized edema  Other abnormalities of gait and mobility     Problem List Patient Active Problem List   Diagnosis Date Noted  . Generalized anxiety disorder 11/09/2017  . Chronic abdominal pain 10/16/2017   Verne Carrow PT, DPT 10:34 AM, 06/15/19 [phone]  [facility] [address] [place], [place], [address] Phone: [phone]   Fax:  [phone]  Name: CHISTINA ROSTON MRN: [id number] Date of Birth: 2000/07/11

## 2019-06-16 DIAGNOSIS — M222X1 Patellofemoral disorders, right knee: Secondary | ICD-10-CM | POA: Insufficient documentation

## 2019-06-17 ENCOUNTER — Ambulatory Visit (HOSPITAL_COMMUNITY): Payer: No Typology Code available for payment source | Admitting: Physical Therapy

## 2019-06-17 ENCOUNTER — Encounter (HOSPITAL_COMMUNITY): Payer: Self-pay | Admitting: Physical Therapy

## 2019-06-17 ENCOUNTER — Other Ambulatory Visit: Payer: Self-pay

## 2019-06-17 DIAGNOSIS — M25562 Pain in left knee: Secondary | ICD-10-CM

## 2019-06-17 DIAGNOSIS — R600 Localized edema: Secondary | ICD-10-CM

## 2019-06-17 DIAGNOSIS — M25662 Stiffness of left knee, not elsewhere classified: Secondary | ICD-10-CM

## 2019-06-17 DIAGNOSIS — M6281 Muscle weakness (generalized): Secondary | ICD-10-CM

## 2019-06-17 DIAGNOSIS — R2689 Other abnormalities of gait and mobility: Secondary | ICD-10-CM

## 2019-06-17 NOTE — Therapy (Signed)
[facility] [address] [place], [place], [address] Phone: [phone]   Fax:  [phone]  Physical Therapy Treatment  Patient Details  Name: Renee Savage MRN: [id number] Date of Birth: 2000-11-09 Referring Provider (PT): Duwayne Heck, MD   Encounter Date: 06/17/2019  PT End of Session - 06/17/19 1047    Visit Number  23    Number of Visits  26    Date for PT Re-Evaluation  06/24/19    Authorization Type  Generic commercial (90 visit limit combined PT/OT/ST)    Authorization Time Period  06/03/19- 06/24/19    Authorization - Visit Number  3    Authorization - Number of Visits  10    PT Start Time  1035    PT Stop Time  1114    PT Time Calculation (min)  39 min    Activity Tolerance  Patient tolerated treatment well    Behavior During Therapy  [facility] for tasks assessed/performed       Past Medical History:  Diagnosis Date  . Allergy    Chronic  . Angio-edema   . Anxiety   . Asthma   . Beta thalassemia (HCC)   . Eczema   . IBS (irritable bowel syndrome)   . Reflux   . Urticaria     Past Surgical History:  Procedure Laterality Date  . CYST REMOVAL HAND    . WISDOM TOOTH EXTRACTION  2016    There were no vitals filed for this visit.  Subjective Assessment - 06/17/19 1037    Subjective  Patient reported that when she went to the MD yesterday, he said the deficits she's having are expected and to continue lower extremity strengthening. Patient denied any pain currently.    Pertinent History  History of 5 years of dislocation prior to surgery    Limitations  Walking;Standing    How long can you sit comfortably?  Varies    How long can you stand comfortably?  5 minutes    How long can you walk comfortably?  5 minutes with crutches    Patient Stated Goals  To walk better and be able to bend knee    Currently in Pain?  No/denies                       [facility] Adult PT Treatment/Exercise - 06/17/19 0001       Ambulation/Gait   Ambulation/Gait  Yes    Stairs  Yes    Stair Management Technique  One rail Right    Number of Stairs  4    Height of Stairs  7    Gait Comments  Reciprocal pattern but noticable decreased stance time on the descent on the LLE      Knee/Hip Exercises: Stretches   Passive Hamstring Stretch  Both;2 reps;30 seconds    Passive Hamstring Stretch Limitations  12" step    Gastroc Stretch  Both;2 reps;30 seconds    Gastroc Stretch Limitations  on slant board      Knee/Hip Exercises: Machines for Strengthening   Total Gym Leg Press  2x10, 30# DBL leg      Knee/Hip Exercises: Standing   Heel Raises  20 reps    Heel Raises Limitations  Squat to heel raise     Lateral Step Up  Left;10 reps;1 set;Hand Hold: 1;Step Height: 6"    Lateral Step Up Limitations  heel tap    Step Down  Left;1  set;10 reps;Hand Hold: 2;Right;Step Height: 6"    Other Standing Knee Exercises  wall sits, 5x5 sec hold    Other Standing Knee Exercises  Resisted backwards walking 40# x 10. Rockerboard 2x1 minute laterally for muscular control               PT Short Term Goals - 04/27/19 0954      PT SHORT TERM GOAL #1   Title  Patient will report understanding and regular compliance with HEP to improve ROM, strength and overall mobility.    Baseline  1/20 - doing HEP daily either 1x or 2x/day    Time  3    Period  Weeks    Status  Achieved    Target Date  04/14/19      PT SHORT TERM GOAL #2   Title  Within protocol, patient will demonstrate left knee extension/flexion active range of motion of at least 0-90 degrees to assist with more normalized gait pattern and stair ambulation.    Baseline  1/20 0-106    Time  3    Period  Weeks    Status  Achieved    Target Date  04/14/19        PT Long Term Goals - 06/03/19 1055      PT LONG TERM GOAL #1   Title  Patient will demonstrate ability to ambulate at a gait velocity of at least 1.0 m/s with LRAD in order to improve safety with  community ambulation.    Baseline  06/03/19: 1.05 m/s on no AD    Time  6    Period  Weeks    Status  Achieved      PT LONG TERM GOAL #2   Title  Within protocol, patient will demonstrate left knee extension/flexion active range of motion of at least 0-125 degrees to assist with more normalized gait pattern and stair ambulation.    Baseline  06/03/19: 0-134 degrees    Time  6    Period  Weeks    Status  Achieved      PT LONG TERM GOAL #3   Title  Patient will demonstrate left lower extremity strength within functional limits in order to return to daily activities safely.    Baseline  06/03/19: Limited functional strength as evidence of stairs see objective measures    Time  3    Period  Weeks    Status  On-going    Target Date  06/24/19      PT LONG TERM GOAL #4   Title  Patient will have circumferential measurement of left knee within 0.5-1 cm difference between the right knee indicating decreased edema for improved ease of mobility.    Baseline  06/03/19: See objective measures    Time  3    Period  Weeks    Status  On-going    Target Date  06/24/19      PT LONG TERM GOAL #5   Title  Patient will report ability to ambulate for at least 20 minutes without pain greater than 3 out of 10 in order to perform community activities such as grocery shopping with improved ease.    Time  6    Period  Weeks    Status  Achieved      Additional Long Term Goals   Additional Long Term Goals  Yes            Plan - 06/17/19 1054    Clinical Impression  Statement  Assessed patient's stair mobility at the start of the session, patient is able to perform reciprocal ambulation with the stairs, however, noted decreased stance time on the left lower extremity with this so continued to focus on eccentric control and strengthening this session. Added rockerboard this this session laterally for muscular control. Patient without an increase in pain throughout this.    Personal Factors and  Comorbidities  Other   GAD   Examination-Activity Limitations  Bathing;Stand;Bed Mobility;Locomotion Level;Toileting;Bend;Transfers;Sit;Squat;Dressing;Hygiene/Grooming;Stairs    Examination-Participation Restrictions  Cleaning;Meal Prep;Community Activity;Driving;Shop;Laundry    Stability/Clinical Decision Making  Stable/Uncomplicated    Rehab Potential  Fair    PT Frequency  2x / week    PT Duration  3 weeks    PT Treatment/Interventions  ADLs/Self Care Home Management;Cryotherapy;Electrical Stimulation;Moist Heat;DME Instruction;Gait training;Stair training;Functional mobility training;Therapeutic activities;Therapeutic exercise;Balance training;Neuromuscular re-education;Patient/family education;Manual techniques;Passive range of motion;Dry needling;Energy conservation;Taping;Orthotic Fit/Training    PT Next Visit Plan  Continue to focus on stair negotiation and bilateral LE strengthening    PT Home Exercise Plan  Continue exercises provided at hospital, ICE/elevate and ankle pumps  12/21:  QS, ham set, SL hip Add 04/13/19 Heel raises 3x10; 05/13/19: STS 3x10 daily; 05/20/19: Isometric abdominal exercise into physioball 5'' holds x15 2-3 sets daily    Consulted and Agree with Plan of Care  Patient       Patient will benefit from skilled therapeutic intervention in order to improve the following deficits and impairments:  Abnormal gait, Impaired sensation, Improper body mechanics, Pain, Decreased mobility, Decreased activity tolerance, Decreased endurance, Decreased range of motion, Decreased strength, Hypomobility, Decreased balance, Difficulty walking, Increased edema  Visit Diagnosis: Acute pain of left knee  Stiffness of left knee, not elsewhere classified  Muscle weakness (generalized)  Localized edema  Other abnormalities of gait and mobility     Problem List Patient Active Problem List   Diagnosis Date Noted  . Generalized anxiety disorder 11/09/2017  . Chronic abdominal pain  10/16/2017   Clarene Critchley PT, DPT 11:19 AM, 06/17/19 [place] [address] [place], [place], [address] Phone: [phone]   Fax:  [phone]  Name: Renee Savage MRN: [id number] Date of Birth: 05-01-2000

## 2019-06-22 ENCOUNTER — Other Ambulatory Visit: Payer: Self-pay

## 2019-06-22 ENCOUNTER — Ambulatory Visit (HOSPITAL_COMMUNITY): Payer: No Typology Code available for payment source | Admitting: Physical Therapy

## 2019-06-22 ENCOUNTER — Encounter (HOSPITAL_COMMUNITY): Payer: Self-pay | Admitting: Physical Therapy

## 2019-06-22 DIAGNOSIS — M25562 Pain in left knee: Secondary | ICD-10-CM | POA: Diagnosis not present

## 2019-06-22 DIAGNOSIS — R600 Localized edema: Secondary | ICD-10-CM

## 2019-06-22 DIAGNOSIS — M6281 Muscle weakness (generalized): Secondary | ICD-10-CM

## 2019-06-22 DIAGNOSIS — R2689 Other abnormalities of gait and mobility: Secondary | ICD-10-CM

## 2019-06-22 DIAGNOSIS — M25662 Stiffness of left knee, not elsewhere classified: Secondary | ICD-10-CM

## 2019-06-22 NOTE — Therapy (Signed)
[facility] [address] [place], [place], [address] Phone: [phone]   Fax:  [phone]  Physical Therapy Treatment  Patient Details  Name: Renee Savage MRN: [id number] Date of Birth: 08-04-2000 Referring Provider (PT): Duwayne Heck, MD   Encounter Date: 06/22/2019  PT End of Session - 06/22/19 0951    Visit Number  24    Number of Visits  26    Date for PT Re-Evaluation  06/24/19    Authorization Type  Generic commercial (90 visit limit combined PT/OT/ST)    Authorization Time Period  06/03/19- 06/24/19    Authorization - Visit Number  4    Authorization - Number of Visits  10    PT Start Time  0947    PT Stop Time  1025    PT Time Calculation (min)  38 min    Activity Tolerance  Patient tolerated treatment well    Behavior During Therapy  [facility] for tasks assessed/performed       Past Medical History:  Diagnosis Date  . Allergy    Chronic  . Angio-edema   . Anxiety   . Asthma   . Beta thalassemia (HCC)   . Eczema   . IBS (irritable bowel syndrome)   . Reflux   . Urticaria     Past Surgical History:  Procedure Laterality Date  . CYST REMOVAL HAND    . WISDOM TOOTH EXTRACTION  2016    There were no vitals filed for this visit.  Subjective Assessment - 06/22/19 0950    Subjective  Patient reporting having over stretched her knee in her sleep and having low level of pain currently.    Pertinent History  History of 5 years of dislocation prior to surgery    Limitations  Walking;Standing    How long can you sit comfortably?  Varies    How long can you stand comfortably?  5 minutes    How long can you walk comfortably?  5 minutes with crutches    Patient Stated Goals  To walk better and be able to bend knee    Currently in Pain?  Yes    Pain Score  1     Pain Location  Knee    Pain Orientation  Left    Pain Descriptors / Indicators  Sharp                       [facility] Adult PT Treatment/Exercise  - 06/22/19 0001      Ambulation/Gait   Ambulation/Gait  Yes    Stairs  Yes    Stair Management Technique  One rail Right    Number of Stairs  4    Height of Stairs  7    Gait Comments  Reciprocal pattern but noticable decreased stance time on the descent on the LLE   3 repetitions     Knee/Hip Exercises: Stretches   Passive Hamstring Stretch  Both;2 reps;30 seconds    Passive Hamstring Stretch Limitations  8" step    Gastroc Stretch  Both;30 seconds;3 reps    Gastroc Stretch Limitations  on slant board      Knee/Hip Exercises: Machines for Strengthening   Total Gym Leg Press  2x10, 40# DBL leg for the first set and 30# for the second set, due to difficulty      Knee/Hip Exercises: Standing   Heel Raises  20 reps    Heel Raises Limitations  Squat  to heel raise     Other Standing Knee Exercises  Ambulation on foam beam x 2 trips with minimal UE assist. wall sits, 5x5 sec hold    Other Standing Knee Exercises  Resisted backwards walking 40# x 10 slight bend in the knees. Rockerboard 2x1 minute laterally for muscular control               PT Short Term Goals - 04/27/19 0954      PT SHORT TERM GOAL #1   Title  Patient will report understanding and regular compliance with HEP to improve ROM, strength and overall mobility.    Baseline  1/20 - doing HEP daily either 1x or 2x/day    Time  3    Period  Weeks    Status  Achieved    Target Date  04/14/19      PT SHORT TERM GOAL #2   Title  Within protocol, patient will demonstrate left knee extension/flexion active range of motion of at least 0-90 degrees to assist with more normalized gait pattern and stair ambulation.    Baseline  1/20 0-106    Time  3    Period  Weeks    Status  Achieved    Target Date  04/14/19        PT Long Term Goals - 06/03/19 1055      PT LONG TERM GOAL #1   Title  Patient will demonstrate ability to ambulate at a gait velocity of at least 1.0 m/s with LRAD in order to improve safety with  community ambulation.    Baseline  06/03/19: 1.05 m/s on no AD    Time  6    Period  Weeks    Status  Achieved      PT LONG TERM GOAL #2   Title  Within protocol, patient will demonstrate left knee extension/flexion active range of motion of at least 0-125 degrees to assist with more normalized gait pattern and stair ambulation.    Baseline  06/03/19: 0-134 degrees    Time  6    Period  Weeks    Status  Achieved      PT LONG TERM GOAL #3   Title  Patient will demonstrate left lower extremity strength within functional limits in order to return to daily activities safely.    Baseline  06/03/19: Limited functional strength as evidence of stairs see objective measures    Time  3    Period  Weeks    Status  On-going    Target Date  06/24/19      PT LONG TERM GOAL #4   Title  Patient will have circumferential measurement of left knee within 0.5-1 cm difference between the right knee indicating decreased edema for improved ease of mobility.    Baseline  06/03/19: See objective measures    Time  3    Period  Weeks    Status  On-going    Target Date  06/24/19      PT LONG TERM GOAL #5   Title  Patient will report ability to ambulate for at least 20 minutes without pain greater than 3 out of 10 in order to perform community activities such as grocery shopping with improved ease.    Time  6    Period  Weeks    Status  Achieved      Additional Long Term Goals   Additional Long Term Goals  Yes  Plan - 06/22/19 1030    Clinical Impression Statement  Continued progressing patient with functional strengthening and focusing on eccentric control to improve stair negotiation. Increased repetitions of stair negotiation this session with some noted improvement on the third round compared to the first round. Increased weight on the first round of leg press, but decreased for second round due to reported difficulty by patient. Added foam beam this session to challenge balance and  muscular stability. Plan to re-assess the patient next session.    Personal Factors and Comorbidities  Other   GAD   Examination-Activity Limitations  Bathing;Stand;Bed Mobility;Locomotion Level;Toileting;Bend;Transfers;Sit;Squat;Dressing;Hygiene/Grooming;Stairs    Examination-Participation Restrictions  Cleaning;Meal Prep;Community Activity;Driving;Shop;Laundry    Stability/Clinical Decision Making  Stable/Uncomplicated    Rehab Potential  Fair    PT Frequency  2x / week    PT Duration  3 weeks    PT Treatment/Interventions  ADLs/Self Care Home Management;Cryotherapy;Electrical Stimulation;Moist Heat;DME Instruction;Gait training;Stair training;Functional mobility training;Therapeutic activities;Therapeutic exercise;Balance training;Neuromuscular re-education;Patient/family education;Manual techniques;Passive range of motion;Dry needling;Energy conservation;Taping;Orthotic Fit/Training    PT Next Visit Plan  Re-assess    PT Home Exercise Plan  Continue exercises provided at hospital, ICE/elevate and ankle pumps  12/21:  QS, ham set, SL hip Add 04/13/19 Heel raises 3x10; 05/13/19: STS 3x10 daily; 05/20/19: Isometric abdominal exercise into physioball 5'' holds x15 2-3 sets daily    Consulted and Agree with Plan of Care  Patient       Patient will benefit from skilled therapeutic intervention in order to improve the following deficits and impairments:  Abnormal gait, Impaired sensation, Improper body mechanics, Pain, Decreased mobility, Decreased activity tolerance, Decreased endurance, Decreased range of motion, Decreased strength, Hypomobility, Decreased balance, Difficulty walking, Increased edema  Visit Diagnosis: Acute pain of left knee  Stiffness of left knee, not elsewhere classified  Muscle weakness (generalized)  Localized edema  Other abnormalities of gait and mobility     Problem List Patient Active Problem List   Diagnosis Date Noted  . Generalized anxiety disorder 11/09/2017   . Chronic abdominal pain 10/16/2017   Clarene Critchley PT, DPT 10:31 AM, 06/22/19 [place], [place], [address] Phone: [phone]   Fax:  [phone]  Name: Renee Savage MRN: [id number] Date of Birth: 07/21/2000

## 2019-06-24 ENCOUNTER — Ambulatory Visit (HOSPITAL_COMMUNITY): Payer: No Typology Code available for payment source | Admitting: Physical Therapy

## 2019-06-24 ENCOUNTER — Other Ambulatory Visit: Payer: Self-pay

## 2019-06-24 ENCOUNTER — Encounter (HOSPITAL_COMMUNITY): Payer: Self-pay | Admitting: Physical Therapy

## 2019-06-24 DIAGNOSIS — M25562 Pain in left knee: Secondary | ICD-10-CM

## 2019-06-24 DIAGNOSIS — R600 Localized edema: Secondary | ICD-10-CM

## 2019-06-24 DIAGNOSIS — R2689 Other abnormalities of gait and mobility: Secondary | ICD-10-CM

## 2019-06-24 DIAGNOSIS — M6281 Muscle weakness (generalized): Secondary | ICD-10-CM

## 2019-06-24 DIAGNOSIS — M25662 Stiffness of left knee, not elsewhere classified: Secondary | ICD-10-CM

## 2019-06-24 NOTE — Therapy (Signed)
[place], [place], [address] Phone: [phone]   Fax:  [phone]  Physical Therapy Treatment / Discharge Summary  Patient Details  Name: Renee Savage MRN: [id number] Date of Birth: Jul 19, 2000 Referring Provider (PT): Victorino December, MD   Encounter Date: 06/24/2019   PHYSICAL THERAPY DISCHARGE SUMMARY  Visits from Start of Care: 25  Current functional level related to goals / functional outcomes: See below  Remaining deficits: See below   Education / Equipment: HEP, scar massage, see below for more detail Plan: Patient agrees to discharge.  Patient goals were partially met. Patient is being discharged due to                                                     ?????    Meeting the majority of her goals and being pleased with current functional status   PT End of Session - 06/24/19 1036    Visit Number  25    Number of Visits  26    Date for PT Re-Evaluation  06/24/19    Authorization Type  Generic commercial (90 visit limit combined PT/OT/ST)    Authorization Time Period  06/03/19- 06/24/19    Authorization - Visit Number  5    Authorization - Number of Visits  10    PT Start Time  0951    PT Stop Time  1015    PT Time Calculation (min)  24 min    Activity Tolerance  Patient tolerated treatment well    Behavior During Therapy  WFL for tasks assessed/performed       Past Medical History:  Diagnosis Date  . Allergy    Chronic  . Angio-edema   . Anxiety   . Asthma   . Beta thalassemia ([place])   . Eczema   . IBS (irritable bowel syndrome)   . Reflux   . Urticaria     Past Surgical History:  Procedure Laterality Date  . CYST REMOVAL HAND    . WISDOM TOOTH EXTRACTION  2016    There were no vitals filed for this visit.  Subjective Assessment - 06/24/19 0954    Subjective  Patient reported having no knee pain currently. Reported feeling ready to continue exercises independently at home.     Pertinent History  History of 5 years of dislocation prior to surgery    Limitations  Walking;Standing    How long can you sit comfortably?  Varies    How long can you stand comfortably?  5 minutes    How long can you walk comfortably?  5 minutes with crutches    Patient Stated Goals  To walk better and be able to bend knee    Currently in Pain?  No/denies         [facility] PT Assessment - 06/24/19 0001      Assessment   Medical Diagnosis  S/P Lt arthroscopic medial patellofemoral ligament reconstruction with hamstring allograft    Referring Provider (PT)  Victorino December, MD    Onset Date/Surgical Date  03/16/19      Prior Function   Level of Independence  Independent      Cognition   Overall Cognitive Status  Within Functional Limits for tasks assessed      Circumferential Edema   Circumferential -  Right  37 cm at joint line knee   was 37   Circumferential - Left   39 cm at joint line knee   was 39     AROM   Left Knee Extension  0   was 0   Left Knee Flexion  138   was 134     Strength   Right Hip Flexion  5/5    Right Hip Extension  4+/5   was 4+   Right Hip ABduction  5/5   was 4+   Left Hip Flexion  5/5    Left Hip Extension  4+/5   was 4+   Left Hip ABduction  5/5   was 4+   Right Knee Flexion  5/5    Right Knee Extension  5/5    Left Knee Flexion  5/5    Left Knee Extension  5/5    Right Ankle Dorsiflexion  5/5    Left Ankle Dorsiflexion  5/5      Ambulation/Gait   Ambulation/Gait  Yes    Ambulation/Gait Assistance  7: Independent    Ambulation Distance (Feet)  440 Feet   2MWT   Assistive device  None    Gait Pattern  Within Functional Limits    Ambulation Surface  Level;Indoor    Gait velocity  1.14 m/s    Stairs  Yes    Stair Management Technique  No rails    Number of Stairs  4    Height of Stairs  7    Gait Comments  Reciprocal pattern, equal stance time      Balance   Balance Assessed  --   1 minute each LE                           PT Education - 06/24/19 1035    Education Details  Discussed re-assessment findings, scar massage technique, updated HEP, and following up with MD as needed.    Person(s) Educated  Patient    Methods  Explanation;Handout    Comprehension  Verbalized understanding       PT Short Term Goals - 04/27/19 0954      PT SHORT TERM GOAL #1   Title  Patient will report understanding and regular compliance with HEP to improve ROM, strength and overall mobility.    Baseline  1/20 - doing HEP daily either 1x or 2x/day    Time  3    Period  Weeks    Status  Achieved    Target Date  04/14/19      PT SHORT TERM GOAL #2   Title  Within protocol, patient will demonstrate left knee extension/flexion active range of motion of at least 0-90 degrees to assist with more normalized gait pattern and stair ambulation.    Baseline  1/20 0-106    Time  3    Period  Weeks    Status  Achieved    Target Date  04/14/19        PT Long Term Goals - 06/24/19 1002      PT LONG TERM GOAL #1   Title  Patient will demonstrate ability to ambulate at a gait velocity of at least 1.0 m/s with LRAD in order to improve safety with community ambulation.    Baseline  06/03/19: 1.05 m/s on 2MWT no AD    Time  6    Period  Weeks    Status  Achieved  PT LONG TERM GOAL #2   Title  Within protocol, patient will demonstrate left knee extension/flexion active range of motion of at least 0-125 degrees to assist with more normalized gait pattern and stair ambulation.    Baseline  06/03/19: 0-134 degrees    Time  6    Period  Weeks    Status  Achieved      PT LONG TERM GOAL #3   Title  Patient will demonstrate left lower extremity strength within functional limits in order to return to daily activities safely.    Baseline  06/24/19: Good functional strength with stairs, reciprocal pattern and equal stance time    Time  3    Period  Weeks    Status  Achieved      PT LONG TERM  GOAL #4   Title  Patient will have circumferential measurement of left knee within 0.5-1 cm difference between the right knee indicating decreased edema for improved ease of mobility.    Baseline  06/03/19: See objective measures    Time  3    Period  Weeks    Status  On-going      PT LONG TERM GOAL #5   Title  Patient will report ability to ambulate for at least 20 minutes without pain greater than 3 out of 10 in order to perform community activities such as grocery shopping with improved ease.    Time  6    Period  Weeks    Status  Achieved            Plan - 06/24/19 1104    Clinical Impression Statement  Performed re-assessment of patient's progress towards goals. Patient achieved all short term goals. Patient achieved 4 out of 5 goals. Patient did continue to demonstrate increased swelling in the left lower extremity. Since last re-assessment patient improved gait speed, and demonstrated improved stair negotiation with equal stance time on both lower extremities. Educated patient on continuing HEP as well as additional exercises to perform at home. Also educated the patient on how to perform a scar massage at home. Patient is being discharged at this time as she has met the majority of her goals and is pleased with current functional status.    Personal Factors and Comorbidities  Other   GAD   Examination-Activity Limitations  Bathing;Stand;Bed Mobility;Locomotion Level;Toileting;Bend;Transfers;Sit;Squat;Dressing;Hygiene/Grooming;Stairs    Examination-Participation Restrictions  Cleaning;Meal Prep;Community Activity;Driving;Shop;Laundry    Stability/Clinical Decision Making  Stable/Uncomplicated    Rehab Potential  Fair    PT Frequency  2x / week    PT Duration  3 weeks    PT Treatment/Interventions  ADLs/Self Care Home Management;Cryotherapy;Electrical Stimulation;Moist Heat;DME Instruction;Gait training;Stair training;Functional mobility training;Therapeutic activities;Therapeutic  exercise;Balance training;Neuromuscular re-education;Patient/family education;Manual techniques;Passive range of motion;Dry needling;Energy conservation;Taping;Orthotic Fit/Training    PT Next Visit Plan  Discharged    PT Home Exercise Plan  Continue exercises provided at hospital, ICE/elevate and ankle pumps  12/21:  QS, ham set, SL hip Add 04/13/19 Heel raises 3x10; 05/13/19: STS 3x10 daily; 05/20/19: Isometric abdominal exercise into physioball 5'' holds x15 2-3 sets daily; 06/24/19: Squat to heel raise, sidestepping with RTB    Consulted and Agree with Plan of Care  Patient       Patient will benefit from skilled therapeutic intervention in order to improve the following deficits and impairments:  Abnormal gait, Impaired sensation, Improper body mechanics, Pain, Decreased mobility, Decreased activity tolerance, Decreased endurance, Decreased range of motion, Decreased strength, Hypomobility, Decreased balance, Difficulty walking, Increased edema  Visit Diagnosis: Acute pain of left knee  Stiffness of left knee, not elsewhere classified  Muscle weakness (generalized)  Localized edema  Other abnormalities of gait and mobility     Problem List Patient Active Problem List   Diagnosis Date Noted  . Generalized anxiety disorder 11/09/2017  . Chronic abdominal pain 10/16/2017   Clarene Critchley PT, DPT 11:07 AM, 06/24/19 [place], [place], [address] Phone: [phone]   Fax:  [phone]  Name: Renee Savage MRN: [id number] Date of Birth: 2001/02/07

## 2019-06-24 NOTE — Patient Instructions (Signed)
Medbridge access code: [email or web address]

## 2019-07-05 ENCOUNTER — Ambulatory Visit (INDEPENDENT_AMBULATORY_CARE_PROVIDER_SITE_OTHER): Payer: Managed Care, Other (non HMO) | Admitting: Family Medicine

## 2019-07-05 ENCOUNTER — Other Ambulatory Visit: Payer: Self-pay

## 2019-07-05 DIAGNOSIS — R5383 Other fatigue: Secondary | ICD-10-CM

## 2019-07-05 NOTE — Progress Notes (Signed)
   Subjective:  Audio plus video  Patient ID: Renee Savage, female    DOB: 18-Nov-2000, 19 y.o.   MRN: [id number]  HPIfatigue and sleeping a lot. Falling asleep while driving.  Started in December after knee surgery.   Virtual Visit via Video Note  I connected with Renee Savage on 07/05/19 at  1:40 PM EDT by a video enabled telemedicine application and verified that I am speaking with the correct person using two identifiers.  Location: Patient: home Provider: office   I discussed the limitations of evaluation and management by telemedicine and the availability of in person appointments. The patient expressed understanding and agreed to proceed.  History of Present Illness:    Observations/Objective:   Assessment and Plan:   Follow Up Instructions:    I discussed the assessment and treatment plan with the patient. The patient was provided an opportunity to ask questions and all were answered. The patient agreed with the plan and demonstrated an understanding of the instructions.   The patient was advised to call back or seek an in-person evaluation if the symptoms worsen or if the condition fails to improve as anticipated.  I provided 22 minutes of non-face-to-face time during this encounter.   Patient notes very substantial ongoing fatigue.  Day and evening.  Drowsy often.  Napping frequently during the day.  Sometimes long naps.  States that has been going on since her knee surgery  Of importance, patient has been sleeping on her couch since then.  And she is forced to keep the scheduled others to often go by the couch at 11:30 at night and then again at 6 AM in the morning.  In the past in her own room she had slept soundly throughout the night with helped interruption     Review of Systems See above    Objective:   Physical Exam   Virtual     Assessment & Plan:  Impression fatigue associated with drowsiness/long nap/sleepiness while driving.   Proper sleep hygiene discussed.  Patient needs to get back in her room for a better night sleep.  Exercise encouraged.  Appropriate screening blood work toward for organic causes of fatigue.

## 2019-07-15 LAB — CBC WITH DIFFERENTIAL/PLATELET
Basophils Absolute: 0 10*3/uL (ref 0.0–0.2)
Basos: 1 %
EOS (ABSOLUTE): 0.2 10*3/uL (ref 0.0–0.4)
Eos: 4 %
Hematocrit: 39.1 % (ref 34.0–46.6)
Hemoglobin: 12.1 g/dL (ref 11.1–15.9)
Immature Grans (Abs): 0 10*3/uL (ref 0.0–0.1)
Immature Granulocytes: 0 %
Lymphocytes Absolute: 2.1 10*3/uL (ref 0.7–3.1)
Lymphs: 39 %
MCH: 27.8 pg (ref 26.6–33.0)
MCHC: 30.9 g/dL — ABNORMAL LOW (ref 31.5–35.7)
MCV: 90 fL (ref 79–97)
Monocytes Absolute: 0.3 10*3/uL (ref 0.1–0.9)
Monocytes: 6 %
Neutrophils Absolute: 2.7 10*3/uL (ref 1.4–7.0)
Neutrophils: 50 %
Platelets: 191 10*3/uL (ref 150–450)
RBC: 4.35 x10E6/uL (ref 3.77–5.28)
RDW: 14 % (ref 11.7–15.4)
WBC: 5.3 10*3/uL (ref 3.4–10.8)

## 2019-07-15 LAB — BASIC METABOLIC PANEL
BUN/Creatinine Ratio: 14 (ref 9–23)
BUN: 9 mg/dL (ref 6–20)
CO2: 21 mmol/L (ref 20–29)
Calcium: 9.6 mg/dL (ref 8.7–10.2)
Chloride: 102 mmol/L (ref 96–106)
Creatinine, Ser: 0.66 mg/dL (ref 0.57–1.00)
GFR calc Af Amer: 146 mL/min/{1.73_m2} (ref >59)
GFR calc non Af Amer: 127 mL/min/{1.73_m2} (ref >59)
Glucose: 82 mg/dL (ref 65–99)
Potassium: 4.4 mmol/L (ref 3.5–5.2)
Sodium: 140 mmol/L (ref 134–144)

## 2019-07-15 LAB — HEPATIC FUNCTION PANEL
ALT: 49 IU/L — ABNORMAL HIGH (ref 0–32)
AST: 46 IU/L — ABNORMAL HIGH (ref 0–40)
Albumin: 4.4 g/dL (ref 3.9–5.0)
Alkaline Phosphatase: 80 IU/L (ref 39–117)
Bilirubin Total: 0.2 mg/dL (ref 0.0–1.2)
Bilirubin, Direct: 0.08 mg/dL (ref 0.00–0.40)
Total Protein: 6.9 g/dL (ref 6.0–8.5)

## 2019-07-15 LAB — TSH: TSH: 1.49 u[IU]/mL (ref 0.450–4.500)

## 2019-07-15 LAB — T4, FREE: Free T4: 0.93 ng/dL (ref 0.82–1.77)

## 2019-07-19 ENCOUNTER — Other Ambulatory Visit: Payer: Self-pay | Admitting: *Deleted

## 2019-07-19 DIAGNOSIS — R5383 Other fatigue: Secondary | ICD-10-CM

## 2019-07-19 DIAGNOSIS — R748 Abnormal levels of other serum enzymes: Secondary | ICD-10-CM

## 2019-07-19 NOTE — Progress Notes (Signed)
Patient notified of results. Labs were ordered.

## 2019-07-21 LAB — IRON AND TIBC
Iron Saturation: 22 % (ref 15–55)
Iron: 94 ug/dL (ref 27–159)
Total Iron Binding Capacity: 434 ug/dL (ref 250–450)
UIBC: 340 ug/dL (ref 131–425)

## 2019-07-21 LAB — FERRITIN: Ferritin: 63 ng/mL (ref 15–150)

## 2019-07-21 LAB — HEPATITIS B SURFACE ANTIGEN: Hepatitis B Surface Ag: NEGATIVE

## 2019-07-21 LAB — HEPATITIS C ANTIBODY: Hep C Virus Ab: <0.1 s/co ratio (ref 0.0–0.9)

## 2019-07-25 ENCOUNTER — Telehealth: Payer: Self-pay | Admitting: Family Medicine

## 2019-07-25 NOTE — Telephone Encounter (Signed)
Pt contacted and verbalized understanding.  

## 2019-07-25 NOTE — Telephone Encounter (Signed)
Actually, no, I am not stating must come off, this will be decision between her and her mental health provider, many necessary meds can cause slight elevation of liver inzymes like this, ok to wait til 4 30

## 2019-07-25 NOTE — Telephone Encounter (Signed)
Pt calling in to office regarding Lexapro. Pt was spoken with earlier this morning by another nurse regarding lab results. Pt states the provider is wanting her to come off of Lexapro. Pt contacted behavior health and is not able to see them until 08/05/19. Pt is wanting suggestions on what she should do. Please advise. Thank you

## 2019-08-04 ENCOUNTER — Telehealth (INDEPENDENT_AMBULATORY_CARE_PROVIDER_SITE_OTHER): Payer: PRIVATE HEALTH INSURANCE | Admitting: Psychiatry

## 2019-08-04 ENCOUNTER — Other Ambulatory Visit: Payer: Self-pay

## 2019-08-04 ENCOUNTER — Encounter (HOSPITAL_COMMUNITY): Payer: Self-pay | Admitting: Psychiatry

## 2019-08-04 DIAGNOSIS — F411 Generalized anxiety disorder: Secondary | ICD-10-CM

## 2019-08-04 MED ORDER — ESCITALOPRAM OXALATE 20 MG PO TABS: 30.0000 mg | ORAL_TABLET | Freq: Every day | ORAL | 3 refills | Status: DC

## 2019-08-04 MED ORDER — HYDROXYZINE HCL 25 MG PO TABS: 25.0000 mg | ORAL_TABLET | Freq: Every day | ORAL | 2 refills | Status: DC | PRN

## 2019-08-04 NOTE — Progress Notes (Addendum)
Virtual Visit via Video Note  I connected with Renee Savage on 08/04/19 at  1:20 PM EDT by a video enabled telemedicine application and verified that I am speaking with the correct person using two identifiers.   I discussed the limitations of evaluation and management by telemedicine and the availability of in person appointments. The patient expressed understanding and agreed to proceed   I discussed the assessment and treatment plan with the patient. The patient was provided an opportunity to ask questions and all were answered. The patient agreed with the plan and demonstrated an understanding of the instructions.   The patient was advised to call back or seek an in-person evaluation if the symptoms worsen or if the condition fails to improve as anticipated.  I provided 15 minutes of non-face-to-face time during this encounter. Location, provider office, patient home  Renee Ruder, MD  [facility] MD/PA/NP OP Progress Note  08/04/2019 1:53 PM Renee Savage  MRN:  [id number]  Chief Complaint:  Chief Complaint    Depression; Anxiety; Follow-up     HPI: This patient is a19 year old single white female who lives at home with her parents and 2 year old brother. 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  The patient was referred by her primary physician, Dr. Lilyan Savage for further assessment and treatment of anxiety.  The patient states that she has been anxious for a number of years. Some of this started in middle school because she was being bullied. She also has a very contentious relationship with her father. She states that he tries to control everyone in the family but particularly her. They are always butting heads. She states that he comes from a "military family "and tends to be harsh. He is very critical of her appearance, her relationships, her major in school. He is also constantly criticizing little things like the timing of when she pays her tuition, how she  does things around the house etc. By her report he is very harsh and judgmental. He pushed her constantly to go into the same feel that he is-speech therapy even though she does not want to. He is very against her living on campus because of the cost.  The patient states over the last few months she has been extremely irritable, crying at the drop of a hat anxious and worried. She tends to obsess about things and expect the worst. She is very self-critical. She was dating a boy last fall and he broke up with her in December and this caused some of the symptoms to intensify. She has never had previous psychiatric treatment because her father does not believe in mental health treatment. She does have a lot of GI issues such as stomachaches and diarrhea which Dr. Gerda Savage thinks are related to stress. She is not able to sleep well and is constantly wakes up through the night. She has had some panic attacks particularly during arguments with dad. She denies being suicidal or engaging in any form of self-harm. She does not use alcohol drugs cigarettes or vaping. She is never been sexually active. She and her mother are very close and she is also close to her brother. She seems extremely managed in the family, trying to protect her brother from the father and also trying to make up for her mother's chronic headaches by being a "second mom"to brother who picks him up at school etc  The patient returns for follow-up after long absence she was last seen about 8 months  ago.  Since then she has had knee surgery last December and has been going through a lot of physical therapy.  She states that she felt very fatigued and her primary doctor did a lot of tests and her ALT and AST are only slightly elevated which she thought might be due to Lexapro.  I suggest that we cut it back a bit given that it may be causing some of the fatigue and she also notes weight gain.  She has been more depressed to a degree but  mostly because her father sets and possible standards for her to meet by her report.  She claims that he expects her to get straight A's in college and even when she does do very well then he blames her brother for not doing as well as she does.  It sounds like a no win situation.  I urged her to set her own standards.  I also suggested she get back into counseling and she agrees.  She denies of suicide or thoughts of self-harm but does admit to more anxiety so I suggested that we add a low-dose of hydroxyzine as needed.  Visit Diagnosis:    ICD-10-CM   1. Generalized anxiety disorder  F41.1     Past Psychiatric History: none  Past Medical History:  Past Medical History:  Diagnosis Date  . Allergy    Chronic  . Angio-edema   . Anxiety   . Asthma   . Beta thalassemia (HCC)   . Eczema   . IBS (irritable bowel syndrome)   . Reflux   . Urticaria     Past Surgical History:  Procedure Laterality Date  . CYST REMOVAL HAND    . WISDOM TOOTH EXTRACTION  2016    Family Psychiatric History: see below  Family History:  Family History  Problem Relation Age of Onset  . Anxiety disorder Mother   . Depression Mother   . Depression Father   . Anxiety disorder Brother   . Depression Brother   . Depression Paternal Aunt   . Bipolar disorder Other     Social History:  Social History   Socioeconomic History  . Marital status: Single    Spouse name: Not on file  . Number of children: Not on file  . Years of education: Not on file  . Highest education level: Not on file  Occupational History  . Not on file  Tobacco Use  . Smoking status: Never Smoker  . Smokeless tobacco: Never Used  Substance and Sexual Activity  . Alcohol use: No  . Drug use: No  . Sexual activity: Never  Other Topics Concern  . Not on file  Social History Narrative  . Not on file   Social Determinants of Health   Financial Resource Strain:   . Difficulty of Paying Living Expenses:   Food Insecurity:    . Worried About Programme researcher, broadcasting/film/video in the Last Year:   . Barista in the Last Year:   Transportation Needs:   . Freight forwarder (Medical):   Marland Kitchen Lack of Transportation (Non-Medical):   Physical Activity:   . Days of Exercise per Week:   . Minutes of Exercise per Session:   Stress:   . Feeling of Stress :   Social Connections:   . Frequency of Communication with Friends and Family:   . Frequency of Social Gatherings with Friends and Family:   . Attends Religious Services:   . Active Member  of Clubs or Organizations:   . Attends Banker Meetings:   Marland Kitchen Marital Status:     Allergies:  Allergies  Allergen Reactions  . Amoxil [Amoxicillin] Nausea And Vomiting    Dizziness, light headed, loopy  . Cefzil [Cefprozil] Nausea And Vomiting    Vomiting (as an baby)  . Orange Oil Hives    Metabolic Disorder Labs: No results found for: HGBA1C, MPG No results found for: PROLACTIN No results found for: CHOL, TRIG, HDL, CHOLHDL, VLDL, LDLCALC Lab Results  Component Value Date   TSH 1.490 07/14/2019    Therapeutic Level Labs: No results found for: LITHIUM No results found for: VALPROATE No components found for:  CBMZ  Current Medications: Current Outpatient Medications  Medication Sig Dispense Refill  . albuterol (PROVENTIL HFA;VENTOLIN HFA) 108 (90 Base) MCG/ACT inhaler Inhale 2 puffs into the lungs every 6 (six) hours as needed for wheezing. 1 Inhaler 2  . escitalopram (LEXAPRO) 20 MG tablet Take 1.5 tablets (30 mg total) by mouth daily. 180 tablet 3  . hydrOXYzine (ATARAX/VISTARIL) 25 MG tablet Take 1 tablet (25 mg total) by mouth daily as needed for anxiety. 90 tablet 2  . LO LOESTRIN FE 1 MG-10 MCG / 10 MCG tablet TAKE 1 TABLET BY MOUTH  DAILY 84 tablet 3  . Multiple Vitamin (MULTIVITAMIN) tablet Take 1 tablet by mouth at bedtime.     . ondansetron (ZOFRAN ODT) 4 MG disintegrating tablet Take 1 tablet (4 mg total) by mouth every 6 (six) hours as needed  for nausea or vomiting. 20 tablet 0   No current facility-administered medications for this visit.     Musculoskeletal: Strength & Muscle Tone: within normal limits Gait & Station: normal Patient leans: N/A  Psychiatric Specialty Exam: Review of Systems  Constitutional: Positive for fatigue.  Psychiatric/Behavioral: The patient is nervous/anxious.   All other systems reviewed and are negative.   There were no vitals taken for this visit.There is no height or weight on file to calculate BMI.  General Appearance: Casual and Fairly Groomed  Eye Contact:  Good  Speech:  Clear and Coherent  Volume:  Normal  Mood:  Anxious and Dysphoric  Affect:  Constricted and Tearful  Thought Process:  Goal Directed  Orientation:  Full (Time, Place, and Person)  Thought Content: Rumination   Suicidal Thoughts:  No  Homicidal Thoughts:  No  Memory:  Immediate;   Good Recent;   Good Remote;   Good  Judgement:  Fair  Insight:  Fair  Psychomotor Activity:  Decreased  Concentration:  Concentration: Good and Attention Span: Good  Recall:  Good  Fund of Knowledge: Good  Language: Good  Akathisia:  No  Handed:  Right  AIMS (if indicated): not done  Assets:  Communication Skills Desire for Improvement Physical Health Resilience Social Support Talents/Skills  ADL's:  Intact  Cognition: WNL  Sleep:  Good   Screenings: GAD-7     Office Visit from 10/16/2017 in [facility]  Total GAD-7 Score  16    PHQ2-9     Office Visit from 11/05/2018 in [facility]  PHQ-2 Total Score  1  PHQ-9 Total Score  13       Assessment and Plan: This patient is a 19 year old female with a history of depression and anxiety.  She has developed very low self-esteem which she attributes to the criticism she receives from her father.  I would very much like her to get back into therapy to learn  how to cope with this in a better way.  She agrees to try.  Because of the elevated liver  enzymes we will cut Lexapro down to 30 mg daily for depression anxiety and also add hydroxyzine 25 mg daily as needed for breakthrough anxiety.  She will return to see me in 6 weeks   Levonne Spiller, MD 08/04/2019, 1:53 PM

## 2019-08-05 ENCOUNTER — Ambulatory Visit (HOSPITAL_COMMUNITY): Payer: PRIVATE HEALTH INSURANCE | Admitting: Psychiatry

## 2019-08-11 ENCOUNTER — Other Ambulatory Visit (HOSPITAL_COMMUNITY)
Admission: RE | Admit: 2019-08-11 | Discharge: 2019-08-11 | Disposition: A | Discharge status: Home / Self Care | Payer: No Typology Code available for payment source | Source: Ambulatory Visit | Attending: Adult Health | Admitting: Adult Health

## 2019-08-11 ENCOUNTER — Encounter: Payer: Self-pay | Admitting: Adult Health

## 2019-08-11 ENCOUNTER — Ambulatory Visit (INDEPENDENT_AMBULATORY_CARE_PROVIDER_SITE_OTHER): Payer: PRIVATE HEALTH INSURANCE | Admitting: Adult Health

## 2019-08-11 ENCOUNTER — Other Ambulatory Visit: Payer: Self-pay

## 2019-08-11 VITALS — BP 117/75 | HR 88 | Ht 61.0 in | Wt 149.0 lb

## 2019-08-11 DIAGNOSIS — Z1272 Encounter for screening for malignant neoplasm of vagina: Secondary | ICD-10-CM

## 2019-08-11 DIAGNOSIS — Z3202 Encounter for pregnancy test, result negative: Secondary | ICD-10-CM | POA: Insufficient documentation

## 2019-08-11 DIAGNOSIS — Z01419 Encounter for gynecological examination (general) (routine) without abnormal findings: Secondary | ICD-10-CM | POA: Insufficient documentation

## 2019-08-11 LAB — POCT URINE PREGNANCY: Preg Test, Ur: NEGATIVE

## 2019-08-11 NOTE — Patient Instructions (Signed)

## 2019-08-11 NOTE — Progress Notes (Addendum)
Patient ID: Renee Savage, female   DOB: March 28, 2001, 19 y.o.   MRN: [id number] History of Present Illness: Renee Savage is a 19 year old white female, single,G0P0, in for a well woman gyn exam and her first pap. PCP is Renee Savage.    Current Medications, Allergies, Past Medical History, Past Surgical History, Family History and Social History were reviewed in [name].     Review of Systems: Patient denies any headaches, hearing loss, fatigue, blurred vision, shortness of breath, chest pain, abdominal pain, problems with bowel movements, urination, or intercourse(never had sex). No mood swings. Pain in left knee at times, has had surgery. Happy with Lo loestrin, no regular period  +weight gain    Physical Exam:BP 117/75 (BP Location: Left Arm, Patient Position: Sitting, Cuff Size: Normal)   Pulse 88   Ht 5\' 1"  (1.549 m)   Wt 149 lb (67.6 kg)   LMP 05/10/2019 (Exact Date)   BMI 28.15 kg/m  General:  Well developed, well nourished, no acute distress Skin:  Warm and dry Neck:  Midline trachea, normal thyroid, good ROM, no lymphadenopathy Lungs; Clear to auscultation bilaterally Breast:  No dominant palpable mass, retraction, or nipple discharge Cardiovascular: Regular rate and rhythm Abdomen:  Soft, non tender, no hepatosplenomegaly Pelvic:  External genitalia is normal in appearance, no lesions.  The vagina is normal in appearance. Urethra has no lesions or masses. The cervix is nulliparous, pap with GC/CHL and high risk HPV 16/18 genotyping performed  Uterus is felt to be normal size, shape, and contour.  No adnexal masses or tenderness noted.Bladder is non tender, no masses felt. Extremities/musculoskeletal:  No swelling or varicosities noted, no clubbing or cyanosis, has scar left knee Psych:  No mood changes, alert and cooperative,seems happy AA 0 Fall risk is low PHQ 9 score is 15, no SI, is on lexapro and vistaril, sees Renee 07/08/2019.  Impression  and Plan:  1. Encounter for gynecological examination with Papanicolaou smear of cervix Pap sent Physical in 1 year Pap in 3 if normal Labs with PCP Discussed counting calories and keep food log Try to exercise 10 minutes 3 x daily Review handout on counting calories   2. Urine pregnancy test negative

## 2019-08-15 ENCOUNTER — Telehealth: Payer: Self-pay | Admitting: Family Medicine

## 2019-08-15 ENCOUNTER — Telehealth (INDEPENDENT_AMBULATORY_CARE_PROVIDER_SITE_OTHER): Payer: No Typology Code available for payment source | Admitting: Family Medicine

## 2019-08-15 ENCOUNTER — Other Ambulatory Visit: Payer: Self-pay

## 2019-08-15 DIAGNOSIS — B349 Viral infection, unspecified: Secondary | ICD-10-CM | POA: Diagnosis not present

## 2019-08-15 NOTE — Telephone Encounter (Signed)
[name], golliday are scheduled for a virtual visit with your provider today.    Just as we do with appointments in the office, we must obtain your consent to participate.  Your consent will be active for this visit and any virtual visit you may have with one of our providers in the next 365 days.    If you have a MyChart account, I can also send a copy of this consent to you electronically.  All virtual visits are billed to your insurance company just like a traditional visit in the office.  As this is a virtual visit, video technology does not allow for your provider to perform a traditional examination.  This may limit your provider's ability to fully assess your condition.  If your provider identifies any concerns that need to be evaluated in person or the need to arrange testing such as labs, EKG, etc, we will make arrangements to do so.    Although advances in technology are sophisticated, we cannot ensure that it will always work on either your end or our end.  If the connection with a video visit is poor, we may have to switch to a telephone visit.  With either a video or telephone visit, we are not always able to ensure that we have a secure connection.   I need to obtain your verbal consent now.   Are you willing to proceed with your visit today?   Renee Savage has provided verbal consent on 08/15/2019 for a virtual visit (video or telephone).   Marlowe Shores, LPN 5/64/3329  5:18 PM

## 2019-08-15 NOTE — Progress Notes (Signed)
   Subjective:  Audio testing 123+ video  Patient ID: Renee Savage, female    DOB: March 18, 2001, 19 y.o.   MRN: [id number]  Headache  This is a new problem. The current episode started yesterday. Associated symptoms comments: Low grade fever (99.0-99.4), headache, stuffiness, abdominal pain and diarrhea. . She has tried nothing for the symptoms.  Pt informed of testing information and is going to set up appointment.   Virtual Visit via Video Note  I connected with [name] on 08/15/19 at  3:50 PM EDT by a video enabled telemedicine application and verified that I am speaking with the correct person using two identifiers.  Location: Patient: home Provider: office   I discussed the limitations of evaluation and management by telemedicine and the availability of in person appointments. The patient expressed understanding and agreed to proceed.  History of Present Illness:    Observations/Objective:   Assessment and Plan:   Follow Up Instructions:    I discussed the assessment and treatment plan with the patient. The patient was provided an opportunity to ask questions and all were answered. The patient agreed with the plan and demonstrated an understanding of the instructions.   The patient was advised to call back or seek an in-person evaluation if the symptoms worsen or if the condition fails to improve as anticipated.  I provided 20 minutes of non-face-to-face time during this encounter.        Review of Systems  Neurological: Positive for headaches.       Objective:   Physical Exam   Virtual     Assessment & Plan:  Impression viral syndrome.  Even though patient has had COVID-19 vaccine recommend testing with symptomatology.  Rationale discussed

## 2019-08-16 ENCOUNTER — Other Ambulatory Visit: Payer: PRIVATE HEALTH INSURANCE

## 2019-08-16 LAB — CYTOLOGY - PAP
Chlamydia: NEGATIVE
Comment: NEGATIVE
Comment: NEGATIVE
Comment: NORMAL
Diagnosis: NEGATIVE
High risk HPV: NEGATIVE
Neisseria Gonorrhea: NEGATIVE

## 2019-08-18 ENCOUNTER — Encounter: Payer: Self-pay | Admitting: Family Medicine

## 2019-08-22 ENCOUNTER — Telehealth: Payer: Self-pay | Admitting: Family Medicine

## 2019-08-22 MED ORDER — AZITHROMYCIN 250 MG PO TABS
ORAL_TABLET | ORAL | 0 refills | Status: DC
Start: 2019-08-22 — End: 2019-09-12

## 2019-08-22 NOTE — Telephone Encounter (Signed)
Seen last week, Covid testing came back negative.  Stomach issues have resolved but still having some Low grade fevers, 99.3, 99.7, Congestion- greenish yellow, headache and runny nose.  She wasn't sure if she needed to be seen again or if an antibiotic could be called in?  [facility]

## 2019-08-22 NOTE — Telephone Encounter (Signed)
Please advise. Thank you

## 2019-08-22 NOTE — Telephone Encounter (Signed)
Zpk, remind pt you take for fice d but lasts ten d in the blood stream

## 2019-08-22 NOTE — Telephone Encounter (Signed)
Zpack sent to pharmacy and pt is aware. Pt informed that z pack is taken for 5 days but last for 10 days in blood stream. Pt verbalized understanding.

## 2019-08-23 ENCOUNTER — Ambulatory Visit (INDEPENDENT_AMBULATORY_CARE_PROVIDER_SITE_OTHER): Payer: PRIVATE HEALTH INSURANCE | Admitting: Clinical

## 2019-08-23 ENCOUNTER — Other Ambulatory Visit: Payer: Self-pay

## 2019-08-23 DIAGNOSIS — F411 Generalized anxiety disorder: Secondary | ICD-10-CM

## 2019-08-23 DIAGNOSIS — F321 Major depressive disorder, single episode, moderate: Secondary | ICD-10-CM | POA: Diagnosis not present

## 2019-08-23 NOTE — Progress Notes (Signed)
Virtual Visit via Video Note  I connected with Renee Savage on 08/23/19 at 10:00 AM EDT by a video enabled telemedicine application and verified that I am speaking with the correct person using two identifiers.  Location: Patient: Home Provider: Office   I discussed the limitations of evaluation and management by telemedicine and the availability of in person appointments. The patient expressed understanding and agreed to proceed.     Comprehensive Clinical Assessment (CCA) Note  08/23/2019 Renee Savage [id number]  Visit Diagnosis:      ICD-10-CM   1. Generalized anxiety disorder  F41.1   2. Depression, major, single episode, moderate (HCC)  F32.1       CCA Part One  Part One has been completed on paper by the patient.  (See scanned document in Chart Review)  CCA Part Two A  Intake/Chief Complaint:  CCA Intake With Chief Complaint CCA Part Two Date: 08/23/19 CCA Part Two Time: 0819 Chief Complaint/Presenting Problem: Anxiety and Depression Patients Currently Reported Symptoms/Problems: Mood: Some difficulty with focus, overwhelmed with thoughts at times, irritable with family members , wakes up through the night, episodes of tearfulness, feels overwhelmed,  Anxiety: worried, nervous, fearful, history of panic attacks, punishes herself when she feels like she has messed up Collateral Involvement: None Individual's Strengths: Creative writing, good at teaching, mediator at home, good speaking voice Individual's Preferences: Prefer independence/freedom, Prefers to talk/work out her problems, Prefers to spend time with dogs, Doesn't prefer when family overwhelms her, Doesn't prefer when her mother guilts her, and doesnt like yelling Individual's Abilities: Writing, communication, teaching Type of Services Patient Feels Are Needed: Therapy, Medication management Initial Clinical Notes/Concerns: Symptoms started around age 30 when she was in middle school when she was  bullied, symptoms occur 4-7 days out of the week, symptoms are moderate to severe   Mental Health Symptoms Depression:  Depression: Tearfulness, Difficulty Concentrating, Sleep (too much or little), Irritability, Change in energy/activity, Fatigue, Hopelessness, Increase/decrease in appetite, Weight gain/loss  Mania:  Mania: N/A  Anxiety:   Anxiety: Difficulty concentrating, Irritability, Sleep, Worrying, Tension, Restlessness, Fatigue  Psychosis:  Psychosis: N/A  Trauma:  Trauma: N/A  Obsessions:  Obsessions: N/A  Compulsions:  Compulsions: N/A  Inattention:  Inattention: N/A  Hyperactivity/Impulsivity:  Hyperactivity/Impulsivity: N/A  Oppositional/Defiant Behaviors:  Oppositional/Defiant Behaviors: N/A  Borderline Personality:  Emotional Irregularity: N/A  Other Mood/Personality Symptoms:  Other Mood/Personality Symtpoms: None noted   Mental Status Exam Appearance and self-care  Stature:  Stature: Small  Weight:  Weight: Overweight  Clothing:  Clothing: Casual  Grooming:  Grooming: Well-groomed  Cosmetic use:  Cosmetic Use: Age appropriate  Posture/gait:  Posture/Gait: Normal  Motor activity:  Motor Activity: Not Remarkable  Sensorium  Attention:  Attention: Normal  Concentration:  Concentration: Normal  Orientation:  Orientation: X5  Recall/memory:  Recall/Memory: Normal  Affect and Mood  Affect:  Affect: Flat, Depressed  Mood:  Mood: Depressed  Relating  Eye contact:  Eye Contact: Fleeting  Facial expression:  Facial Expression: Anxious  Attitude toward examiner:  Attitude Toward Examiner: Cooperative  Thought and Language  Speech flow: Speech Flow: Pressured  Thought content:  Thought Content: Appropriate to mood and circumstances  Preoccupation:  Preoccupations: Other (Comment)(None)  Hallucinations:  Hallucinations: Other (Comment)(None)  Organization:   logical  Company secretary of Knowledge:  Fund of Knowledge: Average  Intelligence:  Intelligence:  Average  Abstraction:  Abstraction: Normal  Judgement:  Judgement: Normal  Reality Testing:  Reality Testing: Adequate  Insight:  Insight: Good  Decision Making:  Decision Making: Normal  Social Functioning  Social Maturity:  Social Maturity: Isolates  Social Judgement:  Social Judgement: Normal  Stress  Stressors:  Stressors: Family conflict, Illness(Knee surgery in the past year, Asthma.)  Coping Ability:  Coping Ability: Building surveyor Deficits:   None noted  Supports:   family   Family and Psychosocial History: Family history Marital status: Single Are you sexually active?: No What is your sexual orientation?: A sexual  Has your sexual activity been affected by drugs, alcohol, medication, or emotional stress?: None  Does patient have children?: No  Childhood History:  Childhood History By whom was/is the patient raised?: Both parents Additional childhood history information: Patient describes her childhood as good but stressful Description of patient's relationship with caregiver when they were a child: Mother: Good but overprotective,    Father: Ok  Patient's description of current relationship with people who raised him/her: The patient notes, " My reltionship is complicated and interesting, i live with my parents and my Mother has medical complications and my Father is non-emotion based and not empathetic, I have basically had a job of taking care of my Mother with her medical problems".. How were you disciplined when you got in trouble as a child/adolescent?: Spanked, things taken away  Does patient have siblings?: Yes Number of Siblings: 1 Description of patient's current relationship with siblings: The patient notes she has a younger brother. The patient notes, " I get along with my brother the best, my brother helps with taking care of my Mother". Did patient suffer any verbal/emotional/physical/sexual abuse as a child?: No Did patient suffer from severe childhood  neglect?: No Has patient ever been sexually abused/assaulted/raped as an adolescent or adult?: No Was the patient ever a victim of a crime or a disaster?: No Witnessed domestic violence?: No Has patient been effected by domestic violence as an adult?: No  CCA Part Two B  Employment/Work Situation: Employment / Work Psychologist, occupational Employment situation: Surveyor, minerals job has been impacted by current illness: No What is the longest time patient has a held a job?: N/A Where was the patient employed at that time?: N/A Did You Receive Any Psychiatric Treatment/Services While in Equities trader?: No Are There Guns or Other Weapons in Your Home?: No Are These Comptroller?: No Who Could Verify You Are Able To Have These Secured:: NA  Education: Education School Currently Attending: [employer] Last Grade Completed: 12 Name of High School: [employer]  Did Garment/textile technologist From [employer]?: Yes Did Theme park manager?: Yes What Type of College Degree Do you Have?: The patient is currently working on attaining her degree in [place] with a Minor in Creative Writing Did You Attend Graduate School?: No Did You Have Any Scientist, research (life sciences) In School?: Theater, dance, creative writing  Did You Have An Individualized Education Program (IIEP): No Did You Have Any Difficulty At School?: No  Religion: Religion/Spirituality Are You A Religious Person?: Yes What is Your Religious Affiliation?: Christian How Might This Affect Treatment?: Support in treatment  Leisure/Recreation: Leisure / Recreation Leisure and Hobbies: Writing, drawing  Exercise/Diet: Exercise/Diet Do You Exercise?: No Have You Gained or Lost A Significant Amount of Weight in the Past Six Months?: Yes-Gained Number of Pounds Gained: 35 Do You Follow a Special Diet?: No Do You Have Any Trouble Sleeping?: Yes Explanation of Sleeping Difficulties: The patient notes, " I am having difficulty with falling asleep,  staying asleep, and waking up on  time".  CCA Part Two C  Alcohol/Drug Use: Alcohol / Drug Use Pain Medications: See Patient MAR Prescriptions: See patient MAR Over the Counter: See patient MAR History of alcohol / drug use?: No history of alcohol / drug abuse Longest period of sobriety (when/how long): NA                      CCA Part Three  ASAM's:  Six Dimensions of Multidimensional Assessment  Dimension 1:  Acute Intoxication and/or Withdrawal Potential:  Dimension 1:  Comments: None  Dimension 2:  Biomedical Conditions and Complications:  Dimension 2:  Comments: None  Dimension 3:  Emotional, Behavioral, or Cognitive Conditions and Complications:  Dimension 3:  Comments: None  Dimension 4:  Readiness to Change:  Dimension 4:  Comments: None  Dimension 5:  Relapse, Continued use, or Continued Problem Potential:  Dimension 5:  Comments: None  Dimension 6:  Recovery/Living Environment:  Dimension 6:  Recovery/Living Environment Comments: None    Substance use Disorder (SUD)    Social Function:  Social Functioning Social Maturity: Isolates Social Judgement: Normal  Stress:  Stress Stressors: Family conflict, Illness(Knee surgery in the past year, Asthma.) Coping Ability: Overwhelmed Patient Takes Medications The Way The Doctor Instructed?: Yes Priority Risk: Low Acuity  Risk Assessment- Self-Harm Potential: Risk Assessment For Self-Harm Potential Thoughts of Self-Harm: No current thoughts Method: No plan Availability of Means: No access/NA Additional Information for Self-Harm Potential: Acts of Self-harm(The patient notes that she will have episodes where she slaps herself when she is upset and degrading herself) Additional Comments for Self-Harm Potential: No current S/I  Risk Assessment -Dangerous to Others Potential: Risk Assessment For Dangerous to Others Potential Method: No Plan Availability of Means: No access or NA Intent: Vague intent or  NA Notification Required: No need or identified person Additional Comments for Danger to Others Potential: No current H/I  DSM5 Diagnoses: Patient Active Problem List   Diagnosis Date Noted  . Encounter for gynecological examination with Papanicolaou smear of cervix 08/11/2019  . Urine pregnancy test negative 08/11/2019  . Generalized anxiety disorder 11/09/2017  . Chronic abdominal pain 10/16/2017    Patient Centered Plan: Patient is on the following Treatment Plan(s):  Depression and Anxiety Recommendations for Services/Supports/Treatments: Recommendations for Services/Supports/Treatments Recommendations For Services/Supports/Treatments: Individual Therapy, Medication Management  Treatment Plan Summary: OP Treatment Plan Summary: The OPT therapist will work with the patient to reduce/eliminate symptoms associated with her diagnoses (Depression/Anxiety) as measured by the patient having fewer than 2 episodes per week, as evidenced by the patient report  Referrals to Alternative Service(s): Referred to Alternative Service(s):   Place:   Date:   Time:    Referred to Alternative Service(s):   Place:   Date:   Time:    Referred to Alternative Service(s):   Place:   Date:   Time:    Referred to Alternative Service(s):   Place:   Date:   Time:      I discussed the assessment and treatment plan with the patient. The patient was provided an opportunity to ask questions and all were answered. The patient agreed with the plan and demonstrated an understanding of the instructions.   The patient was advised to call back or seek an in-person evaluation if the symptoms worsen or if the condition fails to improve as anticipated.  I provided 60 minutes of non-face-to-face time during this encounter.  Lennox Grumbles , LCSW 08/23/2019

## 2019-09-12 ENCOUNTER — Other Ambulatory Visit: Payer: Self-pay

## 2019-09-12 ENCOUNTER — Ambulatory Visit (INDEPENDENT_AMBULATORY_CARE_PROVIDER_SITE_OTHER): Payer: No Typology Code available for payment source | Admitting: Family Medicine

## 2019-09-12 ENCOUNTER — Encounter: Payer: Self-pay | Admitting: Family Medicine

## 2019-09-12 VITALS — BP 122/74 | HR 123 | Temp 98.1°F | Ht 61.0 in | Wt 146.0 lb

## 2019-09-12 DIAGNOSIS — L603 Nail dystrophy: Secondary | ICD-10-CM | POA: Diagnosis not present

## 2019-09-12 DIAGNOSIS — S99921A Unspecified injury of right foot, initial encounter: Secondary | ICD-10-CM

## 2019-09-12 NOTE — Progress Notes (Signed)
Patient ID: Renee Savage, female    DOB: 2001/02/08, 19 y.o.   MRN: [id number]   Chief Complaint  Patient presents with  . Nail Problem    ridges on toenail and having some bleeding    Subjective:    HPI 3 wks ago was noticing some blood on rt toe after pushing a heavy cart of groceries.  Small black area on toe nail rt, today. Bandaged it up at the time. Not hurting on rt toe at this time. Left big toe hurting 1 wk ago. Nail clippers at top and used them down near cuticles and has some ridges in nail near cuticle.  Narrow but long feet, so has a hard time finding shoes to fit correctly. Sometimes 2nd toe in the shoe is hitting the nail.  Closed toe shoes for last few months for the left knee surgery 03/15/20. Had PT till April 21. Was pushing a heavy cart 08/24/19 and felt it bleeding when taking sock off.    Medical History Oda has a past medical history of Allergy, Angio-edema, Anxiety, Asthma, Beta thalassemia ([place]), Beta thalassemia minor, Eczema, IBS (irritable bowel syndrome), Mental disorder, Reflux, and Urticaria.   Outpatient Encounter Medications as of 09/12/2019  Medication Sig  . escitalopram (LEXAPRO) 20 MG tablet Take 1.5 tablets (30 mg total) by mouth daily.  . hydrOXYzine (ATARAX/VISTARIL) 25 MG tablet Take 1 tablet (25 mg total) by mouth daily as needed for anxiety.  . LO LOESTRIN FE 1 MG-10 MCG / 10 MCG tablet TAKE 1 TABLET BY MOUTH  DAILY  . Multiple Vitamin (MULTIVITAMIN) tablet Take 1 tablet by mouth at bedtime.   . ondansetron (ZOFRAN ODT) 4 MG disintegrating tablet Take 1 tablet (4 mg total) by mouth every 6 (six) hours as needed for nausea or vomiting.  . sodium fluoride (PREVIDENT) 1.1 % GEL dental gel PreviDent 5000 Booster Plus 1.1 % dental paste  APPLY WITH TOOHBRUSH HS UTD  . albuterol (PROVENTIL HFA;VENTOLIN HFA) 108 (90 Base) MCG/ACT inhaler Inhale 2 puffs into the lungs every 6 (six) hours as needed for wheezing.  . [DISCONTINUED]  azithromycin (ZITHROMAX) 250 MG tablet Use as directed   No facility-administered encounter medications on file as of 09/12/2019.     Review of Systems  Constitutional: Negative for chills and fever.  HENT: Negative for congestion, rhinorrhea and sore throat.   Respiratory: Negative for cough, shortness of breath and wheezing.   Cardiovascular: Negative for chest pain and leg swelling.  Gastrointestinal: Negative for abdominal pain, diarrhea, nausea and vomiting.  Genitourinary: Negative for dysuria and frequency.  Musculoskeletal: Negative for arthralgias and back pain.  Skin: Negative for rash.       +toe nail pain/bleeding  Neurological: Negative for dizziness, weakness and headaches.     Vitals BP 122/74   Pulse (!) 123   Temp 98.1 F (36.7 C)   Ht 5\' 1"  (1.549 m)   Wt 146 lb (66.2 kg)   SpO2 98%   BMI 27.59 kg/m   Objective:   Physical Exam Vitals and nursing note reviewed.  Constitutional:      Appearance: Normal appearance.  Skin:    General: Skin is warm and dry.     Findings: No bruising, erythema, lesion or rash.     Comments: +rt great toe nail with small hematoma resolving near medial cuticle.  Longitudinal ridge in the nail growing from the cuticle.  +left great toe also with longitudinal ridge in the nail near cuticle.  2nd  rt toe- with longitudinal ridge in the nail, no bleeding, or hematoma.  Neurological:     General: No focal deficit present.     Mental Status: She is alert.     Cranial Nerves: No cranial nerve deficit.     Sensory: No sensory deficit.  Psychiatric:        Mood and Affect: Mood normal.        Behavior: Behavior normal.      Assessment and Plan   1. Dystrophic nail - Ambulatory referral to Podiatry  2. Injury of toenail of right foot, initial encounter - Ambulatory referral to Podiatry   Referred to podiatry.  Advising to wear larger sneakers to allow room from the toes.  Also avoid using clippers on the cuticles for now  to avoid infection/bleeding.  F/u prn.

## 2019-09-13 ENCOUNTER — Ambulatory Visit (INDEPENDENT_AMBULATORY_CARE_PROVIDER_SITE_OTHER): Payer: PRIVATE HEALTH INSURANCE | Admitting: Clinical

## 2019-09-13 DIAGNOSIS — F321 Major depressive disorder, single episode, moderate: Secondary | ICD-10-CM

## 2019-09-13 DIAGNOSIS — F411 Generalized anxiety disorder: Secondary | ICD-10-CM | POA: Diagnosis not present

## 2019-09-13 NOTE — Progress Notes (Addendum)
Virtual Visit via Video Note  I connected with Renee Savage on 09/13/19 at 10:00 AM EDT by a video enabled telemedicine application and verified that I am speaking with the correct person using two identifiers.  Location: Patient: Home Provider: Office    I discussed the limitations of evaluation and management by telemedicine and the availability of in person appointments. The patient expressed understanding and agreed to proceed.    THERAPIST PROGRESS NOTE  Session Time: 10:00AM-10:55AM  Participation Level: Active  Behavioral Response: CasualAlertAnxious  Type of Therapy: Individual Therapy  Treatment Goals addressed: Anxiety  Interventions: CBT, DBT, Motivational Interviewing, Solution Focused, Strength-based and Supportive  Summary: NAIJAH Savage is a 19 y.o. female who presents with Depression and Anxiety.The OPT therapist worked with thepatientfor her initial OPT treatment. The OPT therapist utilized Motivational Interviewing to assist in creating therapeutic repore. The patient in the session was engaged and work in Tour manager about hertriggers and symptoms over the past few weeksincludingongoing difficulty with publishing her book, balancing her college classes, and managing her in home responsibilities. The OPT therapist utilized Cognitive Behavioral Therapy through cognitive restructuring as well as worked with the patient on coping strategies to assist in management of anxiety and mood. The patient was tearful during parts of the session and the OPT therapist worked with the patient on doing self check-ins throughout her week and implementing coping to assist her in staying close to her emotional baseline.The OPT therapist inquired for holistic care about the patients adherence to medication therapy The patient noted the medication has been helpful and she is using as indicated.  Suicidal/Homicidal:Nowithout intent/plan  Therapist  Response:The OPT therapist worked with the patient for the patients scheduled session. The patient was engaged in her session and gave feedback in relation to triggers, symptoms, and behavior responses over the pastfewweeks. The OPT therapist worked with the patient utilizing an in session Cognitive Behavioral Therapy exercise. The patient was responsive in the session and verbalized, " I am willing to do the work between sessions, doing self check in and using coping skills". The patient indicated both compliance and effectiveness in relation to hercurrent medication therapy. The OPT therapist will continue treatment work with the patient in hernext scheduled session.  Plan: Return again in 2  weeks.  Diagnosis: Axis I: Generalized Anxiety Disorder and Depression, major, single episode, moderate    Axis II: No diagnosis  I discussed the assessment and treatment plan with the patient. The patient was provided an opportunity to ask questions and all were answered. The patient agreed with the plan and demonstrated an understanding of the instructions.   The patient was advised to call back or seek an in-person evaluation if the symptoms worsen or if the condition fails to improve as anticipated.  I provided 55 minutes of non-face-to-face time during this encounter.   [name], LCSW 09/13/2019

## 2019-09-15 ENCOUNTER — Encounter (HOSPITAL_COMMUNITY): Payer: Self-pay | Admitting: Psychiatry

## 2019-09-15 ENCOUNTER — Telehealth (INDEPENDENT_AMBULATORY_CARE_PROVIDER_SITE_OTHER): Payer: PRIVATE HEALTH INSURANCE | Admitting: Psychiatry

## 2019-09-15 ENCOUNTER — Other Ambulatory Visit: Payer: Self-pay

## 2019-09-15 DIAGNOSIS — F411 Generalized anxiety disorder: Secondary | ICD-10-CM | POA: Diagnosis not present

## 2019-09-15 DIAGNOSIS — F321 Major depressive disorder, single episode, moderate: Secondary | ICD-10-CM

## 2019-09-15 MED ORDER — ESCITALOPRAM OXALATE 20 MG PO TABS: 30.0000 mg | ORAL_TABLET | Freq: Every day | ORAL | 3 refills | Status: DC

## 2019-09-15 MED ORDER — HYDROXYZINE HCL 25 MG PO TABS: 25.0000 mg | ORAL_TABLET | Freq: Every day | ORAL | 2 refills | Status: DC | PRN

## 2019-09-15 NOTE — Progress Notes (Signed)
Virtual Visit via Video Note  I connected with [name] on 09/15/19 at  1:20 PM EDT by a video enabled telemedicine application and verified that I am speaking with the correct person using two identifiers.   I discussed the limitations of evaluation and management by telemedicine and the availability of in person appointments. The patient expressed understanding and agreed to proceed.    I discussed the assessment and treatment plan with the patient. The patient was provided an opportunity to ask questions and all were answered. The patient agreed with the plan and demonstrated an understanding of the instructions.   The patient was advised to call back or seek an in-person evaluation if the symptoms worsen or if the condition fails to improve as anticipated.  I provided 15 minutes of non-face-to-face time during this encounter. Location: Provider home, patient home  Levonne Spiller, MD  [facility] MD/PA/NP OP Progress Note  09/15/2019 1:49 PM IDALY VERRET  MRN:  [id number]  Chief Complaint:  Chief Complaint    Anxiety     HPI: This patient is a19 year old single white female who lives at home with her parents and 69 year old brother. 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  The patient was referred by her primary physician, Dr. Sallee Lange for further assessment and treatment of anxiety.  The patient states that she has been anxious for a number of years. Some of this started in middle school because she was being bullied. She also has a very contentious relationship with her father. She states that he tries to control everyone in the family but particularly her. They are always butting heads. She states that he comes from a "[place] family "and tends to be harsh. He is very critical of her appearance, her relationships, her major in school. He is also constantly criticizing little things like the timing of when she pays her tuition, how she does things around the  house etc. By her report he is very harsh and judgmental. He pushed her constantly to go into the same feel that he is-speech therapy even though she does not want to. He is very against her living on campus because of the cost.  The patient states over the last few months she has been extremely irritable, crying at the drop of a hat anxious and worried. She tends to obsess about things and expect the worst. She is very self-critical. She was dating a boy last fall and he broke up with her in December and this caused some of the symptoms to intensify. She has never had previous psychiatric treatment because her father does not believe in mental health treatment. She does have a lot of GI issues such as stomachaches and diarrhea which Dr. Wolfgang Phoenix thinks are related to stress. She is not able to sleep well and is constantly wakes up through the night. She has had some panic attacks particularly during arguments with dad. She denies being suicidal or engaging in any form of self-harm. She does not use alcohol drugs cigarettes or vaping. She is never been sexually active. She and her mother are very close and she is also close to her brother. She seems extremely managed in the family, trying to protect her brother from the father and also trying to make up for her mother's chronic headaches by being a "second mom"to brother who picks him up at school etc  Patient returns for follow-up after 6 weeks.  We had cut down her Lexapro from 40 to  30 mg because she felt fatigued.  She states that her fatigue is better and she is getting up and doing more things.  She states that she is faced with the dilemma about her publisher.  She has had 1 book published by 1 publisher who she really did not get along with.  Mother saying that they have to get the rest of the books from her in the series and warned that if she went to another publisher no one would read them.  She is tearful about this and upset.  I  explained that if her gut feeling is that she cannot work well with these people and she should continue.  She keeps going back and forth and cannot seem to make that decision.  She has talked to some mentors at her [employer] about this and they cannot advise her either way either.  Ultimately I told her that you have to feel good about a collaboration with others in order to be creative.  The patient denies severe depression suicidal ideation or panic attacks.  She is eating and sleeping well Visit Diagnosis:    ICD-10-CM   1. Generalized anxiety disorder  F41.1   2. Depression, major, single episode, moderate (HCC)  F32.1     Past Psychiatric History: none  Past Medical History:  Past Medical History:  Diagnosis Date  . Allergy    Chronic  . Angio-edema   . Anxiety   . Asthma   . Beta thalassemia (HCC)   . Beta thalassemia minor   . Eczema   . IBS (irritable bowel syndrome)   . Mental disorder   . Reflux   . Urticaria     Past Surgical History:  Procedure Laterality Date  . CYST REMOVAL HAND    . KNEE SURGERY Left 03/16/2019  . WISDOM TOOTH EXTRACTION  2016    Family Psychiatric History: see below  Family History:  Family History  Problem Relation Age of Onset  . Anxiety disorder Mother   . Depression Mother   . Depression Father   . Anxiety disorder Brother   . Depression Brother   . Depression Paternal Aunt   . Bipolar disorder Other     Social History:  Social History   Socioeconomic History  . Marital status: Single    Spouse name: Not on file  . Number of children: Not on file  . Years of education: Not on file  . Highest education level: Not on file  Occupational History  . Not on file  Tobacco Use  . Smoking status: Never Smoker  . Smokeless tobacco: Never Used  Vaping Use  . Vaping Use: Never used  Substance and Sexual Activity  . Alcohol use: No  . Drug use: No  . Sexual activity: Never    Birth control/protection: Pill  Other  Topics Concern  . Not on file  Social History Narrative  . Not on file   Social Determinants of Health   Financial Resource Strain: Low Risk   . Difficulty of Paying Living Expenses: Not hard at all  Food Insecurity: No Food Insecurity  . Worried About Programme researcher, broadcasting/film/video in the Last Year: Never true  . Ran Out of Food in the Last Year: Never true  Transportation Needs: No Transportation Needs  . Lack of Transportation (Medical): No  . Lack of Transportation (Non-Medical): No  Physical Activity: Insufficiently Active  . Days of Exercise per Week: 2 days  . Minutes of Exercise  per Session: 20 min  Stress: Stress Concern Present  . Feeling of Stress : Very much  Social Connections: Moderately Integrated  . Frequency of Communication with Friends and Family: Once a week  . Frequency of Social Gatherings with Friends and Family: More than three times a week  . Attends Religious Services: More than 4 times per year  . Active Member of Clubs or Organizations: Yes  . Attends Banker Meetings: 1 to 4 times per year  . Marital Status: Never married    Allergies:  Allergies  Allergen Reactions  . Amoxil [Amoxicillin] Nausea And Vomiting    Dizziness, light headed, loopy  . Cefzil [Cefprozil] Nausea And Vomiting    Vomiting (as an baby)  . Orange Oil Hives    Metabolic Disorder Labs: No results found for: HGBA1C, MPG No results found for: PROLACTIN No results found for: CHOL, TRIG, HDL, CHOLHDL, VLDL, LDLCALC Lab Results  Component Value Date   TSH 1.490 07/14/2019    Therapeutic Level Labs: No results found for: LITHIUM No results found for: VALPROATE No components found for:  CBMZ  Current Medications: Current Outpatient Medications  Medication Sig Dispense Refill  . albuterol (PROVENTIL HFA;VENTOLIN HFA) 108 (90 Base) MCG/ACT inhaler Inhale 2 puffs into the lungs every 6 (six) hours as needed for wheezing. 1 Inhaler 2  . escitalopram (LEXAPRO) 20 MG  tablet Take 1.5 tablets (30 mg total) by mouth daily. 180 tablet 3  . hydrOXYzine (ATARAX/VISTARIL) 25 MG tablet Take 1 tablet (25 mg total) by mouth daily as needed for anxiety. 90 tablet 2  . LO LOESTRIN FE 1 MG-10 MCG / 10 MCG tablet TAKE 1 TABLET BY MOUTH  DAILY 84 tablet 3  . Multiple Vitamin (MULTIVITAMIN) tablet Take 1 tablet by mouth at bedtime.     . ondansetron (ZOFRAN ODT) 4 MG disintegrating tablet Take 1 tablet (4 mg total) by mouth every 6 (six) hours as needed for nausea or vomiting. 20 tablet 0  . sodium fluoride (PREVIDENT) 1.1 % GEL dental gel PreviDent 5000 Booster Plus 1.1 % dental paste  APPLY WITH TOOHBRUSH HS UTD     No current facility-administered medications for this visit.     Musculoskeletal: Strength & Muscle Tone: within normal limits Gait & Station: normal Patient leans: N/A  Psychiatric Specialty Exam: Review of Systems  Psychiatric/Behavioral: The patient is nervous/anxious.   All other systems reviewed and are negative.   There were no vitals taken for this visit.There is no height or weight on file to calculate BMI.  General Appearance: Casual and Fairly Groomed  Eye Contact:  Good  Speech:  Clear and Coherent  Volume:  Normal  Mood:  Anxious  Affect:  Tearful  Thought Process:  Goal Directed  Orientation:  Full (Time, Place, and Person)  Thought Content: Rumination   Suicidal Thoughts:  No  Homicidal Thoughts:  No  Memory:  Immediate;   Good Recent;   Good Remote;   Good  Judgement:  Good  Insight:  Fair  Psychomotor Activity:  Normal  Concentration:  Concentration: Good and Attention Span: Good  Recall:  Good  Fund of Knowledge: Good  Language: Good  Akathisia:  No  Handed:  Right  AIMS (if indicated): not done  Assets:  Communication Skills Desire for Improvement Physical Health Resilience Social Support Talents/Skills  ADL's:  Intact  Cognition: WNL  Sleep:  Good   Screenings: GAD-7     [facility] Visit from 08/11/2019 in [facility]  [facility] Office Visit from 10/16/2017 in [facility]  Total GAD-7 Score 13 16    PHQ2-9     Office Visit from 08/11/2019 in [facility] Office Visit from 11/05/2018 in [facility]  PHQ-2 Total Score 1 1  PHQ-9 Total Score 15 13       Assessment and Plan: This patient is a 19 year old female with a history of depression and anxiety.  She still is having difficulty making decisions and I urged her to talk to other people in her field to have more experience.  She has restarted therapy which should be helpful.  She is doing better with the decreased dose of Lexapro so we will continue 30 mg daily for depression and anxiety and she can also use hydroxyzine 25 mg daily as needed for breakthrough anxiety.  She will return to see me in 2 months   [name], MD 09/15/2019, 1:49 PM

## 2019-09-23 ENCOUNTER — Encounter: Payer: Self-pay | Admitting: Family Medicine

## 2019-09-27 ENCOUNTER — Ambulatory Visit (INDEPENDENT_AMBULATORY_CARE_PROVIDER_SITE_OTHER): Payer: PRIVATE HEALTH INSURANCE | Admitting: Clinical

## 2019-09-27 ENCOUNTER — Other Ambulatory Visit: Payer: Self-pay

## 2019-09-27 DIAGNOSIS — F411 Generalized anxiety disorder: Secondary | ICD-10-CM | POA: Diagnosis not present

## 2019-09-27 DIAGNOSIS — F321 Major depressive disorder, single episode, moderate: Secondary | ICD-10-CM | POA: Diagnosis not present

## 2019-09-27 NOTE — Progress Notes (Addendum)
  Virtual Visit via Video Note  I connected withCatherine A Savage on 09/27/19 at 10:00 AM EDT by a video enabled telemedicine application and verified that I am speaking with the correct person using two identifiers.  Location: Patient: Home Provider: Office   I discussed the limitations of evaluation and management by telemedicine and the availability of in person appointments. The patient expressed understanding and agreed to proceed.    THERAPIST PROGRESS NOTE  Session Time: 10:00AM-10:45AM  Participation Level: Active  Behavioral Response: CasualAlertAnxious  Type of Therapy: Individual Therapy  Treatment Goals addressed: Anxiety  Interventions: CBT, DBT, Motivational Interviewing, Solution Focused, Strength-based and Supportive  Summary: Renee Savage is a 19 y.o. female who presents with Depression and Anxiety.The OPT therapist worked with thepatientfor her OPT treatment. The OPT therapist utilized Motivational Interviewing to assist in creating therapeutic repore. The patient in the session was engaged and work in Tour manager about hertriggers and symptoms over the past few weeksincludingongoing pressure from her caregiver to be more independent and to move out of the home. The OPT therapist utilized Cognitive Behavioral Therapy through cognitive restructuring as well as worked with the patient on coping strategies to assist in management of anxiety and mood. The patient was less emotional during the session and the OPT therapist gave praise to the patient for making a decision in relation to her stance with a publishing company in relation to the rights to her book seris this being a prior major stressor and trigger. The OPT therapist inquired for holistic care about the patients adherence to medication therapy The patient noted the medication has been helpful and she is using as indicated.  Suicidal/Homicidal:Nowithout  intent/plan  Therapist Response:The OPT therapist worked with the patient for the patients scheduled session. The patient was engaged in her session and gave feedback in relation to triggers, symptoms, and behavior responses over the pastfewweeks. The OPT therapist worked with the patient utilizing an in session Cognitive Behavioral Therapy exercise. The patient was responsive in the session and verbalized, " Iam willing to step out of my comfort zone to be more interactive with others". The patient indicated both compliance and effectiveness in relation to hercurrent medication therapy. The OPT therapist will continue treatment work with the patient in hernext scheduled session.  Plan: Return again in 2  weeks.  Diagnosis:      Axis I: Generalized Anxiety Disorder and Depression, major, single episode, moderate                          Axis II: No diagnosis  I discussed the assessment and treatment plan with the patient. The patient was provided an opportunity to ask questions and all were answered. The patient agreed with the plan and demonstrated an understanding of the instructions.  The patient was advised to call back or seek an in-person evaluation if the symptoms worsen or if the condition fails to improve as anticipated.  I provided 45 minutes of non-face-to-face time during this encounter.   [name], LCSW  09/27/2019

## 2019-09-29 ENCOUNTER — Other Ambulatory Visit: Payer: Self-pay

## 2019-09-29 ENCOUNTER — Encounter: Payer: Self-pay | Admitting: Podiatry

## 2019-09-29 ENCOUNTER — Ambulatory Visit (INDEPENDENT_AMBULATORY_CARE_PROVIDER_SITE_OTHER): Payer: No Typology Code available for payment source | Admitting: Podiatry

## 2019-09-29 DIAGNOSIS — S99929A Unspecified injury of unspecified foot, initial encounter: Secondary | ICD-10-CM | POA: Diagnosis not present

## 2019-09-29 NOTE — Progress Notes (Signed)
Subjective:  Patient ID: Renee Savage, female    DOB: [birth year],  MRN: [id number]  Chief Complaint  Patient presents with  . Nail Problem    i have some injuries to the big toenails     19 y.o. female presents with the above complaint.  She believes that she may have injured her bilateral hallux toenails.  She is not sure when.  She has a mother who is wheelchair-bound and she takes care of her as her primary caregiver.  She thinks may be possible that she had run over her toes with the wheelchair.  She noted some bleeding from the toenail several weeks ago.  They were tender, but this is improving.  Soaking has been helpful.  It looks like the nail is split and is coming off. Past Medical History:  Diagnosis Date  . Allergy    Chronic  . Angio-edema   . Anxiety   . Asthma   . Beta thalassemia (HCC)   . Beta thalassemia minor   . Eczema   . IBS (irritable bowel syndrome)   . Mental disorder   . Reflux   . Urticaria    Past Surgical History:  Procedure Laterality Date  . CYST REMOVAL HAND    . KNEE SURGERY Left 03/16/2019  . WISDOM TOOTH EXTRACTION  2016    Current Outpatient Medications:  .  albuterol (PROVENTIL HFA;VENTOLIN HFA) 108 (90 Base) MCG/ACT inhaler, Inhale 2 puffs into the lungs every 6 (six) hours as needed for wheezing., Disp: 1 Inhaler, Rfl: 2 .  escitalopram (LEXAPRO) 20 MG tablet, Take 1.5 tablets (30 mg total) by mouth daily., Disp: 180 tablet, Rfl: 3 .  hydrOXYzine (ATARAX/VISTARIL) 25 MG tablet, Take 1 tablet (25 mg total) by mouth daily as needed for anxiety., Disp: 90 tablet, Rfl: 2 .  LO LOESTRIN FE 1 MG-10 MCG / 10 MCG tablet, TAKE 1 TABLET BY MOUTH  DAILY, Disp: 84 tablet, Rfl: 3 .  Multiple Vitamin (MULTIVITAMIN) tablet, Take 1 tablet by mouth at bedtime. , Disp: , Rfl:  .  ondansetron (ZOFRAN ODT) 4 MG disintegrating tablet, Take 1 tablet (4 mg total) by mouth every 6 (six) hours as needed for nausea or vomiting., Disp: 20 tablet, Rfl: 0 .   sodium fluoride (PREVIDENT) 1.1 % GEL dental gel, PreviDent 5000 Booster Plus 1.1 % dental paste  APPLY WITH TOOHBRUSH HS UTD, Disp: , Rfl:   Allergies  Allergen Reactions  . Amoxil [Amoxicillin] Nausea And Vomiting    Dizziness, light headed, loopy  . Cefzil [Cefprozil] Nausea And Vomiting    Vomiting (as an baby)  . Orange Oil Hives   Review of Systems: Negative except as noted in the HPI. Denies N/V/F/Ch. Objective:  There were no vitals filed for this visit. General AA&O x3. Normal mood and affect.  Vascular Dorsalis pedis and posterior tibial pulses  present 2+ bilaterally  Capillary refill normal to all digits. Pedal hair growth normal.  Neurologic Epicritic sensation grossly present.  Dermatologic No open lesions. Interspaces clear of maceration. Nails well groomed.  There is a clear transversed traumatic line in the proximal one third of the nailbed of the bilateral hallux and second toenails.  It appears that a healthy new nail is growing in proximity to this.  Both the nail plate and new nail plates still remain well adhered to the nailbed.  Orthopedic: MMT 5/5 in dorsiflexion, plantarflexion, inversion, and eversion. Normal joint ROM without pain or crepitus.   Assessment & Plan:  Patient was evaluated and treated and all questions answered.  I discussed this traumatic nail injuries, and that she does not have any evidence of a subungual hematoma, there is no evidence of hallux fracture indication for x-rays at this time.  I did educate her that the tendon would come off overcome with some point and if this does occur then she may see [place] here in the office to remove it.  She will return as needed for further treatment.  Lanae Crumbly, DPM 09/30/2019    -  Return if symptoms worsen or fail to improve.

## 2019-09-29 NOTE — Patient Instructions (Signed)
Moleskin or orthopedic felt can help the strap from rubbing. Bandaid also makes a lubricant for the feet to keep areas from sliding

## 2019-10-11 ENCOUNTER — Other Ambulatory Visit: Payer: Self-pay

## 2019-10-11 ENCOUNTER — Ambulatory Visit (INDEPENDENT_AMBULATORY_CARE_PROVIDER_SITE_OTHER): Payer: No Typology Code available for payment source | Admitting: Clinical

## 2019-10-11 DIAGNOSIS — F411 Generalized anxiety disorder: Secondary | ICD-10-CM | POA: Diagnosis not present

## 2019-10-11 DIAGNOSIS — F321 Major depressive disorder, single episode, moderate: Secondary | ICD-10-CM

## 2019-10-11 NOTE — Progress Notes (Signed)
  Virtual Visit via Video Note  I connected withCatherine A Savage 07/06/21at 10:00 AM EDTby a video enabled telemedicine application and verified that I am speaking with the correct person using two identifiers.  Location: Patient:Home Provider:Office  I discussed the limitations of evaluation and management by telemedicine and the availability of in person appointments. The patient expressed understanding and agreed to proceed.    THERAPIST PROGRESS NOTE  Session Time:10:00AM-10:45AM  Participation Level:Active  Behavioral Response:CasualAlertAnxious  Type of Therapy:Individual Therapy  Treatment Goals addressed:Anxiety  Interventions:CBT, DBT, Motivational Interviewing, Solution Focused, Strength-based and Supportive  Summary:Renee Savage a 19 y.o.femalewho presents with Depression and Anxiety.The OPT therapist worked with thepatientfor herongoing OPT treatment. The OPT therapist utilized Motivational Interviewing to assist in creating therapeutic repore. 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 The OPT therapist worked with the patient promoting peer socialization.  The OPT therapist inquired for holistic care about the patients adherence to medication therapy  Suicidal/Homicidal:Nowithout intent/plan  Therapist Response:The OPT therapist worked with the patient for the patients scheduled session. The patient was engaged in her session and gave feedback in relation to triggers, symptoms, and behavior responses over the pastfewweeks. The OPT therapist worked with the patient utilizing an in session Cognitive Behavioral Therapy exercise. The patient was responsive in the session and verbalized, " Iam going to look into clubs and groups available at my university when I start back to school for my final year this Fall". The patient has been implementing coping skills including, painting/drawing, reading, and going for community outings. The patient indicated both compliance and effectiveness in relation to hercurrent medication therapy. The OPT therapist will continue treatment work with the patient in hernext scheduled session.  Plan: Return again in2weeks.  Diagnosis:Axis I:Generalized Anxiety Disorder andDepression, major, single episode, moderate  Axis II:No diagnosis  I discussed the assessment and treatment plan with the patient. The patient was provided an opportunity to ask questions and all were answered. The patient agreed with the plan and demonstrated an understanding of the instructions.  The patient was advised to call back or seek an in-person evaluation if the symptoms worsen or if the condition fails to improve as anticipated.  I provided52minutes of non-face-to-face time during this encounter.   Renee Burn, LCSW 10/11/2019

## 2019-10-25 ENCOUNTER — Other Ambulatory Visit: Payer: Self-pay

## 2019-10-25 ENCOUNTER — Ambulatory Visit (INDEPENDENT_AMBULATORY_CARE_PROVIDER_SITE_OTHER): Payer: No Typology Code available for payment source | Admitting: Clinical

## 2019-10-25 DIAGNOSIS — F321 Major depressive disorder, single episode, moderate: Secondary | ICD-10-CM

## 2019-10-25 DIAGNOSIS — F411 Generalized anxiety disorder: Secondary | ICD-10-CM

## 2019-10-25 NOTE — Progress Notes (Signed)
  Virtual Visit via Video Note  I connected withCatherine A Savage 07/20/21at 11:00 AM EDTby a video enabled telemedicine application and verified that I am speaking with the correct person using two identifiers.  Location: Patient:Home Provider:Office  I discussed the limitations of evaluation and management by telemedicine and the availability of in person appointments. The patient expressed understanding and agreed to proceed.    THERAPIST PROGRESS NOTE  Session Time:11:00AM-11:40AM  Participation Level:Active  Behavioral Response:CasualAlertAnxious  Type of Therapy:Individual Therapy  Treatment Goals addressed:Anxiety  Interventions:CBT, DBT, Motivational Interviewing, Solution Focused, Strength-based and Supportive  Summary:Renee A Kopfis a 19 y.o.femalewho presents with Depression and Anxiety.The OPT therapist worked with thepatientfor herongoing OPT treatment. The OPT therapist utilized Motivational Interviewing to assist in creating therapeutic repore. 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 The OPT therapist utilized Cognitive Behavioral Therapy through cognitive restructuring as well as worked with the patient on coping strategies to assist in management of anxiety andmood.The OPT therapist continued to work with the patient on the importance of managing her basics including eating, sleeping, and hygiene/health care. The OPT therapist continued to overview the importance of time management and  leisure/work balance. The OPT therapist did place emphasis on the patient trying [place] to assist in re-regulating her sleep/wake cycle, not enabling others in the home to allow the patient to continue managing all of her Mothers recovery needs, and using a time Financial trader including white board for time management. The OPT therapist worked with the patient promoting peer socialization.  Suicidal/Homicidal:Nowithout intent/plan  Therapist Response:The OPT therapist worked with the patient for the patients scheduled session. The patient was engaged in hersession and gave feedback in relation to triggers, symptoms, and behavior responses over the pastfewweeks. The OPT therapist worked with the patient utilizing an in session Cognitive Behavioral Therapy exercise. The patient was responsive in the session and verbalized, " Iam going to try getting back to getting more sleep, I am going to take some time this weekend to get out and do something I want (shopping)". The patient has been implementing coping skills. The patient indicated both compliance and effectiveness in relation to hercurrent medication therapy. The OPT therapist will continue treatment work with the patient in hernext scheduled session.  Plan: Return again in3weeks.  Diagnosis:Axis I:Generalized Anxiety Disorder andDepression, major, single episode, moderate  Axis II:No diagnosis  I discussed the assessment and treatment plan with the patient. The patient was provided an opportunity to ask questions and all were answered. The patient agreed with the plan and demonstrated an understanding of the instructions.  The patient was advised to call back or seek an in-person evaluation if the symptoms worsen or if the condition fails to improve as anticipated.  I provided53minutes of non-face-to-face time during this encounter.   [name], LCSW 10/25/2019

## 2019-11-05 ENCOUNTER — Other Ambulatory Visit: Payer: Self-pay | Admitting: Nurse Practitioner

## 2019-11-21 ENCOUNTER — Other Ambulatory Visit: Payer: Self-pay

## 2019-11-21 ENCOUNTER — Ambulatory Visit (HOSPITAL_COMMUNITY): Payer: No Typology Code available for payment source | Admitting: Clinical

## 2019-11-21 DIAGNOSIS — F411 Generalized anxiety disorder: Secondary | ICD-10-CM

## 2019-11-21 DIAGNOSIS — F321 Major depressive disorder, single episode, moderate: Secondary | ICD-10-CM

## 2019-11-21 NOTE — Progress Notes (Signed)
  Virtual Visit via Video Note  I connected withCatherine A Savage 08/16/21at 4:00 PM EDTby a video enabled telemedicine application and verified that I am speaking with the correct person using two identifiers.  Location: Patient:Home Provider:Office  I discussed the limitations of evaluation and management by telemedicine and the availability of in person appointments. The patient expressed understanding and agreed to proceed.    THERAPIST PROGRESS NOTE  Session Time:4:00AM-4:40PM  Participation Level:Active  Behavioral Response:CasualAlertAnxious  Type of Therapy:Individual Therapy  Treatment Goals addressed:Anxiety  Interventions:CBT, DBT, Motivational Interviewing, Solution Focused, Strength-based and Supportive  Summary:Renee A Kopfis a 19 y.o.femalewho presents with Depression and Anxiety.The OPT therapist worked with thepatientfor herongoingOPT treatment. The OPT therapist utilized Motivational Interviewing to assist in creating therapeutic repore. The patient in the session was engaged and work in Tour manager about hertriggers and symptoms over the past few weeksincludingongoing involvement in caregiving for her Mother who is currently recovering from shoulder surgery,and preparation to return to college for Fall classes. The OPT therapist utilized Cognitive Behavioral Therapy through cognitive restructuring as well as worked with the patient on coping strategies to assist in management ofanxiety andmood.The OPT therapist continued to work with the patient on the importance of managing her basics including eating, sleeping, and hygiene/health care.The OPT therapist continued to overview the importance of time management between caregiving and leisure/socialization balance.  Suicidal/Homicidal:Nowithout intent/plan  Therapist Response:The OPT therapist worked with the patient for the patients scheduled session. The  patient was engaged in hersession and gave feedback in relation to triggers, symptoms, and behavior responses over the pastfewweeks. The OPT therapist worked with the patient utilizing an in session Cognitive Behavioral Therapy exercise. The patient was responsive in the session and verbalized, " Iwant to be able to stay around campus even after classes and go places/shopping while in [place]".The patient has been implementing coping skills.The patient indicated both compliance and effectiveness in relation to hercurrent medication therapy. The OPT therapist will continue treatment work with the patient in hernext scheduled session.  Plan: Return again in3weeks.  Diagnosis:Axis I:Generalized Anxiety Disorder andDepression, major, single episode, moderate  Axis II:No diagnosis  I discussed the assessment and treatment plan with the patient. The patient was provided an opportunity to ask questions and all were answered. The patient agreed with the plan and demonstrated an understanding of the instructions.  The patient was advised to call back or seek an in-person evaluation if the symptoms worsen or if the condition fails to improve as anticipated.  I provided24minutes of non-face-to-face time during this encounter.   [name], LCSW 11/21/2019

## 2019-11-30 ENCOUNTER — Telehealth (HOSPITAL_COMMUNITY): Payer: Self-pay | Admitting: Psychiatry

## 2019-11-30 NOTE — Telephone Encounter (Signed)
Called to schedule follow up appt, left detailed voicemail 

## 2019-12-05 ENCOUNTER — Other Ambulatory Visit: Payer: Self-pay

## 2019-12-05 ENCOUNTER — Encounter: Payer: Self-pay | Admitting: Family Medicine

## 2019-12-05 ENCOUNTER — Telehealth (INDEPENDENT_AMBULATORY_CARE_PROVIDER_SITE_OTHER): Payer: No Typology Code available for payment source | Admitting: Psychiatry

## 2019-12-05 ENCOUNTER — Encounter (HOSPITAL_COMMUNITY): Payer: Self-pay

## 2019-12-05 ENCOUNTER — Ambulatory Visit (INDEPENDENT_AMBULATORY_CARE_PROVIDER_SITE_OTHER): Payer: No Typology Code available for payment source | Admitting: Family Medicine

## 2019-12-05 ENCOUNTER — Encounter (HOSPITAL_COMMUNITY): Payer: Self-pay | Admitting: Psychiatry

## 2019-12-05 VITALS — BP 118/78 | HR 106 | Temp 98.6°F | Ht 61.0 in | Wt 148.2 lb

## 2019-12-05 DIAGNOSIS — G43009 Migraine without aura, not intractable, without status migrainosus: Secondary | ICD-10-CM

## 2019-12-05 DIAGNOSIS — F411 Generalized anxiety disorder: Secondary | ICD-10-CM

## 2019-12-05 DIAGNOSIS — F321 Major depressive disorder, single episode, moderate: Secondary | ICD-10-CM | POA: Diagnosis not present

## 2019-12-05 MED ORDER — ESCITALOPRAM OXALATE 20 MG PO TABS: 30.0000 mg | ORAL_TABLET | Freq: Every day | ORAL | 3 refills | Status: DC

## 2019-12-05 MED ORDER — HYDROXYZINE HCL 25 MG PO TABS: 25.0000 mg | ORAL_TABLET | Freq: Every day | ORAL | 2 refills | Status: DC | PRN

## 2019-12-05 MED ORDER — SUMATRIPTAN SUCCINATE 25 MG PO TABS: ORAL_TABLET | ORAL | 0 refills | Status: DC

## 2019-12-05 NOTE — Patient Instructions (Addendum)
Use Advil at the first sign of headache. If not effective, take sumatriptan as directed. Follow-up in one week.      Migraine Headache A migraine headache is a very strong throbbing pain on one side or both sides of your head. This type of headache can also cause other symptoms. It can last from 4 hours to 3 days. Talk with your doctor about what things may bring on (trigger) this condition. What are the causes? The exact cause of this condition is not known. This condition may be triggered or caused by:  Drinking alcohol.  Smoking.  Taking medicines, such as: ? Medicine used to treat chest pain (nitroglycerin). ? Birth control pills. ? Estrogen. ? Some blood pressure medicines.  Eating or drinking certain products.  Doing physical activity. Other things that may trigger a migraine headache include:  Having a menstrual period.  Pregnancy.  Hunger.  Stress.  Not getting enough sleep or getting too much sleep.  Weather changes.  Tiredness (fatigue). What increases the risk?  Being 23-62 years old.  Being female.  Having a family history of migraine headaches.  Being Caucasian.  Having depression or anxiety.  Being very overweight. What are the signs or symptoms?  A throbbing pain. This pain may: ? Happen in any area of the head, such as on one side or both sides. ? Make it hard to do daily activities. ? Get worse with physical activity. ? Get worse around bright lights or loud noises.  Other symptoms may include: ? Feeling sick to your stomach (nauseous). ? Vomiting. ? Dizziness. ? Being sensitive to bright lights, loud noises, or smells.  Before you get a migraine headache, you may get warning signs (an aura). An aura may include: ? Seeing flashing lights or having blind spots. ? Seeing bright spots, halos, or zigzag lines. ? Having tunnel vision or blurred vision. ? Having numbness or a tingling feeling. ? Having trouble talking. ? Having weak  muscles.  Some people have symptoms after a migraine headache (postdromal phase), such as: ? Tiredness. ? Trouble thinking (concentrating). How is this treated?  Taking medicines that: ? Relieve pain. ? Relieve the feeling of being sick to your stomach. ? Prevent migraine headaches.  Treatment may also include: ? Having acupuncture. ? Avoiding foods that bring on migraine headaches. ? Learning ways to control your body functions (biofeedback). ? Therapy to help you know and deal with negative thoughts (cognitive behavioral therapy). Follow these instructions at home: Medicines  Take over-the-counter and prescription medicines only as told by your doctor.  Ask your doctor if the medicine prescribed to you: ? Requires you to avoid driving or using heavy machinery. ? Can cause trouble pooping (constipation). You may need to take these steps to prevent or treat trouble pooping:  Drink enough fluid to keep your pee (urine) pale yellow.  Take over-the-counter or prescription medicines.  Eat foods that are high in fiber. These include beans, whole grains, and fresh fruits and vegetables.  Limit foods that are high in fat and sugar. These include fried or sweet foods. Lifestyle  Do not drink alcohol.  Do not use any products that contain nicotine or tobacco, such as cigarettes, e-cigarettes, and chewing tobacco. If you need help quitting, ask your doctor.  Get at least 8 hours of sleep every night.  Limit and deal with stress. General instructions      Keep a journal to find out what may bring on your migraine headaches. For example, write  down: ? What you eat and drink. ? How much sleep you get. ? Any change in what you eat or drink. ? Any change in your medicines.  If you have a migraine headache: ? Avoid things that make your symptoms worse, such as bright lights. ? It may help to lie down in a dark, quiet room. ? Do not drive or use heavy machinery. ? Ask your  doctor what activities are safe for you.  Keep all follow-up visits as told by your doctor. This is important. Contact a doctor if:  You get a migraine headache that is different or worse than others you have had.  You have more than 15 headache days in one month. Get help right away if:  Your migraine headache gets very bad.  Your migraine headache lasts longer than 72 hours.  You have a fever.  You have a stiff neck.  You have trouble seeing.  Your muscles feel weak or like you cannot control them.  You start to lose your balance a lot.  You start to have trouble walking.  You pass out (faint).  You have a seizure. Summary  A migraine headache is a very strong throbbing pain on one side or both sides of your head. These headaches can also cause other symptoms.  This condition may be treated with medicines and changes to your lifestyle.  Keep a journal to find out what may bring on your migraine headaches.  Contact a doctor if you get a migraine headache that is different or worse than others you have had.  Contact your doctor if you have more than 15 headache days in a month. This information is not intended to replace advice given to you by your health care provider. Make sure you discuss any questions you have with your health care provider. Document Revised: 07/16/2018 Document Reviewed: 05/06/2018 Elsevier Patient Education  2020 [employer].

## 2019-12-05 NOTE — Progress Notes (Signed)
Patient ID: Renee Savage, female    DOB: Dec 16, 2000, 19 y.o.   MRN: [id number]   Chief Complaint  Patient presents with  . Headache   Subjective:    HPI Pt having headaches. Mostly on sides and going toward back of head. History of migraines in family. Recently began; sometimes will stop. Mom gave her Promethizine and Aleve.    Medical History Renee Savage has a past medical history of Allergy, Angio-edema, Anxiety, Asthma, Beta thalassemia (HCC), Beta thalassemia minor, Eczema, IBS (irritable bowel syndrome), Mental disorder, Reflux, and Urticaria.   Outpatient Encounter Medications as of 12/05/2019  Medication Sig  . albuterol (PROVENTIL HFA;VENTOLIN HFA) 108 (90 Base) MCG/ACT inhaler Inhale 2 puffs into the lungs every 6 (six) hours as needed for wheezing.  Marland Kitchen escitalopram (LEXAPRO) 20 MG tablet Take 1.5 tablets (30 mg total) by mouth daily.  . hydrOXYzine (ATARAX/VISTARIL) 25 MG tablet Take 1 tablet (25 mg total) by mouth daily as needed for anxiety.  . LO LOESTRIN FE 1 MG-10 MCG / 10 MCG tablet TAKE 1 TABLET BY MOUTH  DAILY  . Multiple Vitamin (MULTIVITAMIN) tablet Take 1 tablet by mouth at bedtime.   . ondansetron (ZOFRAN ODT) 4 MG disintegrating tablet Take 1 tablet (4 mg total) by mouth every 6 (six) hours as needed for nausea or vomiting.  . sodium fluoride (PREVIDENT) 1.1 % GEL dental gel PreviDent 5000 Booster Plus 1.1 % dental paste  APPLY WITH TOOHBRUSH HS UTD  . SUMAtriptan (IMITREX) 25 MG tablet May repeat in 2 hours if headache persists or recurs (times three times).   No facility-administered encounter medications on file as of 12/05/2019.     Review of Systems  Constitutional: Negative for chills, fatigue and fever.  HENT: Negative.   Eyes: Positive for photophobia.       Started Saturday evening with throbbing headache.  Respiratory: Negative.   Cardiovascular: Negative.   Gastrointestinal: Negative.   Endocrine: Negative.   Genitourinary: Negative.     Musculoskeletal: Negative.   Skin: Negative.   Allergic/Immunologic: Negative.   Neurological: Positive for headaches.       Describes as throbbing, off and on since Saturday. Denies aura. Sensitive to light and noise. On computer a lot with college work.     Vitals BP 118/78   Pulse (!) 106   Temp 98.6 F (37 C)   Ht 5\' 1"  (1.549 m)   Wt 148 lb 3.2 oz (67.2 kg)   SpO2 98%   BMI 28.00 kg/m   Objective:   Physical Exam Constitutional:      General: She is not in acute distress.    Appearance: She is well-developed. She is not ill-appearing or toxic-appearing.  Eyes:     Comments: Deferred due to photophobia.  Cardiovascular:     Rate and Rhythm: Normal rate and regular rhythm.     Heart sounds: Normal heart sounds.  Pulmonary:     Effort: Pulmonary effort is normal.     Breath sounds: Normal breath sounds.  Skin:    General: Skin is warm and dry.  Neurological:     Mental Status: She is alert.     Cranial Nerves: No facial asymmetry.     Motor: No weakness.     Coordination: Coordination normal.  Psychiatric:        Speech: Speech normal.      Assessment and Plan   1. Migraine without aura and without status migrainosus, not intractable - SUMAtriptan (IMITREX) 25 MG  tablet; May repeat in 2 hours if headache persists or recurs (times three times).  Dispense: 10 tablet; Refill: 0   Renee Savage presents today with c/o of headache off and on since Saturday evening. She is having pain with light exposure- lights off in exam room and wears sunglasses to leave office.   Denies cardiac history. Mother has history of migraine.  Positive nausea. Had Covid -19 vaccine.   Agrees with plan of care discussed today. Understands warning signs to seek further care: fever, worst headache of my life, visual changes (go to ED immediately). Understands to follow-up in one week for effectiveness of treatment. She may need migraine prophylaxis treatment in future.  School note  given for consideration of extension on assignments due to headache.   Neuro exam normal in office. No red flags today.    Renee Olive, NP 12/05/2019

## 2019-12-05 NOTE — Progress Notes (Signed)
Virtual Visit via Video Note  I connected with Renee Basemanatherine A Halle on 12/05/19 at  2:40 PM EDT by a video enabled telemedicine application and verified that I am speaking with the correct person using two identifiers.   I discussed the limitations of evaluation and management by telemedicine and the availability of in person appointments. The patient expressed understanding and agreed to proceed    I discussed the assessment and treatment plan with the patient. The patient was provided an opportunity to ask questions and all were answered. The patient agreed with the plan and demonstrated an understanding of the instructions.   The patient was advised to call back or seek an in-person evaluation if the symptoms worsen or if the condition fails to improve as anticipated.  I provided 15 minutes of non-face-to-face time during this encounter. Location: Provider office, patient home  [name] Abhishek Levesque, MD  [facility] MD/PA/NP OP Progress Note  12/05/2019 3:03 PM Renee BasemanCatherine A Savage  MRN:  [id number]  Chief Complaint:  Chief Complaint    Depression; Anxiety; Follow-up     HPI: This patient is a[age] year old single white female who lives at home with her parents and 21 year old brother. 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  The patient was referred by her primary physician, Dr. Lilyan PuntScott Luking for further assessment and treatment of anxiety.  The patient states that she has been anxious for a number of years. Some of this started in middle school because she was being bullied. She also has a very contentious relationship with her father. She states that he tries to control everyone in the family but particularly her. They are always butting heads. She states that he comes from a "military family "and tends to be harsh. He is very critical of her appearance, her relationships, her major in school. He is also constantly criticizing little things like the timing of when she pays her tuition, how she  does things around the house etc. By her report he is very harsh and judgmental. He pushed her constantly to go into the same feel that he is-speech therapy even though she does not want to. He is very against her living on campus because of the cost.  The patient states over the last few months she has been extremely irritable, crying at the drop of a hat anxious and worried. She tends to obsess about things and expect the worst. She is very self-critical. She was dating a boy last fall and he broke up with her in December and this caused some of the symptoms to intensify. She has never had previous psychiatric treatment because her father does not believe in mental health treatment. She does have a lot of GI issues such as stomachaches and diarrhea which Dr. Gerda DissLuking thinks are related to stress. She is not able to sleep well and is constantly wakes up through the night. She has had some panic attacks particularly during arguments with dad. She denies being suicidal or engaging in any form of self-harm. She does not use alcohol drugs cigarettes or vaping. She is never been sexually active. She and her mother are very close and she is also close to her brother. She seems extremely managed in the family, trying to protect her brother from the father and also trying to make up for her mother's chronic headaches by being a "second mom"to brother who picks him up at school etc  The patient returns for follow-up after 3 months.  She states that she is very  stressed.  She has started college and is doing in person classes again.  Her mother still is suffering from frozen shoulder even though she had surgery for it.  The mother still cannot drive so the patient is driving her to our physical therapy doctor appointments.  Her father also expects her to do a lot of the housework and cooking.  She states that she is overwhelmed.  She is developing headaches that sound like migraine and she has associated  photophobia.  She is seeing her PCP about this today.  I urged her to set limits on herself and ask her brother and father to help more with the mother's care as well as the food preparation.  It sounds like these are deeply ingrained roles in the family.  She does think that the medications are still helping with her anxiety and depression and her mood for the most part has been stable Visit Diagnosis:    ICD-10-CM   1. Generalized anxiety disorder  F41.1   2. Depression, major, single episode, moderate (HCC)  F32.1     Past Psychiatric History: none  Past Medical History:  Past Medical History:  Diagnosis Date  . Allergy    Chronic  . Angio-edema   . Anxiety   . Asthma   . Beta thalassemia (HCC)   . Beta thalassemia minor   . Eczema   . IBS (irritable bowel syndrome)   . Mental disorder   . Reflux   . Urticaria     Past Surgical History:  Procedure Laterality Date  . CYST REMOVAL HAND    . KNEE SURGERY Left 03/16/2019  . WISDOM TOOTH EXTRACTION  2016    Family Psychiatric History: see below  Family History:  Family History  Problem Relation Age of Onset  . Anxiety disorder Mother   . Depression Mother   . Depression Father   . Anxiety disorder Brother   . Depression Brother   . Depression Paternal Aunt   . Bipolar disorder Other     Social History:  Social History   Socioeconomic History  . Marital status: Single    Spouse name: Not on file  . Number of children: Not on file  . Years of education: Not on file  . Highest education level: Not on file  Occupational History  . Not on file  Tobacco Use  . Smoking status: Never Smoker  . Smokeless tobacco: Never Used  Vaping Use  . Vaping Use: Never used  Substance and Sexual Activity  . Alcohol use: No  . Drug use: No  . Sexual activity: Never    Birth control/protection: Pill  Other Topics Concern  . Not on file  Social History Narrative  . Not on file   Social Determinants of Health   Financial  Resource Strain: Low Risk   . Difficulty of Paying Living Expenses: Not hard at all  Food Insecurity: No Food Insecurity  . Worried About Programme researcher, broadcasting/film/video in the Last Year: Never true  . Ran Out of Food in the Last Year: Never true  Transportation Needs: No Transportation Needs  . Lack of Transportation (Medical): No  . Lack of Transportation (Non-Medical): No  Physical Activity: Insufficiently Active  . Days of Exercise per Week: 2 days  . Minutes of Exercise per Session: 20 min  Stress: Stress Concern Present  . Feeling of Stress : Very much  Social Connections: Moderately Integrated  . Frequency of Communication with Friends and Family: Once  a week  . Frequency of Social Gatherings with Friends and Family: More than three times a week  . Attends Religious Services: More than 4 times per year  . Active Member of Clubs or Organizations: Yes  . Attends Banker Meetings: 1 to 4 times per year  . Marital Status: Never married    Allergies:  Allergies  Allergen Reactions  . Amoxil [Amoxicillin] Nausea And Vomiting    Dizziness, light headed, loopy  . Cefzil [Cefprozil] Nausea And Vomiting    Vomiting (as an baby)  . Orange Oil Hives    Metabolic Disorder Labs: No results found for: HGBA1C, MPG No results found for: PROLACTIN No results found for: CHOL, TRIG, HDL, CHOLHDL, VLDL, LDLCALC Lab Results  Component Value Date   TSH 1.490 07/14/2019    Therapeutic Level Labs: No results found for: LITHIUM No results found for: VALPROATE No components found for:  CBMZ  Current Medications: Current Outpatient Medications  Medication Sig Dispense Refill  . albuterol (PROVENTIL HFA;VENTOLIN HFA) 108 (90 Base) MCG/ACT inhaler Inhale 2 puffs into the lungs every 6 (six) hours as needed for wheezing. 1 Inhaler 2  . escitalopram (LEXAPRO) 20 MG tablet Take 1.5 tablets (30 mg total) by mouth daily. 180 tablet 3  . hydrOXYzine (ATARAX/VISTARIL) 25 MG tablet Take 1 tablet  (25 mg total) by mouth daily as needed for anxiety. 90 tablet 2  . LO LOESTRIN FE 1 MG-10 MCG / 10 MCG tablet TAKE 1 TABLET BY MOUTH  DAILY 84 tablet 3  . Multiple Vitamin (MULTIVITAMIN) tablet Take 1 tablet by mouth at bedtime.     . ondansetron (ZOFRAN ODT) 4 MG disintegrating tablet Take 1 tablet (4 mg total) by mouth every 6 (six) hours as needed for nausea or vomiting. 20 tablet 0  . sodium fluoride (PREVIDENT) 1.1 % GEL dental gel PreviDent 5000 Booster Plus 1.1 % dental paste  APPLY WITH TOOHBRUSH HS UTD     No current facility-administered medications for this visit.     Musculoskeletal: Strength & Muscle Tone: within normal limits Gait & Station: normal Patient leans: N/A  Psychiatric Specialty Exam: Review of Systems  Neurological: Positive for headaches.  All other systems reviewed and are negative.   There were no vitals taken for this visit.There is no height or weight on file to calculate BMI.  General Appearance: Casual and Fairly Groomed  Eye Contact:  Good  Speech:  Clear and Coherent  Volume:  Normal  Mood:  Anxious  Affect:  Flat  Thought Process:  Goal Directed  Orientation:  Full (Time, Place, and Person)  Thought Content: Rumination   Suicidal Thoughts:  No  Homicidal Thoughts:  No  Memory:  Immediate;   Good Recent;   Good Remote;   Good  Judgement:  Good  Insight:  Good  Psychomotor Activity:  Decreased  Concentration:  Concentration: Good and Attention Span: Good  Recall:  Good  Fund of Knowledge: Good  Language: Good  Akathisia:  No  Handed:  Right  AIMS (if indicated): not done  Assets:  Communication Skills Desire for Improvement Physical Health Resilience Social Support Talents/Skills Vocational/Educational  ADL's:  Intact  Cognition: WNL  Sleep:  Good   Screenings: GAD-7     Office Visit from 08/11/2019 in [facility] Office Visit from 10/16/2017 in [facility]  Total GAD-7 Score 13 16    PHQ2-9      Office Visit from 08/11/2019 in [facility]  Office Visit from 11/05/2018 in [facility]  PHQ-2 Total Score 1 1  PHQ-9 Total Score 15 13       Assessment and Plan: This patient is a 19 year old female with a history of depression and anxiety.  She is still struggling to manage all the things expected of her and I urged her to set limits.  For now she will continue Lexapro 30 mg for depression anxiety and hydroxyzine 25 mg daily as needed for breakthrough anxiety.  She will return to see me in 3 months   [name], MD 12/05/2019, 3:03 PM

## 2019-12-13 ENCOUNTER — Ambulatory Visit (INDEPENDENT_AMBULATORY_CARE_PROVIDER_SITE_OTHER): Payer: No Typology Code available for payment source | Admitting: Clinical

## 2019-12-13 ENCOUNTER — Other Ambulatory Visit: Payer: Self-pay

## 2019-12-13 DIAGNOSIS — F411 Generalized anxiety disorder: Secondary | ICD-10-CM

## 2019-12-13 DIAGNOSIS — F321 Major depressive disorder, single episode, moderate: Secondary | ICD-10-CM | POA: Diagnosis not present

## 2019-12-13 NOTE — Progress Notes (Signed)
  Virtual Visit via Video Note  I connected withCatherine A Savage 09/[birth year]/21at 2:00 PM EDTby a video enabled telemedicine application and verified that I am speaking with the correct person using two identifiers.  Location: Patient:Home Provider:Office  I discussed the limitations of evaluation and management by telemedicine and the availability of in person appointments. The patient expressed understanding and agreed to proceed.    THERAPIST PROGRESS NOTE  Session Time:2:00AM-2:55PM  Participation Level:Active  Behavioral Response:CasualAlertAnxious  Type of Therapy:Individual Therapy  Treatment Goals addressed:Anxiety  Interventions:CBT, DBT, Motivational Interviewing, Solution Focused, Strength-based and Supportive  Summary:Renee A Kopfis a 19 y.o.femalewho presents with Depression and Anxiety.The OPT therapist worked with thepatientfor herongoingOPT treatment. The OPT therapist utilized Motivational Interviewing to assist in creating therapeutic repore. The patient in the session was engaged and work in Tour manager about hertriggers and symptoms over the past few weeksincludingongoinginvolvement in caregiving for her Mother who is currently recovering fromshoulder surgery, adjusting to college coursework, and a family member in serious medical condition. The OPT therapist utilized Cognitive Behavioral Therapy through cognitive restructuring as well as worked with the patient on coping strategies to assist in management ofanxiety andmood.The OPT therapist continued to work with the patient on the importance of managing her basics including eating, sleeping, and hygiene/health care.The OPT therapist continued to overview the importance of time management between caregiving and leisure/socialization balance.  Suicidal/Homicidal:Nowithout intent/plan  Therapist Response:The OPT therapist worked with the patient for the  patients scheduled session. The patient was engaged in hersession and gave feedback in relation to triggers, symptoms, and behavior responses over the pastfewweeks. The OPT therapist worked with the patient utilizing an in session Cognitive Behavioral Therapy exercise. The patient was responsive in the session and verbalized, " Renee Savage just been taught for so long to put family first that I do not ask the questions of what I want for me or what is good for myself".The OPT therapist worked with the patient on separating and not defining herself my combining the two thoughts of family first vs individualism.The patient indicated both compliance and effectiveness in relation to hercurrent medication therapy. The OPT therapist will continue treatment work with the patient in hernext scheduled session.  Plan: Return again in3weeks.  Diagnosis:Axis I:Generalized Anxiety Disorder andDepression, major, single episode, moderate  Axis II:No diagnosis  I discussed the assessment and treatment plan with the patient. The patient was provided an opportunity to ask questions and all were answered. The patient agreed with the plan and demonstrated an understanding of the instructions.  The patient was advised to call back or seek an in-person evaluation if the symptoms worsen or if the condition fails to improve as anticipated.  I provided79minutes of non-face-to-face time during this encounter.   [name], LCSW 12/13/2019

## 2019-12-15 ENCOUNTER — Encounter: Payer: Self-pay | Admitting: Family Medicine

## 2019-12-15 ENCOUNTER — Other Ambulatory Visit: Payer: Self-pay

## 2019-12-15 ENCOUNTER — Ambulatory Visit: Payer: No Typology Code available for payment source | Admitting: Family Medicine

## 2019-12-15 VITALS — BP 110/70 | HR 123 | Temp 98.4°F | Ht 61.0 in | Wt 148.0 lb

## 2019-12-15 DIAGNOSIS — G43009 Migraine without aura, not intractable, without status migrainosus: Secondary | ICD-10-CM

## 2019-12-15 DIAGNOSIS — G43909 Migraine, unspecified, not intractable, without status migrainosus: Secondary | ICD-10-CM | POA: Insufficient documentation

## 2019-12-15 NOTE — Progress Notes (Signed)
Patient ID: Renee Savage, female    DOB: July 22, 2000, 19 y.o.   MRN: [id number]   Chief Complaint  Patient presents with  . Migraine   Subjective:    HPIFollow up on migraines. Pt states sumatriptan is helping.    Medical History Renee Savage has a past medical history of Allergy, Angio-edema, Anxiety, Asthma, Beta thalassemia (HCC), Beta thalassemia minor, Eczema, IBS (irritable bowel syndrome), Mental disorder, Reflux, and Urticaria.   Outpatient Encounter Medications as of 12/15/2019  Medication Sig  . albuterol (PROVENTIL HFA;VENTOLIN HFA) 108 (90 Base) MCG/ACT inhaler Inhale 2 puffs into the lungs every 6 (six) hours as needed for wheezing.  Marland Kitchen escitalopram (LEXAPRO) 20 MG tablet Take 1.5 tablets (30 mg total) by mouth daily.  . hydrOXYzine (ATARAX/VISTARIL) 25 MG tablet Take 1 tablet (25 mg total) by mouth daily as needed for anxiety.  . LO LOESTRIN FE 1 MG-10 MCG / 10 MCG tablet TAKE 1 TABLET BY MOUTH  DAILY  . Multiple Vitamin (MULTIVITAMIN) tablet Take 1 tablet by mouth at bedtime.   . ondansetron (ZOFRAN ODT) 4 MG disintegrating tablet Take 1 tablet (4 mg total) by mouth every 6 (six) hours as needed for nausea or vomiting.  . sodium fluoride (PREVIDENT) 1.1 % GEL dental gel PreviDent 5000 Booster Plus 1.1 % dental paste  APPLY WITH TOOHBRUSH HS UTD  . SUMAtriptan (IMITREX) 25 MG tablet May repeat in 2 hours if headache persists or recurs (times three times).   No facility-administered encounter medications on file as of 12/15/2019.     Review of Systems  Constitutional: Negative for fever.  HENT: Negative.   Respiratory: Negative.   Cardiovascular: Negative.   Gastrointestinal: Negative.   Neurological: Positive for headaches. Negative for speech difficulty.       Has has one headache since the headache she had at the last visit.   Hematological: Negative.   Psychiatric/Behavioral: Negative.      Vitals BP 110/70   Pulse (!) 123   Temp 98.4 F (36.9 C)   Ht  5\' 1"  (1.549 m)   Wt 148 lb (67.1 kg)   SpO2 100%   BMI 27.96 kg/m   Objective:   Physical Exam Vitals and nursing note reviewed.  Constitutional:      Appearance: Normal appearance.  Cardiovascular:     Rate and Rhythm: Normal rate and regular rhythm.     Heart sounds: Normal heart sounds.  Pulmonary:     Effort: Pulmonary effort is normal.     Breath sounds: Normal breath sounds.  Skin:    General: Skin is warm and dry.  Neurological:     General: No focal deficit present.     Mental Status: She is alert and oriented to person, place, and time.     Comments: Has taken 3 sumatriptan total since last visit with excellent results.   Psychiatric:        Mood and Affect: Mood normal.        Behavior: Behavior normal.      Assessment and Plan   1. Migraine without aura and responsive to treatment   Earma presents today for follow-up for migraines without aura. She was last seen on 8/30 and was sitting in exam room with lights off at that time.  Today she is sitting comfortably with lights on and without headache. She reports on 8/30 she took two tablets of sumatriptan before her headache was relieved and has only needed one more tablet on a different day  when she felt the headache coming on.  Her mother has history of migraine and has helped her with some strategies to help prevent future headaches, such as, room darkening curtains, limiting screen time, and helping her to identify foods that may trigger the headache.  Renee Savage reports that cheddar cheese is a trigger and she is now avoiding it.  Agrees with plan of care discussed today. Understands warning signs to seek further care: increased frequency of headaches with neurological symptoms.  Understands to follow-up if symptoms do not improve or if anything changes. If the frequency increases, she may need to be on a preventative medication, but for now she seems to have made great progress.    Dorena Bodo,  FNP-C 12/15/2019

## 2019-12-15 NOTE — Patient Instructions (Signed)
Continue strategies to avoid headaches. Continue to notice how certain foods are affecting you.   Let us know if the headaches change and we need to refer  you to a specialist.    Sumatriptan tablets What is this medicine? SUMATRIPTAN (soo ma TRIP tan) is used to treat migraines with or without aura. An aura is a strange feeling or visual disturbance that warns you of an attack. It is not used to prevent migraines. This medicine may be used for other purposes; ask your health care provider or pharmacist if you have questions. COMMON BRAND NAME(S): Imitrex, Migraine Pack What should I tell my health care provider before I take this medicine? They need to know if you have any of these conditions:  cigarette smoker  circulation problems in fingers and toes  diabetes  heart disease  high blood pressure  high cholesterol  history of irregular heartbeat  history of stroke  kidney disease  liver disease  stomach or intestine problems  an unusual or allergic reaction to sumatriptan, other medicines, foods, dyes, or preservatives  pregnant or trying to get pregnant  breast-feeding How should I use this medicine? Take this medicine by mouth with a glass of water. Follow the directions on the prescription label. Do not take it more often than directed. Talk to your pediatrician regarding the use of this medicine in children. Special care may be needed. Overdosage: If you think you have taken too much of this medicine contact a poison control center or emergency room at once. NOTE: This medicine is only for you. Do not share this medicine with others. What if I miss a dose? This does not apply. This medicine is not for regular use. What may interact with this medicine? Do not take this medicine with any of the following medicines:  certain medicines for migraine headache like almotriptan, eletriptan, frovatriptan, naratriptan, rizatriptan, sumatriptan, zolmitriptan  ergot  alkaloids like dihydroergotamine, ergonovine, ergotamine, methylergonovine  MAOIs like Carbex, Eldepryl, Marplan, Nardil, and Parnate This medicine may also interact with the following medications:  certain medicines for depression, anxiety, or psychotic disorders This list may not describe all possible interactions. Give your health care provider a list of all the medicines, herbs, non-prescription drugs, or dietary supplements you use. Also tell them if you smoke, drink alcohol, or use illegal drugs. Some items may interact with your medicine. What should I watch for while using this medicine? Visit your healthcare professional for regular checks on your progress. Tell your healthcare professional if your symptoms do not start to get better or if they get worse. You may get drowsy or dizzy. Do not drive, use machinery, or do anything that needs mental alertness until you know how this medicine affects you. Do not stand up or sit up quickly, especially if you are an older patient. This reduces the risk of dizzy or fainting spells. Alcohol may interfere with the effect of this medicine. Tell your healthcare professional right away if you have any change in your eyesight. If you take migraine medicines for 10 or more days a month, your migraines may get worse. Keep a diary of headache days and medicine use. Contact your healthcare professional if your migraine attacks occur more frequently. What side effects may I notice from receiving this medicine? Side effects that you should report to your doctor or health care professional as soon as possible:  allergic reactions like skin rash, itching or hives, swelling of the face, lips, or tongue  changes in vision  chest pain or chest tightness  signs and symptoms of a dangerous change in heartbeat or heart rhythm like chest pain; dizziness; fast, irregular heartbeat; palpitations; feeling faint or lightheaded; falls; breathing problems  signs and  symptoms of a stroke like changes in vision; confusion; trouble speaking or understanding; severe headaches; sudden numbness or weakness of the face, arm or leg; trouble walking; dizziness; loss of balance or coordination  signs and symptoms of serotonin syndrome like irritable; confusion; diarrhea; fast or irregular heartbeat; muscle twitching; stiff muscles; trouble walking; sweating; high fever; seizures; chills; vomiting Side effects that usually do not require medical attention (report to your doctor or health care professional if they continue or are bothersome):  diarrhea  dizziness  drowsiness  dry mouth  headache  nausea, vomiting  pain, tingling, numbness in the hands or feet  stomach pain This list may not describe all possible side effects. Call your doctor for medical advice about side effects. You may report side effects to FDA at 1-800-FDA-1088. Where should I keep my medicine? Keep out of the reach of children. Store at room temperature between 2 and 30 degrees C (36 and 86 degrees F). Throw away any unused medicine after the expiration date. NOTE: This sheet is a summary. It may not cover all possible information. If you have questions about this medicine, talk to your doctor, pharmacist, or health care provider.  2020 Elsevier/Gold Standard (2017-10-06 15:05:37)  Recurrent Migraine Headache A migraine headache is very bad, throbbing pain that is usually on one side of your head. Recurrent migraines keep coming back (recurring). Talk with your doctor about what things may bring on (trigger) your migraine headaches. Follow these instructions at home: Medicines  Take over-the-counter and prescription medicines only as told by your doctor.  Do not drive or use heavy machinery while taking prescription pain medicine. Lifestyle  Do not use any products that contain nicotine or tobacco, such as cigarettes and e-cigarettes. If you need help quitting, ask your  doctor.  Limit alcohol intake to no more than 1 drink a day for nonpregnant women and 2 drinks a day for men. One drink equals 12 oz of beer, 5 oz of wine, or 1 oz of hard liquor.  Get 7-9 hours of sleep each night.  Lessen any stress in your life. Ask your doctor about ways to lower your stress.  Stay at a healthy weight. Talk with your doctor if you need help losing weight.  Get regular exercise. General instructions   Keep a journal to find out if certain things bring on migraine headaches. For example, write down: ? What you eat and drink. ? How much sleep you get. ? Any change to your diet or medicines.  Lie down in a dark, quiet room when you have a migraine.  Try placing a cool towel over your head when you have a migraine.  Keep lights dim if bright lights bother you or make your migraines worse.  Keep all follow-up visits as told by your doctor. This is important. Contact a doctor if:  Medicine does not help your migraines.  Your pain keeps coming back.  You have a fever.  You have weight loss without trying. Get help right away if:  Your migraine becomes really bad and medicine does not help.  You have a stiff neck.  You have trouble seeing.  Your muscles are weak or you lose control of your muscles.  You lose your balance or have trouble walking.  You  feel like you will pass out (faint) or you pass out.  You have really bad symptoms that are different than your first symptoms.  You start having sudden, very bad headaches that last for one second or less, like a thunderclap. Summary  A migraine headache is very bad, throbbing pain that is usually on one side of your head.  Talk with your doctor about what things may bring on (trigger) your migraine headaches.  Take over-the-counter and prescription medicines only as told by your doctor.  Lie down in a dark, quiet room when you have a migraine.  Keep a journal about what you eat and drink, how  much sleep you get, and any changes to your medicines. This can help you find out if certain things make you have migraine headaches. This information is not intended to replace advice given to you by your health care provider. Make sure you discuss any questions you have with your health care provider. Document Revised: 03/27/2017 Document Reviewed: 02/15/2016 Elsevier Patient Education  2020 [employer].

## 2019-12-27 ENCOUNTER — Ambulatory Visit (INDEPENDENT_AMBULATORY_CARE_PROVIDER_SITE_OTHER): Payer: No Typology Code available for payment source | Admitting: Clinical

## 2019-12-27 ENCOUNTER — Other Ambulatory Visit: Payer: Self-pay

## 2019-12-27 DIAGNOSIS — F321 Major depressive disorder, single episode, moderate: Secondary | ICD-10-CM

## 2019-12-27 DIAGNOSIS — F411 Generalized anxiety disorder: Secondary | ICD-10-CM | POA: Diagnosis not present
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
## 2020-01-10 ENCOUNTER — Ambulatory Visit (INDEPENDENT_AMBULATORY_CARE_PROVIDER_SITE_OTHER): Payer: No Typology Code available for payment source | Admitting: Clinical

## 2020-01-10 ENCOUNTER — Other Ambulatory Visit: Payer: Self-pay

## 2020-01-10 DIAGNOSIS — F411 Generalized anxiety disorder: Secondary | ICD-10-CM | POA: Diagnosis not present

## 2020-01-10 DIAGNOSIS — F321 Major depressive disorder, single episode, moderate: Secondary | ICD-10-CM

## 2020-01-10 NOTE — Progress Notes (Signed)
Virtual Visit via Video Note  I connected withCatherine A Savage 10/05/21at3:00PM EDTby a video enabled telemedicine application and verified that I am speaking with the correct person using two identifiers.  Location: Patient:Home Provider:Office  I discussed the limitations of evaluation and management by telemedicine and the availability of in person appointments. The patient expressed understanding and agreed to proceed.    THERAPIST PROGRESS NOTE  Session Time:3:00PM-3:45PM  Participation Level:Active  Behavioral Response:CasualAlertAnxious  Type of Therapy:Individual Therapy  Treatment Goals addressed:Anxiety  Interventions:CBT, DBT, Motivational Interviewing, Solution Focused, Strength-based and Supportive  Summary:Hibah A Kopfis a 19 y.o.femalewho presents with Depression and Anxiety.The OPT therapist worked with thepatientfor herongoingOPT treatment. The OPT therapist utilized Motivational Interviewing to assist in creating therapeutic repore. The patient in the session was engaged and work in Tour manager about hertriggers and symptoms over the past few weeksincludingthe complexity of her home dynamics and the struggle between trying to become independent, but still living at home. The OPT therapist utilized Cognitive Behavioral Therapy through cognitive restructuring as well as worked with the patient on coping strategies to assist in management ofanxiety andmood.The OPT therapist continued to work with the patient on the importance of managing her basics including eating, sleeping, and hygiene/health care.The OPT therapist continued to overview the importance of time managementbetween caregiving andleisure/socializationbalance and continued to suggest finding a social club or outlet.  Suicidal/Homicidal:Nowithout intent/plan  Therapist Response:The OPT therapist worked with the patient for the patients  scheduled session. The patient was engaged in hersession and gave feedback in relation to triggers, symptoms, and behavior responses over the pastfewweeks. The OPT therapist worked with the patient utilizing an in session Cognitive Behavioral Therapy exercise. The patient was responsive in the session and verbalized, " I decided to drop one of my college courses and focus more on my leisure time and this has been very helpful".The OPT therapist worked with the patient on managing her symptoms/stressors, time management, and increasing socialization/leisure.The patient indicated both compliance and effectiveness in relation to hercurrent medication therapy. The OPT therapist will continue treatment work with the patient in hernext scheduled session.  Plan: Return again in3weeks.  Diagnosis:Axis I:Generalized Anxiety Disorder andDepression, major, single episode, moderate  Axis II:No diagnosis  I discussed the assessment and treatment plan with the patient. The patient was provided an opportunity to ask questions and all were answered. The patient agreed with the plan and demonstrated an understanding of the instructions.  The patient was advised to call back or seek an in-person evaluation if the symptoms worsen or if the condition fails to improve as anticipated.  I provided13minutes of non-face-to-face time during this encounter.   [name], LCSW 01/10/2020

## 2020-01-12 ENCOUNTER — Telehealth: Payer: Self-pay

## 2020-01-12 NOTE — Telephone Encounter (Signed)
Patient said that Renee Savage wanted her to call back when she ran out of headache medicine, not for a refill but to see how long the presciption lasted.  Also to see if she needed a referral to a headache specialist.  Patient wanted to talk with the nurse about this.
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
## 2020-01-13 NOTE — Telephone Encounter (Signed)
Pt contacted and verbalized understanding. Pt transferred up front to set up appt  

## 2020-01-17 ENCOUNTER — Encounter: Payer: Self-pay | Admitting: Family Medicine

## 2020-01-17 ENCOUNTER — Ambulatory Visit (INDEPENDENT_AMBULATORY_CARE_PROVIDER_SITE_OTHER): Payer: No Typology Code available for payment source | Admitting: Family Medicine

## 2020-01-17 VITALS — BP 108/78 | HR 104 | Temp 98.3°F | Wt 143.6 lb

## 2020-01-17 DIAGNOSIS — G43009 Migraine without aura, not intractable, without status migrainosus: Secondary | ICD-10-CM | POA: Diagnosis not present

## 2020-01-17 DIAGNOSIS — Z23 Encounter for immunization: Secondary | ICD-10-CM

## 2020-01-17 MED ORDER — SUMATRIPTAN SUCCINATE 50 MG PO TABS: ORAL_TABLET | ORAL | 0 refills | Status: DC

## 2020-01-17 MED ORDER — ONDANSETRON 4 MG PO TBDP: 4.0000 mg | ORAL_TABLET | Freq: Four times a day (QID) | ORAL | 0 refills | Status: DC | PRN
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
## 2020-01-17 NOTE — Patient Instructions (Signed)

## 2020-01-31 ENCOUNTER — Ambulatory Visit (HOSPITAL_COMMUNITY): Payer: No Typology Code available for payment source | Admitting: Clinical

## 2020-01-31 ENCOUNTER — Other Ambulatory Visit: Payer: Self-pay

## 2020-02-14 ENCOUNTER — Telehealth (HOSPITAL_COMMUNITY): Payer: Self-pay | Admitting: Psychiatry

## 2020-02-14 NOTE — Telephone Encounter (Signed)
Called pt to schedule f/u appt, left detailed vm

## 2020-02-17 ENCOUNTER — Ambulatory Visit (INDEPENDENT_AMBULATORY_CARE_PROVIDER_SITE_OTHER): Payer: No Typology Code available for payment source | Admitting: Family Medicine

## 2020-02-17 ENCOUNTER — Encounter: Payer: Self-pay | Admitting: Family Medicine

## 2020-02-17 VITALS — BP 118/76 | HR 97 | Temp 97.8°F | Ht 61.0 in | Wt 143.0 lb

## 2020-02-17 DIAGNOSIS — G43009 Migraine without aura, not intractable, without status migrainosus: Secondary | ICD-10-CM

## 2020-02-17 DIAGNOSIS — F339 Major depressive disorder, recurrent, unspecified: Secondary | ICD-10-CM | POA: Diagnosis not present

## 2020-02-17 DIAGNOSIS — F419 Anxiety disorder, unspecified: Secondary | ICD-10-CM

## 2020-02-17 NOTE — Progress Notes (Signed)
Patient ID: Renee Savage, female    DOB: [birth year], 19 y.o.   MRN: [id number]   Chief Complaint  Patient presents with  . Migraine    follow up- doing ok- trying to determine triggers   Subjective:  CC: follow-up for migraines  Presents today for follow-up for migraines.  Reports that she has had 2 migraines since our last visit.  1 pill helped both times, she went to sleep, avoided her triggers and this helped.  She is under a lot of stress, as she is a Consulting civil engineer at [employer].  It is difficult to avoid the stressors of being on the computer, but she now is using bluelight blocker glasses.  Other stressors include a particular teacher, cheddar cheese, and another trigger that she has failed to identify.  She sees Dr. Tenny Craw with psychiatry who manages her anxiety and depression.  She also sees a Veterinary surgeon, who she says she is difficult to connect with.  She denies any thoughts of self-harm.  It appears that she has a stressful family situation as well.  She is dependent upon a lot by her parents.    Medical History Renee Savage has a past medical history of Allergy, Angio-edema, Anxiety, Asthma, Beta thalassemia (HCC), Beta thalassemia minor, Eczema, IBS (irritable bowel syndrome), Mental disorder, Reflux, and Urticaria.   Outpatient Encounter Medications as of 02/17/2020  Medication Sig  . albuterol (PROVENTIL HFA;VENTOLIN HFA) 108 (90 Base) MCG/ACT inhaler Inhale 2 puffs into the lungs every 6 (six) hours as needed for wheezing.  Marland Kitchen escitalopram (LEXAPRO) 20 MG tablet Take 1.5 tablets (30 mg total) by mouth daily.  . hydrOXYzine (ATARAX/VISTARIL) 25 MG tablet Take 1 tablet (25 mg total) by mouth daily as needed for anxiety.  . LO LOESTRIN FE 1 MG-10 MCG / 10 MCG tablet TAKE 1 TABLET BY MOUTH  DAILY  . Multiple Vitamin (MULTIVITAMIN) tablet Take 1 tablet by mouth at bedtime.   . ondansetron (ZOFRAN ODT) 4 MG disintegrating tablet Take 1 tablet (4 mg total) by mouth every 6 (six) hours as needed  for nausea or vomiting.  . sodium fluoride (PREVIDENT) 1.1 % GEL dental gel PreviDent 5000 Booster Plus 1.1 % dental paste  APPLY WITH TOOHBRUSH HS UTD  . SUMAtriptan (IMITREX) 50 MG tablet May repeat in 2 hours if headache persists or recurs one time.   No facility-administered encounter medications on file as of 02/17/2020.     Review of Systems  Constitutional: Negative for chills and fever.  Respiratory: Negative for shortness of breath.   Cardiovascular: Negative for chest pain.  Gastrointestinal: Positive for abdominal pain.       X1 yesterday     Vitals BP 118/76   Pulse 97   Temp 97.8 F (36.6 C) (Oral)   Ht 5\' 1"  (1.549 m)   Wt 143 lb (64.9 kg)   SpO2 98%   BMI 27.02 kg/m   Objective:   Physical Exam Vitals and nursing note reviewed.  Constitutional:      Appearance: Normal appearance.  Cardiovascular:     Rate and Rhythm: Normal rate and regular rhythm.     Heart sounds: Normal heart sounds.  Pulmonary:     Effort: Pulmonary effort is normal.     Breath sounds: Normal breath sounds.  Skin:    General: Skin is warm and dry.  Neurological:     Mental Status: She is alert and oriented to person, place, and time.  Psychiatric:     Comments: Feels her  current medication for depression/anxiety is not effective.  Dr. Tenny Craw, psychiatry manages this medication PHQ-9: 16 GAD-7: 17  Denies thoughts of self-harm, admits to hitting herself 1 time.      Assessment and Plan   1. Anxiety - Ambulatory referral to Psychiatry  2. Depression, recurrent (HCC) - Ambulatory referral to Psychiatry  3. Migraine without aura and without status migrainosus, not intractable   She appears to be having adequate control with her migraine headaches.  She has successfully identified stressors, and is avoiding those when she can.  She does not need any refills on her Imitrex today.  She only has 1 more month with a particular teacher that is causing a lot of stress in her  life.  We will attempt to notify Dr. Tenny Craw, to see if she can have an appointment soon.  Renee Savage will notify her counselor for another session soon.  I encouraged her to practice setting boundaries with her family members.  Agrees with plan of care discussed today. Understands warning signs to seek further care: Thoughts of self-harm. Understands to follow-up in 6 months for migraines, sooner if anything changes.  We will attempt to expedite an appointment with Dr. Tenny Craw, she feels that she needs to change her SSRI as it is no longer effective.  She understands that Dr. Tenny Craw needs to manage this.  Information given for the [facility] behavioral health urgent care.  Renee Bodo, FNP-C

## 2020-02-17 NOTE — Patient Instructions (Addendum)
[facility] [place], [place]  Open 24/7 No appointment necessary  Walk for 30 minutes per day is the goal for physical and mental health.   Stress, Adult Stress is a normal reaction to life events. Stress is what you feel when life demands more than you are used to, or more than you think you can handle. Some stress can be useful, such as studying for a test or meeting a deadline at work. Stress that occurs too often or for too long can cause problems. It can affect your emotional health and interfere with relationships and normal daily activities. Too much stress can weaken your body's defense system (immune system) and increase your risk for physical illness. If you already have a medical problem, stress can make it worse. What are the causes? All sorts of life events can cause stress. An event that causes stress for one person may not be stressful for another person. Major life events, whether positive or negative, commonly cause stress. Examples include:  Losing a job or starting a new job.  Losing a loved one.  Moving to a new town or home.  Getting married or divorced.  Having a baby.  Getting injured or sick. Less obvious life events can also cause stress, especially if they occur day after day or in combination with each other. Examples include:  Working long hours.  Driving in traffic.  Caring for children.  Being in debt.  Being in a difficult relationship. What are the signs or symptoms? Stress can cause emotional symptoms, including:  Anxiety. This is feeling worried, afraid, on edge, overwhelmed, or out of control.  Anger, including irritation or impatience.  Depression. This is feeling sad, down, helpless, or guilty.  Trouble focusing, remembering, or making decisions. Stress can cause physical symptoms, including:  Aches and pains. These may affect your head, neck, back, stomach, or other areas of your  body.  Tight muscles or a clenched jaw.  Low energy.  Trouble sleeping. Stress can cause unhealthy behaviors, including:  Eating to feel better (overeating) or skipping meals.  Working too much or putting off tasks.  Smoking, drinking alcohol, or using drugs to feel better. How is this diagnosed? Stress is diagnosed through an assessment by your health care provider. He or she may diagnose this condition based on:  Your symptoms and any stressful life events.  Your medical history.  Tests to rule out other causes of your symptoms. Depending on your condition, your health care provider may refer you to a specialist for further evaluation. How is this treated?  Stress management techniques are the recommended treatment for stress. Medicine is not typically recommended for the treatment of stress. Techniques to reduce your reaction to stressful life events include:  Stress identification. Monitor yourself for symptoms of stress and identify what causes stress for you. These skills may help you to avoid or prepare for stressful events.  Time management. Set your priorities, keep a calendar of events, and learn to say no. Taking these actions can help you avoid making too many commitments. Techniques for coping with stress include:  Rethinking the problem. Try to think realistically about stressful events rather than ignoring them or overreacting. Try to find the positives in a stressful situation rather than focusing on the negatives.  Exercise. Physical exercise can release both physical and emotional tension. The key is to find a form of exercise that you enjoy and do it regularly.  Relaxation techniques. These  relax the body and mind. The key is to find one or more that you enjoy and use the techniques regularly. Examples include: ? Meditation, deep breathing, or progressive relaxation techniques. ? Yoga or tai chi. ? Biofeedback, mindfulness techniques, or  journaling. ? Listening to music, being out in nature, or participating in other hobbies.  Practicing a healthy lifestyle. Eat a balanced diet, drink plenty of water, limit or avoid caffeine, and get plenty of sleep.  Having a strong support network. Spend time with family, friends, or other people you enjoy being around. Express your feelings and talk things over with someone you trust. Counseling or talk therapy with a mental health professional may be helpful if you are having trouble managing stress on your own. Follow these instructions at home: Lifestyle   Avoid drugs.  Do not use any products that contain nicotine or tobacco, such as cigarettes, e-cigarettes, and chewing tobacco. If you need help quitting, ask your health care provider.  Limit alcohol intake to no more than 1 drink a day for nonpregnant women and 2 drinks a day for men. One drink equals 12 oz of beer, 5 oz of wine, or 1 oz of hard liquor  Do not use alcohol or drugs to relax.  Eat a balanced diet that includes fresh fruits and vegetables, whole grains, lean meats, fish, eggs, and beans, and low-fat dairy. Avoid processed foods and foods high in added fat, sugar, and salt.  Exercise at least 30 minutes on 5 or more days each week.  Get 7-8 hours of sleep each night. General instructions   Practice stress management techniques as discussed with your health care provider.  Drink enough fluid to keep your urine clear or pale yellow.  Take over-the-counter and prescription medicines only as told by your health care provider.  Keep all follow-up visits as told by your health care provider. This is important. Contact a health care provider if:  Your symptoms get worse.  You have new symptoms.  You feel overwhelmed by your problems and can no longer manage them on your own. Get help right away if:  You have thoughts of hurting yourself or others. If you ever feel like you may hurt yourself or others, or  have thoughts about taking your own life, get help right away. You can go to your nearest emergency department or call:  Your local emergency services (911 in the [place]  A suicide crisis helpline, such as the [place] at 530-525-2543. This is open 24 hours a day. Summary  Stress is a normal reaction to life events. It can cause problems if it happens too often or for too long.  Practicing stress management techniques is the best way to treat stress.  Counseling or talk therapy with a mental health professional may be helpful if you are having trouble managing stress on your own. This information is not intended to replace advice given to you by your health care provider. Make sure you discuss any questions you have with your health care provider. Document Revised: 10/22/2018 Document Reviewed: 05/14/2016 Elsevier Patient Education  [place].

## 2020-02-19 DIAGNOSIS — F339 Major depressive disorder, recurrent, unspecified: Secondary | ICD-10-CM | POA: Insufficient documentation

## 2020-02-19 DIAGNOSIS — F419 Anxiety disorder, unspecified: Secondary | ICD-10-CM | POA: Insufficient documentation

## 2020-02-22 ENCOUNTER — Other Ambulatory Visit: Payer: Self-pay

## 2020-02-22 ENCOUNTER — Ambulatory Visit (INDEPENDENT_AMBULATORY_CARE_PROVIDER_SITE_OTHER): Payer: No Typology Code available for payment source | Admitting: Clinical

## 2020-02-22 DIAGNOSIS — F411 Generalized anxiety disorder: Secondary | ICD-10-CM | POA: Diagnosis not present

## 2020-02-22 DIAGNOSIS — F321 Major depressive disorder, single episode, moderate: Secondary | ICD-10-CM | POA: Diagnosis not present

## 2020-02-22 NOTE — Progress Notes (Signed)
Virtual Visit via Telephone Note  I connected with Renee Savage on 02/22/20 at  2:00 PM EST by telephone and verified that I am speaking with the correct person using two identifiers.  Location: Patient: Home Provider: Office   I discussed the limitations, risks, security and privacy concerns of performing an evaluation and management service by telephone and the availability of in person appointments. I also discussed with the patient that there may be a patient responsible charge related to this service. The patient expressed understanding and agreed to proceed.    THERAPIST PROGRESS NOTE  Session Time:2:00PM-2:45PM  Participation Level:Active  Behavioral Response:CasualAlertAnxious  Type of Therapy:Individual Therapy  Treatment Goals addressed:Anxiety  Interventions:CBT, DBT, Motivational Interviewing, Solution Focused, Strength-based and Supportive  Summary:Renee A Kopfis a 19 y.o.femalewho presents with Depression and Anxiety.The OPT therapist worked with thepatientfor herongoingOPT treatment. The OPT therapist utilized Motivational Interviewing to assist in creating therapeutic repore. The patient in the session was engaged and work in Tour manager about hertriggers and symptoms over the past few weeksincludingin home conflict between other family members, extra stress from having more responsibilities as her parents are currently recovering from medical problems, and upcoming exams. The OPT therapist utilized Cognitive Behavioral Therapy through cognitive restructuring as well as worked with the patient on coping strategies to assist in management ofanxiety andmood.The OPT therapist continued to work with the patient on the importance of managing her basics including eating, sleeping, and hygiene/health care.The OPT therapist continued to overview the importance of time managementbetween caregiving  andleisure/socializationbalanceand continued to suggest finding a social club or outlet.  Suicidal/Homicidal:Nowithout intent/plan  Therapist Response:The OPT therapist worked with the patient for the patients scheduled session. The patient was engaged in hersession and gave feedback in relation to triggers, symptoms, and behavior responses over the pastfewweeks. The OPT therapist worked with the patient utilizing an in session Cognitive Behavioral Therapy exercise. The patient was responsive in the session and verbalized, "I have been working to give myself some space from my house and family and I have applied for a internshipl".The OPT therapist worked with the patienton managing her symptoms/stressors, time management, and increasing socialization/leisure.The patient indicated  compliance but difficulty with effectiveness in relation to hercurrent medication therapy and plans to review this with her prescriber during her next session schedule for tomorrow. The OPT therapist will continue treatment work with the patient in hernext scheduled session.  Plan: Return again in3weeks.  Diagnosis:Axis I:Generalized Anxiety Disorder andDepression, major, single episode, moderate  Axis II:No diagnosis  I discussed the assessment and treatment plan with the patient. The patient was provided an opportunity to ask questions and all were answered. The patient agreed with the plan and demonstrated an understanding of the instructions.  The patient was advised to call back or seek an in-person evaluation if the symptoms worsen or if the condition fails to improve as anticipated.  I provided73minutes of non-face-to-face time during this encounter.   [name], LCSW 02/22/2020

## 2020-02-23 ENCOUNTER — Encounter (HOSPITAL_COMMUNITY): Payer: Self-pay | Admitting: Psychiatry

## 2020-02-23 ENCOUNTER — Other Ambulatory Visit: Payer: Self-pay

## 2020-02-23 ENCOUNTER — Telehealth (INDEPENDENT_AMBULATORY_CARE_PROVIDER_SITE_OTHER): Payer: No Typology Code available for payment source | Admitting: Psychiatry

## 2020-02-23 DIAGNOSIS — F321 Major depressive disorder, single episode, moderate: Secondary | ICD-10-CM | POA: Diagnosis not present

## 2020-02-23 DIAGNOSIS — F411 Generalized anxiety disorder: Secondary | ICD-10-CM | POA: Diagnosis not present

## 2020-02-23 MED ORDER — ESCITALOPRAM OXALATE 20 MG PO TABS
40.0000 mg | ORAL_TABLET | Freq: Every day | ORAL | 3 refills | Status: DC
Start: 2020-02-23 — End: 2020-04-03

## 2020-02-23 NOTE — Progress Notes (Signed)
Virtual Visit via Telephone Note  I connected with Renee Savage on 02/23/20 at  1:20 PM EST by telephone and verified that I am speaking with the correct person using two identifiers.  Location: Patient: home Provider: office   I discussed the limitations, risks, security and privacy concerns of performing an evaluation and management service by telephone and the availability of in person appointments. I also discussed with the patient that there may be a patient responsible charge related to this service. The patient expressed understanding and agreed to proceed.     I discussed the assessment and treatment plan with the patient. The patient was provided an opportunity to ask questions and all were answered. The patient agreed with the plan and demonstrated an understanding of the instructions.   The patient was advised to call back or seek an in-person evaluation if the symptoms worsen or if the condition fails to improve as anticipated.  I provided 15 minutes of non-face-to-face time during this encounter.   [name], MD  [facility] MD/PA/NP OP Progress Note  02/23/2020 1:46 PM Renee Savage  MRN:  [id number]  Chief Complaint:  Chief Complaint    Depression; Anxiety; Follow-up     HPI: This patient is a55 year old single white female who lives at home with her parents and 48 year old brother. 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  The patient was referred by her primary physician, [name] for further assessment and treatment of anxiety.  The patient states that she has been anxious for a number of years. Some of this started in middle school because she was being bullied. She also has a very contentious relationship with her father. She states that he tries to control everyone in the family but particularly her. They are always butting heads. She states that he comes from a "military family "and tends to be harsh. He is very critical of her  appearance, her relationships, her major in school. He is also constantly criticizing little things like the timing of when she pays her tuition, how she does things around the house etc. By her report he is very harsh and judgmental. He pushed her constantly to go into the same feel that he is-speech therapy even though she does not want to. He is very against her living on campus because of the cost.  The patient states over the last few months she has been extremely irritable, crying at the drop of a hat anxious and worried. She tends to obsess about things and expect the worst. She is very self-critical. She was dating a boy last fall and he broke up with her in December and this caused some of the symptoms to intensify. She has never had previous psychiatric treatment because her father does not believe in mental health treatment. She does have a lot of GI issues such as stomachaches and diarrhea which Dr. [name] thinks are related to stress. She is not able to sleep well and is constantly wakes up through the night. She has had some panic attacks particularly during arguments with dad. She denies being suicidal or engaging in any form of self-harm. She does not use alcohol drugs cigarettes or vaping. She is never been sexually active. She and her mother are very close and she is also close to her brother. She seems extremely managed in the family, trying to protect her brother from the father and also trying to make up for her mother's chronic headaches by being a "second  mom"to brother who picks him up at school etc  The patient returns for follow-up after 3 months.  She states she has been very stressed lately.  She has had a difficult course load and one particular teacher who has been very critical of her writing.  She is also continuing to have to drive her mother around because her mother's arm is still not totally healed from surgery.  Her father works a lot and can help and her  brother does not have a Information systems manager.  She states that her brother and father are constantly arguing and causing a lot of stress in the family.  Her headaches have worsened and during her last family medicine visit she described more depression.  I suggested that we go up on the Lexapro and I also strongly suggested that she set limits with what she is able to do for her family.  I reminded her that her main job right now is to complete college.  She denies suicidal ideation and is sleeping well most of the time but sometimes cannot sleep.  I reminded her to use the hydroxyzine when she cannot sleep. Visit Diagnosis:    ICD-10-CM   1. Generalized anxiety disorder  F41.1   2. Depression, major, single episode, moderate (HCC)  F32.1     Past Psychiatric History: none  Past Medical History:  Past Medical History:  Diagnosis Date  . Allergy    Chronic  . Angio-edema   . Anxiety   . Asthma   . Beta thalassemia (HCC)   . Beta thalassemia minor   . Eczema   . IBS (irritable bowel syndrome)   . Mental disorder   . Reflux   . Urticaria     Past Surgical History:  Procedure Laterality Date  . CYST REMOVAL HAND    . KNEE SURGERY Left 03/16/2019  . WISDOM TOOTH EXTRACTION  2016    Family Psychiatric History: see below  Family History:  Family History  Problem Relation Age of Onset  . Anxiety disorder Mother   . Depression Mother   . Depression Father   . Anxiety disorder Brother   . Depression Brother   . Depression Paternal Aunt   . Bipolar disorder Other     Social History:  Social History   Socioeconomic History  . Marital status: Single    Spouse name: Not on file  . Number of children: Not on file  . Years of education: Not on file  . Highest education level: Not on file  Occupational History  . Not on file  Tobacco Use  . Smoking status: Never Smoker  . Smokeless tobacco: Never Used  Vaping Use  . Vaping Use: Never used  Substance and Sexual Activity  .  Alcohol use: No  . Drug use: No  . Sexual activity: Never    Birth control/protection: Pill  Other Topics Concern  . Not on file  Social History Narrative  . Not on file   Social Determinants of Health   Financial Resource Strain: Low Risk   . Difficulty of Paying Living Expenses: Not hard at all  Food Insecurity: No Food Insecurity  . Worried About Programme researcher, broadcasting/film/video in the Last Year: Never true  . Ran Out of Food in the Last Year: Never true  Transportation Needs: No Transportation Needs  . Lack of Transportation (Medical): No  . Lack of Transportation (Non-Medical): No  Physical Activity: Insufficiently Active  . Days of Exercise per Week:  2 days  . Minutes of Exercise per Session: 20 min  Stress: Stress Concern Present  . Feeling of Stress : Very much  Social Connections: Moderately Integrated  . Frequency of Communication with Friends and Family: Once a week  . Frequency of Social Gatherings with Friends and Family: More than three times a week  . Attends Religious Services: More than 4 times per year  . Active Member of Clubs or Organizations: Yes  . Attends Banker Meetings: 1 to 4 times per year  . Marital Status: Never married    Allergies:  Allergies  Allergen Reactions  . Amoxil [Amoxicillin] Nausea And Vomiting    Dizziness, light headed, loopy  . Cefzil [Cefprozil] Nausea And Vomiting    Vomiting (as an baby)  . Orange Oil Hives    Metabolic Disorder Labs: No results found for: HGBA1C, MPG No results found for: PROLACTIN No results found for: CHOL, TRIG, HDL, CHOLHDL, VLDL, LDLCALC Lab Results  Component Value Date   TSH 1.490 07/14/2019    Therapeutic Level Labs: No results found for: LITHIUM No results found for: VALPROATE No components found for:  CBMZ  Current Medications: Current Outpatient Medications  Medication Sig Dispense Refill  . albuterol (PROVENTIL HFA;VENTOLIN HFA) 108 (90 Base) MCG/ACT inhaler Inhale 2 puffs into  the lungs every 6 (six) hours as needed for wheezing. 1 Inhaler 2  . escitalopram (LEXAPRO) 20 MG tablet Take 2 tablets (40 mg total) by mouth daily. 180 tablet 3  . hydrOXYzine (ATARAX/VISTARIL) 25 MG tablet Take 1 tablet (25 mg total) by mouth daily as needed for anxiety. 90 tablet 2  . LO LOESTRIN FE 1 MG-10 MCG / 10 MCG tablet TAKE 1 TABLET BY MOUTH  DAILY 84 tablet 3  . Multiple Vitamin (MULTIVITAMIN) tablet Take 1 tablet by mouth at bedtime.     . ondansetron (ZOFRAN ODT) 4 MG disintegrating tablet Take 1 tablet (4 mg total) by mouth every 6 (six) hours as needed for nausea or vomiting. 20 tablet 0  . sodium fluoride (PREVIDENT) 1.1 % GEL dental gel PreviDent 5000 Booster Plus 1.1 % dental paste  APPLY WITH TOOHBRUSH HS UTD    . SUMAtriptan (IMITREX) 50 MG tablet May repeat in 2 hours if headache persists or recurs one time. 30 tablet 0   No current facility-administered medications for this visit.     Musculoskeletal: Strength & Muscle Tone: within normal limits Gait & Station: normal Patient leans: N/A  Psychiatric Specialty Exam: Review of Systems  Neurological: Positive for headaches.  Psychiatric/Behavioral: Positive for dysphoric mood. The patient is nervous/anxious.   All other systems reviewed and are negative.   There were no vitals taken for this visit.There is no height or weight on file to calculate BMI.  General Appearance: NA  Eye Contact:  NA  Speech:  Clear and Coherent  Volume:  Normal  Mood:  Anxious and Dysphoric  Affect:  NA  Thought Process:  Goal Directed  Orientation:  Full (Time, Place, and Person)  Thought Content: Rumination   Suicidal Thoughts:  No  Homicidal Thoughts:  No  Memory:  Immediate;   Good Recent;   Good Remote;   Good  Judgement:  Good  Insight:  Fair  Psychomotor Activity:  Decreased  Concentration:  Concentration: Good and Attention Span: Good  Recall:  Good  Fund of Knowledge: Good  Language: Good  Akathisia:  No  Handed:   Right  AIMS (if indicated): not done  Assets:  Communication Skills Desire for Improvement Physical Health Resilience Social Support Talents/Skills Vocational/Educational  ADL's:  Intact  Cognition: WNL  Sleep:  Fair   Screenings: GAD-7     [facility] Visit [facility] 02[facility]17/2020 in [facility] 10[facility]16/2017 in [facility]  Total GAD-7 Score 17 13 16     PHQ2-9     [facility]/2020 [facility]/2020 [facility] 11/05/2018 in [facility]  PHQ-2 Total Score 3 3 1 1   PHQ-9 Total Score 17 16 15 13        Assessment and Plan: This patient is a 19 year old female with a history of depression and anxiety.  She is under a lot of stress due to the family dysfunction and unrealistic expectations of her ability to help family members.  I again urged her to set limits with them.  We will increase Lexapro to 40 mg daily for depression anxiety and encouraged her to use hydroxyzine 25 mg at bedtime if she cannot sleep.  She will return to see me in 6 weeks   [name] Emmakate Hypes, MD 02/23/2020, 1:46 PM

## 2020-03-16 ENCOUNTER — Ambulatory Visit (INDEPENDENT_AMBULATORY_CARE_PROVIDER_SITE_OTHER): Payer: No Typology Code available for payment source | Admitting: Clinical

## 2020-03-16 ENCOUNTER — Telehealth (HOSPITAL_COMMUNITY): Payer: Self-pay | Admitting: *Deleted

## 2020-03-16 ENCOUNTER — Other Ambulatory Visit: Payer: Self-pay

## 2020-03-16 DIAGNOSIS — F321 Major depressive disorder, single episode, moderate: Secondary | ICD-10-CM | POA: Diagnosis not present

## 2020-03-16 DIAGNOSIS — F411 Generalized anxiety disorder: Secondary | ICD-10-CM

## 2020-03-16 NOTE — Telephone Encounter (Signed)
noted 

## 2020-03-16 NOTE — Telephone Encounter (Signed)
Patients mother called, patient verified and gave permission to speak with mother (MS. Licet Dunphy [phone]), mother stated she was concerned with providers professionalism based on session /conversation with patient. Parent is requesting a call from provider based on session with patient. Patient has given verbal permission to speak with mother, mother was on speaker with patient when parent called in.

## 2020-03-16 NOTE — Progress Notes (Signed)
Virtual Visit via Telephone Note  I connected withCatherine A Savage on 03/16/20 at  10:00 AM EST by telephoneand verified that I am speaking with the correct person using two identifiers.  Location: Patient: Home Provider: Office  I discussed the limitations, risks, security and privacy concerns of performing an evaluation and management service by telephone and the availability of in person appointments. I also discussed with the patient that there may be a patient responsible charge related to this service. The patient expressed understanding and agreed to proceed.    THERAPIST PROGRESS NOTE  Session Time:10:00AM-10:40AM  Participation Level:Active  Behavioral Response:CasualAlertAnxious  Type of Therapy:Individual Therapy  Treatment Goals addressed:Anxiety  Interventions:CBT, DBT, Motivational Interviewing, Solution Focused, Strength-based and Supportive  Summary:Renee A Kopfis a 19 y.o.femalewho presents with Depression and Anxiety.The OPT therapist worked with thepatientfor herongoingOPT treatment. The OPT therapist utilized Motivational Interviewing to assist in creating therapeutic repore. The patient in the session was engaged and work in Tour manager about hertriggers and symptoms over the past few weeksincludingongoing struggle for independence balanced with the demands placed on her by her family and restrictions not allowing many opportunities to be independant. The OPT therapist utilized Cognitive Behavioral Therapy through cognitive restructuring as well as worked with the patient on coping strategies to assist in management ofanxiety andmood.The OPT therapist continued to work with the patient on the importance of managing her basics including eating, sleeping, and hygiene/health care.The OPT therapist continued to overview the importance of time managementbetween caregiving andleisure/socializationbalanceand  continued to suggest finding a social club or outlet.  Suicidal/Homicidal:Nowithout intent/plan  Therapist Response:The OPT therapist worked with the patient for the patients scheduled session. The patient was engaged in hersession and gave feedback in relation to triggers, symptoms, and behavior responses over the pastfewweeks. The OPT therapist worked with the patient utilizing an in session Cognitive Behavioral Therapy exercise. 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 The OPT therapist will continue treatment work with the patient in hernext scheduled session.  Plan: Return again in3weeks.  Diagnosis:Axis I:Generalized Anxiety Disorder andDepression, major, single episode, moderate  Axis II:No diagnosis  I discussed the assessment and treatment plan with the patient. The patient was provided an opportunity to ask questions and all were answered. The patient agreed with the plan and demonstrated an understanding of the instructions.  The patient was advised to call back or seek an in-person evaluation if the symptoms worsen or if the condition fails to improve as anticipated.  I provided43minutes of non-face-to-face time during this encounter.  Renee Burn, LCSW 03/16/2020

## 2020-03-16 NOTE — Telephone Encounter (Signed)
Patient mother called, patient verified and gave permission to speak with mother (MS. Barbarann Kelly [phone]), mother stated she was concerned with providers professionalism based on session /conversation with patient. Parent is requesting a call from provider based on session with patient. Patient has given verbal permission to speak with mother, mother was on speaker with patient when parent called in.

## 2020-04-03 ENCOUNTER — Telehealth (INDEPENDENT_AMBULATORY_CARE_PROVIDER_SITE_OTHER): Payer: No Typology Code available for payment source | Admitting: Psychiatry

## 2020-04-03 ENCOUNTER — Encounter (HOSPITAL_COMMUNITY): Payer: Self-pay | Admitting: Psychiatry

## 2020-04-03 ENCOUNTER — Other Ambulatory Visit: Payer: Self-pay

## 2020-04-03 DIAGNOSIS — F411 Generalized anxiety disorder: Secondary | ICD-10-CM

## 2020-04-03 DIAGNOSIS — F321 Major depressive disorder, single episode, moderate: Secondary | ICD-10-CM | POA: Diagnosis not present

## 2020-04-03 MED ORDER — ESCITALOPRAM OXALATE 20 MG PO TABS
40.0000 mg | ORAL_TABLET | Freq: Every day | ORAL | 3 refills | Status: DC
Start: 2020-04-03 — End: 2020-07-02

## 2020-04-03 MED ORDER — HYDROXYZINE HCL 25 MG PO TABS: 25.0000 mg | ORAL_TABLET | Freq: Every day | ORAL | 2 refills | Status: DC | PRN

## 2020-04-03 NOTE — Progress Notes (Signed)
Virtual Visit via Video Note  I connected with Renee Savage on 04/03/20 at 11:20 AM EST by a video enabled telemedicine application and verified that I am speaking with the correct person using two identifiers.  Location: Patient: home Provider: office   I discussed the limitations of evaluation and management by telemedicine and the availability of in person appointments. The patient expressed understanding and agreed to proceed   I discussed the assessment and treatment plan with the patient. The patient was provided an opportunity to ask questions and all were answered. The patient agreed with the plan and demonstrated an understanding of the instructions.   The patient was advised to call back or seek an in-person evaluation if the symptoms worsen or if the condition fails to improve as anticipated.  I provided 15 minutes of non-face-to-face time during this encounter.   Renee Savage Jacelyn Cuen, MD  [facility] OP Progress Note  04/03/2020 11:49 AM Renee Basemanatherine A Chokshi  MRN:  [id number]  Chief Complaint:  Chief Complaint    Anxiety; Depression; Follow-up     HPI: This patient is a[age] year old single white female who lives at home with her parents and 21 year old brother. 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  The patient was referred by her primary physician, Dr. Lilyan PuntScott Savage for further assessment and treatment of anxiety.  The patient states that she has been anxious for a number of years. Some of this started in middle school because she was being bullied. She also has a very contentious relationship with her father. She states that he tries to control everyone in the family but particularly her. They are always butting heads. She states that he comes from a "military family "and tends to be harsh. He is very critical of her appearance, her relationships, her major in school. He is also constantly criticizing little things like the timing of when she pays her tuition,  how she does things around the house etc. By her report he is very harsh and judgmental. He pushed her constantly to go into the same feel that he is-speech therapy even though she does not want to. He is very against her living on campus because of the cost.  The patient states over the last few months she has been extremely irritable, crying at the drop of a hat anxious and worried. She tends to obsess about things and expect the worst. She is very self-critical. She was dating a boy last fall and he broke up with her in December and this caused some of the symptoms to intensify. She has never had previous psychiatric treatment because her father does not believe in mental health treatment. She does have a lot of GI issues such as stomachaches and diarrhea which Dr. Gerda Savage thinks are related to stress. She is not able to sleep well and is constantly wakes up through the night. She has had some panic attacks particularly during arguments with dad. She denies being suicidal or engaging in any form of self-harm. She does not use alcohol drugs cigarettes or vaping. She is never been sexually active. She and her mother are very close and she is also close to her brother. She seems extremely managed in the family, trying to protect her brother from the father and also trying to make up for her mother's chronic headaches by being a "second mom"to brother who picks him up at school etc  The patient returns for follow-up after 6 weeks.  Last time we increase the Lexapro  to 40 mg daily and it seems to have helped somewhat.  She is still stressed and trying to get along with her father who she states often makes derogatory comments and almost ruin Christmas by giving people gifts that were things they did not want.  However she is excited about her final semester of college.  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   She denies serious depression anxiety attacks or difficulty sleeping. Visit Diagnosis:    ICD-10-CM   1. Generalized anxiety disorder  F41.1   2. Depression, major, single episode, moderate (HCC)  F32.1     Past Psychiatric History: none  Past Medical History:  Past Medical History:  Diagnosis Date  . Allergy    Chronic  . Angio-edema   . Anxiety   . Asthma   . Beta thalassemia (HCC)   . Beta thalassemia minor   . Eczema   . IBS (irritable bowel syndrome)   . Mental disorder   . Reflux   . Urticaria     Past Surgical History:  Procedure Laterality Date  . CYST REMOVAL HAND    . KNEE SURGERY Left 03/16/2019  . WISDOM TOOTH EXTRACTION  2016    Family Psychiatric History: see below  Family History:  Family History  Problem Relation Age of Onset  . Anxiety disorder Mother   . Depression Mother   . Depression Father   . Anxiety disorder Brother   . Depression Brother   . Depression Paternal Aunt   . Bipolar disorder Other     Social History:  Social History   Socioeconomic History  . Marital status: Single    Spouse name: Not on file  . Number of children: Not on file  . Years of education: Not on file  . Highest education level: Not on file  Occupational History  . Not on file  Tobacco Use  . Smoking status: Never Smoker  . Smokeless tobacco: Never Used  Vaping Use  . Vaping Use: Never used  Substance and Sexual Activity  . Alcohol use: No  . Drug use: No  . Sexual activity: Never    Birth control/protection: Pill  Other Topics Concern  . Not on file  Social History Narrative  . Not on file   Social Determinants of Health   Financial Resource Strain: Low Risk   . Difficulty of Paying Living Expenses: Not hard at all  Food Insecurity: No Food Insecurity  . Worried About Programme researcher, broadcasting/film/video in the Last Year: Never true  . Ran Out of Food in the Last Year: Never true  Transportation Needs: No Transportation Needs  . Lack of Transportation  (Medical): No  . Lack of Transportation (Non-Medical): No  Physical Activity: Insufficiently Active  . Days of Exercise per Week: 2 days  . Minutes of Exercise per Session: 20 min  Stress: Stress Concern Present  . Feeling of Stress : Very much  Social Connections: Moderately Integrated  . Frequency of Communication with Friends and Family: Once a week  . Frequency of Social Gatherings with Friends and Family: More than three times a week  . Attends Religious Services: More than 4 times per year  . Active Member of Clubs or Organizations: Yes  . Attends Banker Meetings: 1 to 4 times per year  . Marital Status: Never married    Allergies:  Allergies  Allergen Reactions  . Amoxil [Amoxicillin] Nausea And Vomiting    Dizziness, light headed, loopy  . Cefzil [Cefprozil] Nausea And Vomiting    Vomiting (as an baby)  . Orange Oil Hives    Metabolic Disorder Labs: No results found for: HGBA1C, MPG No results found for: PROLACTIN No results found for: CHOL, TRIG, HDL, CHOLHDL, VLDL, LDLCALC Lab Results  Component Value Date   TSH 1.490 07/14/2019    Therapeutic Level Labs: No results found for: LITHIUM No results found for: VALPROATE No components found for:  CBMZ  Current Medications: Current Outpatient Medications  Medication Sig Dispense Refill  . albuterol (PROVENTIL HFA;VENTOLIN HFA) 108 (90 Base) MCG/ACT inhaler Inhale 2 puffs into the lungs every 6 (six) hours as needed for wheezing. 1 Inhaler 2  . escitalopram (LEXAPRO) 20 MG tablet Take 2 tablets (40 mg total) by mouth daily. 180 tablet 3  . hydrOXYzine (ATARAX/VISTARIL) 25 MG tablet Take 1 tablet (25 mg total) by mouth daily as needed for anxiety. 90 tablet 2  . LO LOESTRIN FE 1 MG-10 MCG / 10 MCG tablet TAKE 1 TABLET BY MOUTH  DAILY 84 tablet 3  . Multiple Vitamin (MULTIVITAMIN) tablet Take 1 tablet by mouth at bedtime.     . ondansetron (ZOFRAN ODT) 4 MG disintegrating tablet Take 1 tablet (4 mg  total) by mouth every 6 (six) hours as needed for nausea or vomiting. 20 tablet 0  . sodium fluoride (PREVIDENT) 1.1 % GEL dental gel PreviDent 5000 Booster Plus 1.1 % dental paste  APPLY WITH TOOHBRUSH HS UTD    . SUMAtriptan (IMITREX) 50 MG tablet May repeat in 2 hours if headache persists or recurs one time. 30 tablet 0   No current facility-administered medications for this visit.     Musculoskeletal: Strength & Muscle Tone: within normal limits Gait & Station: normal Patient leans: N/A  Psychiatric Specialty Exam: Review of Systems  All other systems reviewed and are negative.   There were no vitals taken for this visit.There is no height or weight on file to calculate BMI.  General Appearance: Casual and Fairly Groomed  Eye Contact:  Good  Speech:  Clear and Coherent  Volume:  Normal  Mood:  Euthymic  Affect:  Appropriate and Congruent  Thought Process:  Goal Directed  Orientation:  Full (Time, Place, and Person)  Thought Content: WDL   Suicidal Thoughts:  No  Homicidal Thoughts:  No  Memory:  Immediate;   Good Recent;   Good Remote;   Good  Judgement:  Good  Insight:  Good  Psychomotor Activity:  Normal  Concentration:  Concentration: Good and Attention Span: Good  Recall:  Good  Fund of Knowledge: Good  Language: Good  Akathisia:  No  Handed:  Right  AIMS (if indicated): not done  Assets:  Communication Skills Desire for Improvement Physical Health Resilience Social Support Talents/Skills  ADL's:  Intact  Cognition: WNL  Sleep:  Good   Screenings: GAD-7   Flowsheet Row Office Visit from 02/17/2020 in [facility] Office Visit from [facility]/11/2019 in [facility] Office Visit from 10/16/2017 in [facility]  Total GAD-7 Score 17 13 16     PHQ2-9   Flowsheet Row Office Visit from 02/17/2020 in [facility] Office Visit from 01/17/2020 in [facility] Office Visit from 08/11/2019 in [facility] Office Visit from 11/05/2018 in [facility]  PHQ-2 Total Score 3 3 1 1   PHQ-9 Total Score 17  16 15 13        Assessment and Plan: This patient is a 19 year old female with a history of depression and anxiety.  She seems to be under a lot of stress due to family dysfunction but is handling it a little bit better.  She will continue Lexapro 40 mg daily for depression and anxiety and hydroxyzine 25 mg at bedtime as needed for sleep.  She will return to see me in 3 months   36, MD 04/03/2020, 11:49 AM

## 2020-07-02 ENCOUNTER — Telehealth (INDEPENDENT_AMBULATORY_CARE_PROVIDER_SITE_OTHER): Payer: No Typology Code available for payment source | Admitting: Psychiatry

## 2020-07-02 ENCOUNTER — Encounter (HOSPITAL_COMMUNITY): Payer: Self-pay | Admitting: Psychiatry

## 2020-07-02 ENCOUNTER — Other Ambulatory Visit: Payer: Self-pay

## 2020-07-02 DIAGNOSIS — F411 Generalized anxiety disorder: Secondary | ICD-10-CM | POA: Diagnosis not present

## 2020-07-02 DIAGNOSIS — F321 Major depressive disorder, single episode, moderate: Secondary | ICD-10-CM | POA: Diagnosis not present

## 2020-07-02 MED ORDER — HYDROXYZINE HCL 25 MG PO TABS: 50.0000 mg | ORAL_TABLET | Freq: Every evening | ORAL | 2 refills | Status: DC | PRN

## 2020-07-02 MED ORDER — ESCITALOPRAM OXALATE 20 MG PO TABS
40.0000 mg | ORAL_TABLET | Freq: Every day | ORAL | 3 refills | Status: DC
Start: 2020-07-02 — End: 2020-08-23

## 2020-07-02 NOTE — Progress Notes (Signed)
Virtual Visit via Video Note  I connected with Renee Savage on 07/02/20 at 11:20 AM EDT by a video enabled telemedicine application and verified that I am speaking with the correct person using two identifiers.  Location: Patient: home Provider: home   I discussed the limitations of evaluation and management by telemedicine and the availability of in person appointments. The patient expressed understanding and agreed to proceed.    I discussed the assessment and treatment plan with the patient. The patient was provided an opportunity to ask questions and all were answered. The patient agreed with the plan and demonstrated an understanding of the instructions.   The patient was advised to call back or seek an in-person evaluation if the symptoms worsen or if the condition fails to improve as anticipated.  I provided 15 minutes of non-face-to-face time during this encounter.   [name], MD  [name]/PA/NP OP Progress Note  07/02/2020 11:42 AM Renee Savage  MRN:  [id number]  Chief Complaint:  Chief Complaint    Anxiety; Depression; Follow-up     HPI: This patient is a20 year old single white female who lives at home with her parents and 72 year old brother. 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  The patient was referred by her primary physician, [name] for further assessment and treatment of anxiety.  The patient states that she has been anxious for a number of years. Some of this started in middle school because she was being bullied. She also has a very contentious relationship with her father. She states that he tries to control everyone in the family but particularly her. They are always butting heads. She states that he comes from a "military family "and tends to be harsh. He is very critical of her appearance, her relationships, her major in school. He is also constantly criticizing little things like the timing of when she pays her tuition,  how she does things around the house etc. By her report he is very harsh and judgmental. He pushed her constantly to go into the same feel that he is-speech therapy even though she does not want to. He is very against her living on campus because of the cost.  The patient states over the last few months she has been extremely irritable, crying at the drop of a hat anxious and worried. She tends to obsess about things and expect the worst. She is very self-critical. She was dating a boy last fall and he broke up with her in December and this caused some of the symptoms to intensify. She has never had previous psychiatric treatment because her father does not believe in mental health treatment. She does have a lot of GI issues such as stomachaches and diarrhea which Dr. [name] thinks are related to stress. She is not able to sleep well and is constantly wakes up through the night. She has had some panic attacks particularly during arguments with dad. She denies being suicidal or engaging in any form of self-harm. She does not use alcohol drugs cigarettes or vaping. She is never been sexually active. She and her mother are very close and she is also close to her brother. She seems extremely managed in the family, trying to protect her brother from the father and also trying to make up for her mother's chronic headaches by being a "second mom"to brother who picks him up at school etc  The patient returns for follow-up after 3 months.  She states that she is  stressed because she is coming towards the end of her senior year in college.  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  She is enjoying it all but it is all a lot of the work.  She is having more trouble sleeping so we will increase her hydroxyzine to 50 mg.  She denies serious depression or anxiety or thoughts of self-harm or suicidal ideation Visit Diagnosis:     ICD-10-CM   1. Generalized anxiety disorder  F41.1   2. Depression, major, single episode, moderate (HCC)  F32.1     Past Psychiatric History: none  Past Medical History:  Past Medical History:  Diagnosis Date  . Allergy    Chronic  . Angio-edema   . Anxiety   . Asthma   . Beta thalassemia (HCC)   . Beta thalassemia minor   . Eczema   . IBS (irritable bowel syndrome)   . Mental disorder   . Reflux   . Urticaria     Past Surgical History:  Procedure Laterality Date  . CYST REMOVAL HAND    . KNEE SURGERY Left 03/16/2019  . WISDOM TOOTH EXTRACTION  2016    Family Psychiatric History: see below  Family History:  Family History  Problem Relation Age of Onset  . Anxiety disorder Mother   . Depression Mother   . Depression Father   . Anxiety disorder Brother   . Depression Brother   . Depression Paternal Aunt   . Bipolar disorder Other     Social History:  Social History   Socioeconomic History  . Marital status: Single    Spouse name: Not on file  . Number of children: Not on file  . Years of education: Not on file  . Highest education level: Not on file  Occupational History  . Not on file  Tobacco Use  . Smoking status: Never Smoker  . Smokeless tobacco: Never Used  Vaping Use  . Vaping Use: Never used  Substance and Sexual Activity  . Alcohol use: No  . Drug use: No  . Sexual activity: Never    Birth control/protection: Pill  Other Topics Concern  . Not on file  Social History Narrative  . Not on file   Social Determinants of Health   Financial Resource Strain: Low Risk   . Difficulty of Paying Living Expenses: Not hard at all  Food Insecurity: No Food Insecurity  . Worried About Programme researcher, broadcasting/film/video in the Last Year: Never true  . Ran Out of Food in the Last Year: Never true  Transportation Needs: No Transportation Needs  . Lack of Transportation (Medical): No  . Lack of Transportation (Non-Medical): No  Physical Activity:  Insufficiently Active  . Days of Exercise per Week: 2 days  . Minutes of Exercise per Session: 20 min  Stress: Stress Concern Present  . Feeling of Stress : Very much  Social Connections: Moderately Integrated  . Frequency of Communication with Friends and Family: Once a week  . Frequency of Social Gatherings with Friends and Family: More than three times a week  . Attends Religious Services: More than 4 times per year  . Active Member of Clubs or Organizations: Yes  . Attends Banker Meetings: 1 to 4 times per year  . Marital Status: Never married    Allergies:  Allergies  Allergen Reactions  . Amoxil [Amoxicillin] Nausea And Vomiting  Dizziness, light headed, loopy  . Cefzil [Cefprozil] Nausea And Vomiting    Vomiting (as an baby)  . Orange Oil Hives    Metabolic Disorder Labs: No results found for: HGBA1C, MPG No results found for: PROLACTIN No results found for: CHOL, TRIG, HDL, CHOLHDL, VLDL, LDLCALC Lab Results  Component Value Date   TSH 1.490 07/14/2019    Therapeutic Level Labs: No results found for: LITHIUM No results found for: VALPROATE No components found for:  CBMZ  Current Medications: Current Outpatient Medications  Medication Sig Dispense Refill  . albuterol (PROVENTIL HFA;VENTOLIN HFA) 108 (90 Base) MCG/ACT inhaler Inhale 2 puffs into the lungs every 6 (six) hours as needed for wheezing. 1 Inhaler 2  . escitalopram (LEXAPRO) 20 MG tablet Take 2 tablets (40 mg total) by mouth daily. 180 tablet 3  . hydrOXYzine (ATARAX/VISTARIL) 25 MG tablet Take 2 tablets (50 mg total) by mouth at bedtime as needed for anxiety. 180 tablet 2  . LO LOESTRIN FE 1 MG-10 MCG / 10 MCG tablet TAKE 1 TABLET BY MOUTH  DAILY 84 tablet 3  . Multiple Vitamin (MULTIVITAMIN) tablet Take 1 tablet by mouth at bedtime.     . ondansetron (ZOFRAN ODT) 4 MG disintegrating tablet Take 1 tablet (4 mg total) by mouth every 6 (six) hours as needed for nausea or vomiting. 20  tablet 0  . sodium fluoride (PREVIDENT) 1.1 % GEL dental gel PreviDent 5000 Booster Plus 1.1 % dental paste  APPLY WITH TOOHBRUSH HS UTD    . SUMAtriptan (IMITREX) 50 MG tablet May repeat in 2 hours if headache persists or recurs one time. 30 tablet 0   No current facility-administered medications for this visit.     Musculoskeletal: Strength & Muscle Tone: within normal limits Gait & Station: normal Patient leans: N/A  Psychiatric Specialty Exam: Review of Systems  Psychiatric/Behavioral: Positive for sleep disturbance. The patient is nervous/anxious.   All other systems reviewed and are negative.   There were no vitals taken for this visit.There is no height or weight on file to calculate BMI.  General Appearance: Casual and Fairly Groomed  Eye Contact:  Good  Speech:  Clear and Coherent  Volume:  Normal  Mood:  Anxious and Euthymic  Affect:  Appropriate and Congruent  Thought Process:  Goal Directed  Orientation:  Full (Time, Place, and Person)  Thought Content: Rumination   Suicidal Thoughts:  No  Homicidal Thoughts:  No  Memory:  Immediate;   Good Recent;   Good Remote;   Fair  Judgement:  Good  Insight:  Good  Psychomotor Activity:  Normal  Concentration:  Concentration: Good and Attention Span: Good  Recall:  Good  Fund of Knowledge: Good  Language: Good  Akathisia:  No  Handed:  Right  AIMS (if indicated): not done  Assets:  Communication Skills Desire for Improvement Physical Health Resilience Social Support Talents/Skills  ADL's:  Intact  Cognition: WNL  Sleep:  Good   Screenings: GAD-7   Flowsheet Row [facility]2020 [facility]  Total GAD-7 Score 17 13 16     PHQ2-9   Flowsheet Row [facility]2020 [facility]2018 [facility]  PHQ-2 Total Score 0 3 3 1 1   PHQ-9 Total Score - 17 16 15 13     Flowsheet Row Video Visit [facility] 07/02/2020 [facility]  C-SSRS RISK CATEGORY No Risk       Assessment and Plan: Patient is a 20 year old female with a history of depression and anxiety.  She is still having some anxiety and difficulty sleeping so we will increase hydroxyzine to 50 mg at bedtime as needed.  She will continue Lexapro 40 mg daily for depression and anxiety.  She will return to see me in 3 months   07/04/2020, MD 07/02/2020, 11:42 AM

## 2020-08-16 ENCOUNTER — Other Ambulatory Visit: Payer: Self-pay

## 2020-08-16 ENCOUNTER — Ambulatory Visit (INDEPENDENT_AMBULATORY_CARE_PROVIDER_SITE_OTHER): Payer: No Typology Code available for payment source | Admitting: Family Medicine

## 2020-08-16 ENCOUNTER — Encounter: Payer: Self-pay | Admitting: Family Medicine

## 2020-08-16 VITALS — BP 122/72 | HR 108 | Temp 97.7°F | Wt 153.2 lb

## 2020-08-16 DIAGNOSIS — G43909 Migraine, unspecified, not intractable, without status migrainosus: Secondary | ICD-10-CM | POA: Diagnosis not present

## 2020-08-16 NOTE — Patient Instructions (Signed)
Sumatriptan tablets What is this medicine? SUMATRIPTAN (soo ma TRIP tan) is used to treat migraines with or without aura. An aura is a strange feeling or visual disturbance that warns you of an attack. It is not used to prevent migraines. This medicine may be used for other purposes; ask your health care provider or pharmacist if you have questions. COMMON BRAND NAME(S): Imitrex, Migraine Pack What should I tell my health care provider before I take this medicine? They need to know if you have any of these conditions:  cigarette smoker  circulation problems in fingers and toes  diabetes  heart disease  high blood pressure  high cholesterol  history of irregular heartbeat  history of stroke  kidney disease  liver disease  stomach or intestine problems  an unusual or allergic reaction to sumatriptan, other medicines, foods, dyes, or preservatives  pregnant or trying to get pregnant  breast-feeding How should I use this medicine? Take this medicine by mouth with a glass of water. Follow the directions on the prescription label. Do not take it more often than directed. Talk to your pediatrician regarding the use of this medicine in children. Special care may be needed. Overdosage: If you think you have taken too much of this medicine contact a poison control center or emergency room at once. NOTE: This medicine is only for you. Do not share this medicine with others. What if I miss a dose? This does not apply. This medicine is not for regular use. What may interact with this medicine? Do not take this medicine with any of the following medicines:  certain medicines for migraine headache like almotriptan, eletriptan, frovatriptan, naratriptan, rizatriptan, sumatriptan, zolmitriptan  ergot alkaloids like dihydroergotamine, ergonovine, ergotamine, methylergonovine  MAOIs like Carbex, Eldepryl, Marplan, Nardil, and Parnate This medicine may also interact with the following  medications:  certain medicines for depression, anxiety, or psychotic disorders This list may not describe all possible interactions. Give your health care provider a list of all the medicines, herbs, non-prescription drugs, or dietary supplements you use. Also tell them if you smoke, drink alcohol, or use illegal drugs. Some items may interact with your medicine. What should I watch for while using this medicine? Visit your healthcare professional for regular checks on your progress. Tell your healthcare professional if your symptoms do not start to get better or if they get worse. You may get drowsy or dizzy. Do not drive, use machinery, or do anything that needs mental alertness until you know how this medicine affects you. Do not stand up or sit up quickly, especially if you are an older patient. This reduces the risk of dizzy or fainting spells. Alcohol may interfere with the effect of this medicine. Tell your healthcare professional right away if you have any change in your eyesight. If you take migraine medicines for 10 or more days a month, your migraines may get worse. Keep a diary of headache days and medicine use. Contact your healthcare professional if your migraine attacks occur more frequently. What side effects may I notice from receiving this medicine? Side effects that you should report to your doctor or health care professional as soon as possible:  allergic reactions like skin rash, itching or hives, swelling of the face, lips, or tongue  changes in vision  chest pain or chest tightness  signs and symptoms of a dangerous change in heartbeat or heart rhythm like chest pain; dizziness; fast, irregular heartbeat; palpitations; feeling faint or lightheaded; falls; breathing problems  signs  and symptoms of a stroke like changes in vision; confusion; trouble speaking or understanding; severe headaches; sudden numbness or weakness of the face, arm or leg; trouble walking; dizziness;  loss of balance or coordination  signs and symptoms of serotonin syndrome like irritable; confusion; diarrhea; fast or irregular heartbeat; muscle twitching; stiff muscles; trouble walking; sweating; high fever; seizures; chills; vomiting Side effects that usually do not require medical attention (report to your doctor or health care professional if they continue or are bothersome):  diarrhea  dizziness  drowsiness  dry mouth  headache  nausea, vomiting  pain, tingling, numbness in the hands or feet  stomach pain This list may not describe all possible side effects. Call your doctor for medical advice about side effects. You may report side effects to FDA at 1-800-FDA-1088. Where should I keep my medicine? Keep out of the reach of children. Store at room temperature between 2 and 30 degrees C (36 and 86 degrees F). Throw away any unused medicine after the expiration date. NOTE: This sheet is a summary. It may not cover all possible information. If you have questions about this medicine, talk to your doctor, pharmacist, or health care provider.  2021 Elsevier/Gold Standard (2017-10-06 15:05:37)

## 2020-08-16 NOTE — Progress Notes (Signed)
Patient ID: Renee Savage, female    DOB: [birth year], 20 y.o.   MRN: [id number]   Chief Complaint  Patient presents with  . Migraine   Subjective:  CC: medication management for migraines  This is not a new problem. Presents today for medication management for migraines. Reports she has had 2 headaches since October. Only used 4 sumatriptan tablets since October. Recently graduated college and has had a good schedule change.  Thinks this schedule change as well as college graduation is contributing to less headaches.  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  She is actively looking for a job.  Pt here for follow up on migraine. Med has helped to stop bad migraines. Has not had to use med much. Pt using Sumatriptan 50 mg.  Pt recently graduated college!!    Medical History Blair has a past medical history of Allergy, Angio-edema, Anxiety, Asthma, Beta thalassemia (HCC), Beta thalassemia minor, Eczema, IBS (irritable bowel syndrome), Mental disorder, Reflux, and Urticaria.   Outpatient Encounter Medications as of 08/16/2020  Medication Sig  . albuterol (PROVENTIL HFA;VENTOLIN HFA) 108 (90 Base) MCG/ACT inhaler Inhale 2 puffs into the lungs every 6 (six) hours as needed for wheezing.  Marland Kitchen escitalopram (LEXAPRO) 20 MG tablet Take 2 tablets (40 mg total) by mouth daily.  . hydrOXYzine (ATARAX/VISTARIL) 25 MG tablet Take 2 tablets (50 mg total) by mouth at bedtime as needed for anxiety.  . LO LOESTRIN FE 1 MG-10 MCG / 10 MCG tablet TAKE 1 TABLET BY MOUTH  DAILY  . Multiple Vitamin (MULTIVITAMIN) tablet Take 1 tablet by mouth at bedtime.  . ondansetron (ZOFRAN ODT) 4 MG disintegrating tablet Take 1 tablet (4 mg total) by mouth every 6 (six) hours as needed for nausea or vomiting.  . SUMAtriptan (IMITREX) 50 MG tablet May repeat in 2 hours if headache persists or recurs one time.  . [DISCONTINUED] sodium fluoride (PREVIDENT) 1.1 % GEL dental gel PreviDent 5000 Booster Plus 1.1 % dental paste  APPLY WITH  TOOHBRUSH HS UTD   No facility-administered encounter medications on file as of 08/16/2020.     Review of Systems  Constitutional: Negative for chills and fever.  Respiratory: Negative for shortness of breath.   Cardiovascular: Negative for chest pain.  Gastrointestinal: Negative for abdominal pain.  Neurological: Positive for headaches (2 migraines since October).     Vitals BP 122/72   Pulse (!) 108   Temp 97.7 F (36.5 C)   Wt 153 lb 3.2 oz (69.5 kg)   SpO2 98%   BMI 28.95 kg/m   Objective:   Physical Exam Vitals reviewed.  Constitutional:      Appearance: Normal appearance.  Cardiovascular:     Rate and Rhythm: Normal rate and regular rhythm.     Heart sounds: Normal heart sounds.  Pulmonary:     Effort: Pulmonary effort is normal.     Breath sounds: Normal breath sounds.  Skin:    General: Skin is warm and dry.  Neurological:     General: No focal deficit present.     Mental Status: She is alert.  Psychiatric:        Behavior: Behavior normal.      Assessment and Plan   1. Migraine without status migrainosus, not intractable, unspecified migraine type   Migraines well controlled currently. Has only taken 4 sumatriptan tablets since October. No refills needed.   Agrees with plan of care discussed today. Understands warning signs to seek further care: chest pain, shortness of  breath, any significant change in health.  Understands to follow-up in 3 months for medication management for migraines, sooner if anything changes.   Novella Olive, NP 08/16/2020

## 2020-08-23 ENCOUNTER — Encounter (HOSPITAL_COMMUNITY): Payer: Self-pay | Admitting: Psychiatry

## 2020-08-23 ENCOUNTER — Other Ambulatory Visit: Payer: Self-pay

## 2020-08-23 ENCOUNTER — Telehealth (INDEPENDENT_AMBULATORY_CARE_PROVIDER_SITE_OTHER): Payer: No Typology Code available for payment source | Admitting: Psychiatry

## 2020-08-23 DIAGNOSIS — F411 Generalized anxiety disorder: Secondary | ICD-10-CM | POA: Diagnosis not present

## 2020-08-23 DIAGNOSIS — F5081 Binge eating disorder: Secondary | ICD-10-CM | POA: Diagnosis not present

## 2020-08-23 DIAGNOSIS — F321 Major depressive disorder, single episode, moderate: Secondary | ICD-10-CM

## 2020-08-23 MED ORDER — ESCITALOPRAM OXALATE 20 MG PO TABS
40.0000 mg | ORAL_TABLET | Freq: Every day | ORAL | 3 refills | Status: DC
Start: 2020-08-23 — End: 2020-11-14

## 2020-08-23 MED ORDER — HYDROXYZINE HCL 25 MG PO TABS: 50.0000 mg | ORAL_TABLET | Freq: Every evening | ORAL | 2 refills | Status: DC | PRN

## 2020-08-23 MED ORDER — LISDEXAMFETAMINE DIMESYLATE 30 MG PO CAPS: 30.0000 mg | ORAL_CAPSULE | ORAL | 0 refills | Status: DC

## 2020-08-23 NOTE — Progress Notes (Signed)
Virtual Visit via Video Note  I connected with Renee Savage on 08/23/20 at  2:00 PM EDT by a video enabled telemedicine application and verified that I am speaking with the correct person using two identifiers.  Location: Patient: home Provider: office   I discussed the limitations of evaluation and management by telemedicine and the availability of in person appointments. The patient expressed understanding and agreed to proceed.:    I discussed the assessment and treatment plan with the patient. The patient was provided an opportunity to ask questions and all were answered. The patient agreed with the plan and demonstrated an understanding of the instructions.   The patient was advised to call back or seek an in-person evaluation if the symptoms worsen or if the condition fails to improve as anticipated.  I provided 15 minutes of non-face-to-face time during this encounter.   [name], MD  [facility] MD/PA/NP OP Progress Note  08/23/2020 2:34 PM Renee Savage  MRN:  [id number]  Chief Complaint:  Chief Complaint    Depression; Anxiety; Follow-up     HPI: This patient is a20 year old single white female who lives at home with her parents and 18 year old brother. She just graduated from [employer] this month with a degree in [place] and creative writing  The patient returns for follow-up after 3 months.  She is not been doing that well lately.  She graduated college about 2 weeks ago but she has not gotten a job.  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  She states that her father is very adamant that she find work as soon as possible.  She does not just want to do a manual job but wants to do something with her writing degree.  Its been hard to find anything.  She also has very upset about her weight.  She claims that she binge eats on junk food all the time to relieve stress.  She is upset and  tearful about this.  She does not feel significantly depressed or suicidal.  I suggested that we get her on Vyvanse to help with the binge eating and she agrees. Visit Diagnosis:    ICD-10-CM   1. Depression, major, single episode, moderate (HCC)  F32.1   2. Generalized anxiety disorder  F41.1   3. Binge eating disorder  F50.81     Past Psychiatric History: none  Past Medical History:  Past Medical History:  Diagnosis Date  . Allergy    Chronic  . Angio-edema   . Anxiety   . Asthma   . Beta thalassemia (HCC)   . Beta thalassemia minor   . Eczema   . IBS (irritable bowel syndrome)   . Mental disorder   . Reflux   . Urticaria     Past Surgical History:  Procedure Laterality Date  . CYST REMOVAL HAND    . KNEE SURGERY Left 03/16/2019  . WISDOM TOOTH EXTRACTION  2016    Family Psychiatric History: see below  Family History:  Family History  Problem Relation Age of Onset  . Anxiety disorder Mother   . Depression Mother   . Depression Father   . Anxiety disorder Brother   . Depression Brother   . Depression Paternal Aunt   . Bipolar disorder Other     Social History:  Social History   Socioeconomic History  . Marital status: Single    Spouse name:  Not on file  . Number of children: Not on file  . Years of education: Not on file  . Highest education level: Not on file  Occupational History  . Not on file  Tobacco Use  . Smoking status: Never Smoker  . Smokeless tobacco: Never Used  Vaping Use  . Vaping Use: Never used  Substance and Sexual Activity  . Alcohol use: No  . Drug use: No  . Sexual activity: Never    Birth control/protection: Pill  Other Topics Concern  . Not on file  Social History Narrative  . Not on file   Social Determinants of Health   Financial Resource Strain: Not on file  Food Insecurity: Not on file  Transportation Needs: Not on file  Physical Activity: Not on file  Stress: Not on file  Social Connections: Not on file     Allergies:  Allergies  Allergen Reactions  . Amoxil [Amoxicillin] Nausea And Vomiting    Dizziness, light headed, loopy  . Cefzil [Cefprozil] Nausea And Vomiting    Vomiting (as an baby)  . Orange Oil Hives    Metabolic Disorder Labs: No results found for: HGBA1C, MPG No results found for: PROLACTIN No results found for: CHOL, TRIG, HDL, CHOLHDL, VLDL, LDLCALC Lab Results  Component Value Date   TSH 1.490 07/14/2019    Therapeutic Level Labs: No results found for: LITHIUM No results found for: VALPROATE No components found for:  CBMZ  Current Medications: Current Outpatient Medications  Medication Sig Dispense Refill  . lisdexamfetamine (VYVANSE) 30 MG capsule Take 1 capsule (30 mg total) by mouth every morning. 30 capsule 0  . albuterol (PROVENTIL HFA;VENTOLIN HFA) 108 (90 Base) MCG/ACT inhaler Inhale 2 puffs into the lungs every 6 (six) hours as needed for wheezing. 1 Inhaler 2  . escitalopram (LEXAPRO) 20 MG tablet Take 2 tablets (40 mg total) by mouth daily. 180 tablet 3  . hydrOXYzine (ATARAX/VISTARIL) 25 MG tablet Take 2 tablets (50 mg total) by mouth at bedtime as needed for anxiety. 180 tablet 2  . LO LOESTRIN FE 1 MG-10 MCG / 10 MCG tablet TAKE 1 TABLET BY MOUTH  DAILY 84 tablet 3  . Multiple Vitamin (MULTIVITAMIN) tablet Take 1 tablet by mouth at bedtime.    . ondansetron (ZOFRAN ODT) 4 MG disintegrating tablet Take 1 tablet (4 mg total) by mouth every 6 (six) hours as needed for nausea or vomiting. 20 tablet 0  . SUMAtriptan (IMITREX) 50 MG tablet May repeat in 2 hours if headache persists or recurs one time. 30 tablet 0   No current facility-administered medications for this visit.     Musculoskeletal: Strength & Muscle Tone: within normal limits Gait & Station: normal Patient leans: N/A  Psychiatric Specialty Exam: Review of Systems  Constitutional: Positive for appetite change and unexpected weight change.  Psychiatric/Behavioral: The patient is  nervous/anxious.   All other systems reviewed and are negative.   There were no vitals taken for this visit.There is no height or weight on file to calculate BMI.  General Appearance: Casual and Fairly Groomed  Eye Contact:  Good  Speech:  Clear and Coherent  Volume:  Normal  Mood:  Anxious and Dysphoric  Affect:  Tearful  Thought Process:  Goal Directed  Orientation:  Full (Time, Place, and Person)  Thought Content: Rumination   Suicidal Thoughts:  No  Homicidal Thoughts:  No  Memory:  Immediate;   Good Recent;   Good Remote;   Good  Judgement:  Good  Insight:  Fair  Psychomotor Activity:  Decreased  Concentration:  Concentration: Fair and Attention Span: Fair  Recall:  Good  Fund of Knowledge: Good  Language: Good  Akathisia:  No  Handed:  Right  AIMS (if indicated): not done  Assets:  Communication Skills Desire for Improvement Physical Health Resilience Social Support Talents/Skills Vocational/Educational  ADL's:  Intact  Cognition: WNL  Sleep:  Fair   Screenings: GAD-7   Flowsheet Row Office Visit from 02/17/2020 in [facility] Office Visit from 08/11/2019 in [facility] Office Visit from 10/16/2017 in [facility]  Total GAD-7 Score 17 13 16     PHQ2-9   Flowsheet Row Video [facility]/23[facility]2020 [facility] Video Visit from 07/02/2020 in [facility] [facility] Office Visit from 02/17/2020 in [facility] Office Visit from 01/17/2020 in [facility] Office Visit from 08/11/2019 in [facility]  PHQ-2 Total Score 1 0 3 3 1   PHQ-9 Total Score -- -- 17 16 15     Flowsheet Row Video Visit [facility] 08/23/2020 [facility] Video Visit from 07[facility]02/2020 [facility]  C-SSRS RISK CATEGORY No Risk No Risk       Assessment and Plan: This  patient is a 20 year old female with a history of depression and anxiety.  She is now admits that she has been binge eating for the last several months.  Because of this I will add Vyvanse 30 mg every morning to her regimen.  She will continue Lexapro 40 mg daily for depression and hydroxyzine 50 mg at bedtime as needed for sleep and anxiety.  She will return to see me in 3 weeks   08/25/2020, MD 08/23/2020, 2:34 PM

## 2020-08-24 ENCOUNTER — Telehealth: Payer: Self-pay | Admitting: Family Medicine

## 2020-08-24 DIAGNOSIS — Z130 Encounter for screening for diseases of the blood and blood-forming organs and certain disorders involving the immune mechanism: Secondary | ICD-10-CM

## 2020-08-24 DIAGNOSIS — Z131 Encounter for screening for diabetes mellitus: Secondary | ICD-10-CM

## 2020-08-24 DIAGNOSIS — Z13220 Encounter for screening for lipoid disorders: Secondary | ICD-10-CM

## 2020-08-24 NOTE — Telephone Encounter (Signed)
Patient has physical 6/8 and needing labs

## 2020-08-24 NOTE — Telephone Encounter (Signed)
Last labs completed 07/20/19 Ferritin, Hep B, Hep C, Iron and TIBC. Also had labs TSH, Free T4, BMET, Hepatic and CBC completed on 07/14/19. Please advise. Thank you

## 2020-08-27 ENCOUNTER — Other Ambulatory Visit: Payer: Self-pay

## 2020-08-27 DIAGNOSIS — R109 Unspecified abdominal pain: Secondary | ICD-10-CM

## 2020-08-27 DIAGNOSIS — F339 Major depressive disorder, recurrent, unspecified: Secondary | ICD-10-CM

## 2020-08-27 DIAGNOSIS — R5383 Other fatigue: Secondary | ICD-10-CM

## 2020-08-27 DIAGNOSIS — G8929 Other chronic pain: Secondary | ICD-10-CM

## 2020-08-27 DIAGNOSIS — G43909 Migraine, unspecified, not intractable, without status migrainosus: Secondary | ICD-10-CM

## 2020-08-27 NOTE — Telephone Encounter (Signed)
Pls order cbc, cmp, lipids. Thx. Dr. Karie Schwalbe

## 2020-08-27 NOTE — Telephone Encounter (Signed)
Patient informed labs have been ordered prior to physical appt.

## 2020-09-12 ENCOUNTER — Encounter: Payer: Self-pay | Admitting: Family Medicine

## 2020-09-12 ENCOUNTER — Other Ambulatory Visit: Payer: Self-pay

## 2020-09-12 ENCOUNTER — Ambulatory Visit (INDEPENDENT_AMBULATORY_CARE_PROVIDER_SITE_OTHER): Payer: No Typology Code available for payment source | Admitting: Family Medicine

## 2020-09-12 VITALS — HR 97 | Temp 99.0°F | Ht 61.0 in | Wt 146.0 lb

## 2020-09-12 DIAGNOSIS — J029 Acute pharyngitis, unspecified: Secondary | ICD-10-CM

## 2020-09-12 DIAGNOSIS — J0100 Acute maxillary sinusitis, unspecified: Secondary | ICD-10-CM

## 2020-09-12 LAB — POCT RAPID STREP A (OFFICE): Rapid Strep A Screen: NEGATIVE

## 2020-09-12 MED ORDER — AZITHROMYCIN 250 MG PO TABS: ORAL_TABLET | ORAL | 0 refills | Status: DC

## 2020-09-12 NOTE — Progress Notes (Signed)
Patient ID: Renee Savage, female    DOB: 12-Aug-2000, 20 y.o.   MRN: [id number]   Chief Complaint  Patient presents with   URI   Subjective:    HPIcongestion, headache, scratchy throat, stomach pain and nausea. Started one week ago. Has not tried any meds. Took home covid test that was negative.   8 days ago started with sore throat and fever.  Then over next few days developed headache, fatigue, nausea and stomach pain. No coughing.  But feeling some yellow-green drainage down the throat.  No other sick contacts at home. Had home covid testing-negative.  Medical History Twinkle has a past medical history of Allergy, Angio-edema, Anxiety, Asthma, Beta thalassemia (HCC), Beta thalassemia minor, Eczema, IBS (irritable bowel syndrome), Mental disorder, Reflux, and Urticaria.   Outpatient Encounter Medications as of 09/12/2020  Medication Sig   albuterol (PROVENTIL HFA;VENTOLIN HFA) 108 (90 Base) MCG/ACT inhaler Inhale 2 puffs into the lungs every 6 (six) hours as needed for wheezing.   azithromycin (ZITHROMAX Z-PAK) 250 MG tablet Take 2 tab p.o. day 1, then take 1 tab daily for 4 more days.   escitalopram (LEXAPRO) 20 MG tablet Take 2 tablets (40 mg total) by mouth daily.   hydrOXYzine (ATARAX/VISTARIL) 25 MG tablet Take 2 tablets (50 mg total) by mouth at bedtime as needed for anxiety.   LO LOESTRIN FE 1 MG-10 MCG / 10 MCG tablet TAKE 1 TABLET BY MOUTH  DAILY   Multiple Vitamin (MULTIVITAMIN) tablet Take 1 tablet by mouth at bedtime.   ondansetron (ZOFRAN ODT) 4 MG disintegrating tablet Take 1 tablet (4 mg total) by mouth every 6 (six) hours as needed for nausea or vomiting.   SUMAtriptan (IMITREX) 50 MG tablet May repeat in 2 hours if headache persists or recurs one time.   lisdexamfetamine (VYVANSE) 30 MG capsule Take 1 capsule (30 mg total) by mouth every morning. (Patient not taking: Reported on 09/12/2020)   No facility-administered encounter medications on file as of 09/12/2020.      Review of Systems  Constitutional:  Positive for fever. Negative for chills.  HENT:  Positive for congestion and sore throat. Negative for ear pain, rhinorrhea, sinus pressure and sinus pain.   Eyes:  Negative for pain, discharge and itching.  Respiratory:  Negative for cough and shortness of breath.   Gastrointestinal:  Positive for abdominal pain and nausea. Negative for constipation, diarrhea and vomiting.  Neurological:  Positive for headaches.    Vitals Pulse 97   Temp 99 F (37.2 C) (Temporal)   Ht 5\' 1"  (1.549 m)   Wt 146 lb (66.2 kg)   SpO2 97%   BMI 27.59 kg/m   Objective:   Physical Exam Vitals and nursing note reviewed.  Constitutional:      General: She is not in acute distress.    Appearance: Normal appearance. She is not ill-appearing or toxic-appearing.  HENT:     Head: Normocephalic and atraumatic.     Right Ear: Tympanic membrane, ear canal and external ear normal.     Left Ear: Tympanic membrane, ear canal and external ear normal.     Nose: Congestion present. No rhinorrhea.     Comments: +ttp over maxillary sinuses.    Mouth/Throat:     Mouth: Mucous membranes are moist.     Pharynx: Oropharynx is clear. Posterior oropharyngeal erythema present. No oropharyngeal exudate.  Eyes:     Extraocular Movements: Extraocular movements intact.     Conjunctiva/sclera: Conjunctivae normal.  Pupils: Pupils are equal, round, and reactive to light.  Cardiovascular:     Rate and Rhythm: Normal rate and regular rhythm.     Pulses: Normal pulses.     Heart sounds: Normal heart sounds.  Pulmonary:     Effort: Pulmonary effort is normal. No respiratory distress.     Breath sounds: Normal breath sounds. No wheezing, rhonchi or rales.  Musculoskeletal:     Cervical back: Normal range of motion.  Lymphadenopathy:     Cervical: No cervical adenopathy.  Skin:    General: Skin is warm and dry.     Findings: No rash.  Neurological:     Mental Status: She is  alert and oriented to person, place, and time.  Psychiatric:        Mood and Affect: Mood normal.        Behavior: Behavior normal.     Assessment and Plan   1. Sore throat - POCT rapid strep A  2. Acute non-recurrent maxillary sinusitis - azithromycin (ZITHROMAX Z-PAK) 250 MG tablet; Take 2 tab p.o. day 1, then take 1 tab daily for 4 more days.  Dispense: 6 each; Refill: 0   Allergies to pcn and cefzil.  Will give azithromycin.  Negative home covid testing 2x.   Rapid strep is negative.  Sinusitis- azithromycin, given since pcn allergy.   No follow-ups on file.  09/23/2020

## 2020-09-13 ENCOUNTER — Telehealth (HOSPITAL_COMMUNITY): Payer: No Typology Code available for payment source | Admitting: Psychiatry

## 2020-09-21 LAB — COMPREHENSIVE METABOLIC PANEL
ALT: 25 IU/L (ref 0–32)
AST: 17 IU/L (ref 0–40)
Albumin/Globulin Ratio: 2.1 (ref 1.2–2.2)
Albumin: 4.6 g/dL (ref 3.9–5.0)
Alkaline Phosphatase: 78 IU/L (ref 44–121)
BUN/Creatinine Ratio: 14 (ref 9–23)
BUN: 11 mg/dL (ref 6–20)
Bilirubin Total: <0.2 mg/dL (ref 0.0–1.2)
CO2: 21 mmol/L (ref 20–29)
Calcium: 9.5 mg/dL (ref 8.7–10.2)
Chloride: 104 mmol/L (ref 96–106)
Creatinine, Ser: 0.77 mg/dL (ref 0.57–1.00)
Globulin, Total: 2.2 g/dL (ref 1.5–4.5)
Glucose: 86 mg/dL (ref 65–99)
Potassium: 4.1 mmol/L (ref 3.5–5.2)
Sodium: 141 mmol/L (ref 134–144)
Total Protein: 6.8 g/dL (ref 6.0–8.5)
eGFR: 112 mL/min/{1.73_m2} (ref >59)

## 2020-09-21 LAB — CBC WITH DIFFERENTIAL/PLATELET
Basophils Absolute: 0 10*3/uL (ref 0.0–0.2)
Basos: 1 %
EOS (ABSOLUTE): 0.2 10*3/uL (ref 0.0–0.4)
Eos: 2 %
Hematocrit: 39.5 % (ref 34.0–46.6)
Hemoglobin: 12.7 g/dL (ref 11.1–15.9)
Immature Grans (Abs): 0 10*3/uL (ref 0.0–0.1)
Immature Granulocytes: 0 %
Lymphocytes Absolute: 3.1 10*3/uL (ref 0.7–3.1)
Lymphs: 47 %
MCH: 28.3 pg (ref 26.6–33.0)
MCHC: 32.2 g/dL (ref 31.5–35.7)
MCV: 88 fL (ref 79–97)
Monocytes Absolute: 0.3 10*3/uL (ref 0.1–0.9)
Monocytes: 4 %
Neutrophils Absolute: 3.1 10*3/uL (ref 1.4–7.0)
Neutrophils: 46 %
Platelets: 180 10*3/uL (ref 150–450)
RBC: 4.48 x10E6/uL (ref 3.77–5.28)
RDW: 13.5 % (ref 11.7–15.4)
WBC: 6.8 10*3/uL (ref 3.4–10.8)

## 2020-09-21 LAB — LIPID PANEL
Chol/HDL Ratio: 4 ratio (ref 0.0–4.4)
Cholesterol, Total: 193 mg/dL (ref 100–199)
HDL: 48 mg/dL (ref >39)
LDL Chol Calc (NIH): 113 mg/dL — ABNORMAL HIGH (ref 0–99)
Triglycerides: 185 mg/dL — ABNORMAL HIGH (ref 0–149)
VLDL Cholesterol Cal: 32 mg/dL (ref 5–40)

## 2020-09-23 ENCOUNTER — Encounter: Payer: Self-pay | Admitting: Family Medicine

## 2020-09-24 ENCOUNTER — Ambulatory Visit (INDEPENDENT_AMBULATORY_CARE_PROVIDER_SITE_OTHER): Payer: No Typology Code available for payment source | Admitting: Family Medicine

## 2020-09-24 ENCOUNTER — Other Ambulatory Visit: Payer: Self-pay

## 2020-09-24 VITALS — BP 103/71 | HR 75 | Temp 97.3°F | Ht 61.0 in | Wt 145.0 lb

## 2020-09-24 DIAGNOSIS — Z0001 Encounter for general adult medical examination with abnormal findings: Secondary | ICD-10-CM | POA: Diagnosis not present

## 2020-09-24 DIAGNOSIS — D563 Thalassemia minor: Secondary | ICD-10-CM

## 2020-09-24 DIAGNOSIS — Z79899 Other long term (current) drug therapy: Secondary | ICD-10-CM

## 2020-09-24 DIAGNOSIS — Z713 Dietary counseling and surveillance: Secondary | ICD-10-CM | POA: Diagnosis not present

## 2020-09-24 DIAGNOSIS — Z0000 Encounter for general adult medical examination without abnormal findings: Secondary | ICD-10-CM

## 2020-09-24 NOTE — Progress Notes (Signed)
Patient ID: Renee Savage, female    DOB: Jan 18, 2001, 20 y.o.   MRN: [id number]   Chief Complaint  Patient presents with   Annual Exam    Had complete physical with PAP at GYN 5/21   Subjective:    HPI A review of their health history was completed.  A review of medications was also completed.  Any needed refills; none  Eating habits: trying to eat better  Falls/  MVA accidents in past few months: none  Regular exercise: trying to do better  Specialist pt sees on regular basis: psychiatrist -Dr Tenny Craw  Preventative health issues were discussed.   Additional concerns: discuss a medication her psychiatrist mentioned to her   Pt stating seen by gyn on 5/21 for pap. Psychiatrist wanting to start medication for binge eating and adhd.  And wondering about bcp and taking it for regulating periods.  No family history of blood clotting disorders.  Pt stating has beta thal minor.  No personal history of cancer. Maternal Grandfather- has esophageal cancer.  Pt working on working on [employer].  Having rt knee and had surgery. Hurting and trying to run. Was weighing in 160s now at 145lbs. Working on [employer] for comfort.  Medical History Renee Savage has a past medical history of Allergy, Angio-edema, Anxiety, Asthma, Beta thalassemia (HCC), Beta thalassemia minor, Eczema, IBS (irritable bowel syndrome), Mental disorder, Migraines, Reflux, and Urticaria.   Outpatient Encounter Medications as of 09/24/2020  Medication Sig   albuterol (PROVENTIL HFA;VENTOLIN HFA) 108 (90 Base) MCG/ACT inhaler Inhale 2 puffs into the lungs every 6 (six) hours as needed for wheezing.   escitalopram (LEXAPRO) 20 MG tablet Take 2 tablets (40 mg total) by mouth daily.   hydrOXYzine (ATARAX/VISTARIL) 25 MG tablet Take 2 tablets (50 mg total) by mouth at bedtime as needed for anxiety.   lisdexamfetamine (VYVANSE) 30 MG capsule Take 1 capsule (30 mg total) by mouth every morning. (Patient not taking: No  sig reported)   Multiple Vitamin (MULTIVITAMIN) tablet Take 1 tablet by mouth at bedtime.   ondansetron (ZOFRAN ODT) 4 MG disintegrating tablet Take 1 tablet (4 mg total) by mouth every 6 (six) hours as needed for nausea or vomiting.   SUMAtriptan (IMITREX) 50 MG tablet May repeat in 2 hours if headache persists or recurs one time.   [DISCONTINUED] azithromycin (ZITHROMAX Z-PAK) 250 MG tablet Take 2 tab p.o. day 1, then take 1 tab daily for 4 more days.   [DISCONTINUED] LO LOESTRIN FE 1 MG-10 MCG / 10 MCG tablet TAKE 1 TABLET BY MOUTH  DAILY   No facility-administered encounter medications on file as of 09/24/2020.   The patient comes in today for a wellness visit.    Review of Systems  Constitutional:  Negative for chills and fever.  HENT:  Negative for congestion, rhinorrhea and sore throat.   Respiratory:  Negative for cough, shortness of breath and wheezing.   Cardiovascular:  Negative for chest pain and leg swelling.  Gastrointestinal:  Negative for abdominal pain, diarrhea, nausea and vomiting.  Genitourinary:  Negative for dysuria and frequency.  Musculoskeletal:  Negative for arthralgias and back pain.  Skin:  Negative for rash.  Neurological:  Negative for dizziness, weakness and headaches.    Vitals BP 103/71   Pulse 75   Temp (!) 97.3 F (36.3 C) (Oral)   Ht 5\' 1"  (1.549 m)   Wt 145 lb (65.8 kg)   SpO2 98%   BMI 27.40 kg/m   Objective:  Physical Exam Vitals and nursing note reviewed.  Constitutional:      General: She is not in acute distress.    Appearance: Normal appearance. She is not ill-appearing.  HENT:     Head: Normocephalic and atraumatic.     Right Ear: Tympanic membrane, ear canal and external ear normal.     Left Ear: Tympanic membrane, ear canal and external ear normal.     Nose: Nose normal. No congestion.     Mouth/Throat:     Mouth: Mucous membranes are moist.     Pharynx: Oropharynx is clear. No oropharyngeal exudate or posterior  oropharyngeal erythema.  Eyes:     Extraocular Movements: Extraocular movements intact.     Conjunctiva/sclera: Conjunctivae normal.     Pupils: Pupils are equal, round, and reactive to light.  Cardiovascular:     Rate and Rhythm: Normal rate and regular rhythm.     Pulses: Normal pulses.     Heart sounds: Normal heart sounds.  Pulmonary:     Effort: Pulmonary effort is normal.     Breath sounds: Normal breath sounds. No wheezing, rhonchi or rales.  Abdominal:     General: Abdomen is flat. Bowel sounds are normal. There is no distension.     Palpations: Abdomen is soft. There is no mass.     Tenderness: There is no abdominal tenderness. There is no guarding or rebound.     Hernia: No hernia is present.  Musculoskeletal:        General: Normal range of motion.     Cervical back: Normal range of motion.     Right lower leg: No edema.     Left lower leg: No edema.  Skin:    General: Skin is warm and dry.     Findings: No lesion or rash.  Neurological:     General: No focal deficit present.     Mental Status: She is alert and oriented to person, place, and time.     Cranial Nerves: No cranial nerve deficit.  Psychiatric:        Mood and Affect: Mood normal.        Behavior: Behavior normal.     Assessment and Plan   1. Wellness examination - EKG 12-Lead  2. Beta thalassemia minor  3. Encounter for weight loss counseling - EKG 12-Lead  4. High risk medication use - EKG 12-Lead   Pt seeing gyn for well woman exam. Had pap in 5/21.  Pt requesting ekg to see if her heart is normal and if she's okay to take vyvanse per her psychiatrist for binge eating disorder. Ekg- normal sinus rhythm.   Pt advised she is safe to take the medication and then follow up in 2 wks after starting meds for recheck of EKG.  Pt voiced understanding.  Return in about 1 year (around 09/24/2021) for f/u wellness exam.

## 2020-10-01 ENCOUNTER — Other Ambulatory Visit: Payer: Self-pay

## 2020-10-01 ENCOUNTER — Encounter: Payer: Self-pay | Admitting: Adult Health

## 2020-10-01 ENCOUNTER — Ambulatory Visit (INDEPENDENT_AMBULATORY_CARE_PROVIDER_SITE_OTHER): Payer: PRIVATE HEALTH INSURANCE | Admitting: Adult Health

## 2020-10-01 VITALS — BP 105/75 | HR 76 | Ht 61.0 in | Wt 142.5 lb

## 2020-10-01 DIAGNOSIS — Z7689 Persons encountering health services in other specified circumstances: Secondary | ICD-10-CM | POA: Insufficient documentation

## 2020-10-01 DIAGNOSIS — Z01419 Encounter for gynecological examination (general) (routine) without abnormal findings: Secondary | ICD-10-CM

## 2020-10-01 MED ORDER — LO LOESTRIN FE 1 MG-10 MCG / 10 MCG PO TABS: 1.0000 | ORAL_TABLET | Freq: Every day | ORAL | 3 refills | Status: DC

## 2020-10-01 NOTE — Progress Notes (Signed)
Patient ID: Renee Savage, female   DOB: 01/10/2001, 20 y.o.   MRN: [id number] History of Present Illness: Renee Savage is a 20 year old white female,single, G0P0, in for a well woman gyn exam and refill OCs.  PCP is Dr Ladona Ridgel.  Lab Results  Component Value Date   DIAGPAP  08/11/2019    - Negative for intraepithelial lesion or malignancy (NILM)   HPVHIGH Negative 08/11/2019     Current Medications, Allergies, Past Medical History, Past Surgical History, Family History and Social History were reviewed in [name].     Review of Systems: Patient denies any headaches, hearing loss, fatigue, blurred vision, shortness of breath, chest pain, abdominal pain, problems with bowel movements, urination, or intercourse.(Has never had sex) No joint pain or mood swings.  Right knee hurts at times, knee cap pops No period on OCs. Has stress at home Has graduated from college, with English degree. Has gained weight and then lost some, and breasts bigger, from C cup to DD.     Physical Exam:BP 105/75 (BP Location: Left Arm, Patient Position: Sitting, Cuff Size: Normal)   Pulse 76   Ht 5\' 1"  (1.549 m)   Wt 142 lb 8 oz (64.6 kg)   LMP  (LMP Unknown)   BMI 26.93 kg/m   General:  Well developed, well nourished, no acute distress Skin:  Warm and dry Neck:  Midline trachea, normal thyroid, good ROM, no lymphadenopathy Lungs; Clear to auscultation bilaterally Breast:  No dominant palpable mass, retraction, or nipple discharge Cardiovascular: Regular rate and rhythm Abdomen:  Soft, non tender, no hepatosplenomegaly Pelvic:  External genitalia is normal in appearance, no lesions.  The vagina is normal in appearance. Urethra has no lesions or masses. The cervix is nulliparous.  Uterus is felt to be normal size, shape, and contour.  No adnexal masses or tenderness noted.Bladder is non tender, no masses felt. Extremities/musculoskeletal:  No swelling or varicosities noted,  no clubbing or cyanosis Psych:  No mood changes, alert and cooperative,seems happy AA is 0 Fall risk is low Depression screen [facility] 2/9 10/01/2020 09/24/2020 02/17/2020  Decreased Interest 0 0 2  Down, Depressed, Hopeless 1 1 1   PHQ - 2 Score 1 1 3   Altered sleeping 3 3 3   Tired, decreased energy 2 2 3   Change in appetite 3 3 3   Feeling bad or failure about yourself  1 2 1   Trouble concentrating 2 1 2   Moving slowly or fidgety/restless 1 0 1  Suicidal thoughts 0 0 1  PHQ-9 Score 13 12 17   Difficult doing work/chores - Somewhat difficult Somewhat difficult  Some encounter information is confidential and restricted. Go to Review Flowsheets activity to see all data.    GAD 7 : Generalized Anxiety Score 10/01/2020 02/17/2020 08/11/2019 10/16/2017  Nervous, Anxious, on Edge 3 3 2 3   Control/stop worrying 2 1 2 2   Worry too much - different things 2 2 1 3   Trouble relaxing 1 3 2 1   Restless 1 2 2 2   Easily annoyed or irritable 3 3 3 3   Afraid - awful might happen 1 3 1 2   Total GAD 7 Score 13 17 13 16   Anxiety Difficulty - Very difficult Somewhat difficult Somewhat difficult  On Lexapro and has not started Vyvanse Follow up with Dr .    Upstream - 10/01/20 1436       Pregnancy Intention Screening   Does the patient want to become pregnant in the next year?  No    Does the patient's partner want to become pregnant in the next year? No    Would the patient like to discuss contraceptive options today? No      Contraception Wrap Up   Current Method Oral Contraceptive   abstinence   End Method Oral Contraceptive;Abstinence    Contraception Counseling Provided No             Examination chaperoned by Faith Rogue LPN  Impression and Plan:  1. Encounter for well woman exam with routine gynecological exam Physical in 1 year Pap in 2023   2. Encounter for menstrual regulation Will refill OCs Meds ordered this encounter  Medications   Norethindrone-Ethinyl Estradiol-Fe  Biphas (LO LOESTRIN FE) 1 MG-10 MCG / 10 MCG tablet    Sig: Take 1 tablet by mouth daily.    Dispense:  84 tablet    Refill:  3    Requesting 1 year supply    Order Specific Question:   Supervising Provider    Answer:   Duane Lope H [[id number]]     Has never had sex, so no GC/CHL performed.

## 2020-10-29 ENCOUNTER — Telehealth (HOSPITAL_COMMUNITY): Payer: No Typology Code available for payment source | Admitting: Psychiatry

## 2020-11-14 ENCOUNTER — Encounter (HOSPITAL_COMMUNITY): Payer: Self-pay | Admitting: Psychiatry

## 2020-11-14 ENCOUNTER — Other Ambulatory Visit: Payer: Self-pay

## 2020-11-14 ENCOUNTER — Telehealth (INDEPENDENT_AMBULATORY_CARE_PROVIDER_SITE_OTHER): Payer: No Typology Code available for payment source | Admitting: Psychiatry

## 2020-11-14 DIAGNOSIS — F321 Major depressive disorder, single episode, moderate: Secondary | ICD-10-CM | POA: Diagnosis not present

## 2020-11-14 DIAGNOSIS — F411 Generalized anxiety disorder: Secondary | ICD-10-CM | POA: Diagnosis not present

## 2020-11-14 MED ORDER — HYDROXYZINE HCL 25 MG PO TABS: 50.0000 mg | ORAL_TABLET | Freq: Every evening | ORAL | 2 refills | Status: AC | PRN

## 2020-11-14 MED ORDER — ESCITALOPRAM OXALATE 20 MG PO TABS: 40.0000 mg | ORAL_TABLET | Freq: Every day | ORAL | 3 refills | Status: AC

## 2020-11-14 NOTE — Progress Notes (Signed)
Virtual Visit via Video Note  I connected with Renee Savage on 11/14/20 at  2:00 PM EDT by a video enabled telemedicine application and verified that I am speaking with the correct person using two identifiers.  Location: Patient: home Provider: home office   I discussed the limitations of evaluation and management by telemedicine and the availability of in person appointments. The patient     I discussed the assessment and treatment plan with the patient. The patient was provided an opportunity to ask questions and all were answered. The patient agreed with the plan and demonstrated an understanding of the instructions.   The patient was advised to call back or seek an in-person evaluation if the symptoms worsen or if the condition fails to improve as anticipated.  I provided 15 minutes of non-face-to-face time during this encounter.   [name], MD  [facility] MD/PA/NP OP Progress Note  11/14/2020 2:24 PM Renee Savage  MRN:  [id number]  Chief Complaint:  Chief Complaint   Anxiety; Depression; Follow-up    HPI: This patient is a 20 year old single white female who lives at home with her parents and 22 year old brother.  She just graduated from [employer] this month with a degree in [place] and creative writing  She returns for follow-up after 3 months.  Last time she was very concerned about her "binge eating" and weight gain.  We decided to try Vyvanse and she went to pick it up.  However she decided to try lifestyle changes.  She has totally changed the food that she is buying and having available to her.  She is trying to switch to more fruits and vegetables and sandwiches.  She is also exercising more.  To her credit she has lost 10 pounds.  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  This has been somewhat difficult.  Her father by her report, is very critical of this and wants her to have a full-time job such as a Diplomatic Services operational officer.  She really does not want  to do this so they are constantly at odds.  Nevertheless the patient denies significant depression anxiety insomnia or thoughts of self-harm or suicidal ideation.  She thinks her medications are still helpful Visit Diagnosis:    ICD-10-CM   1. Depression, major, single episode, moderate (HCC)  F32.1     2. Generalized anxiety disorder  F41.1       Past Psychiatric History: none  Past Medical History:  Past Medical History:  Diagnosis Date   Allergy    Chronic   Angio-edema    Anxiety    Asthma    Beta thalassemia (HCC)    Beta thalassemia minor    Eczema    IBS (irritable bowel syndrome)    Mental disorder    Migraines    Reflux    Urticaria     Past Surgical History:  Procedure Laterality Date   CYST REMOVAL HAND     KNEE SURGERY Left 03/16/2019   WISDOM TOOTH EXTRACTION  2016    Family Psychiatric History: see below  Family History:  Family History  Problem Relation Age of Onset   Anxiety disorder Mother    Depression Mother    Depression Father    Anxiety disorder Brother    Depression Brother    Depression Paternal Aunt    Bipolar disorder Other     Social History:  Social History   Socioeconomic History   Marital status: Single  Spouse name: Not on file   Number of children: Not on file   Years of education: Not on file   Highest education level: Not on file  Occupational History   Not on file  Tobacco Use   Smoking status: Never   Smokeless tobacco: Never  Vaping Use   Vaping Use: Never used  Substance and Sexual Activity   Alcohol use: No   Drug use: No   Sexual activity: Never    Birth control/protection: Pill  Other Topics Concern   Not on file  Social History Narrative   Not on file   Social Determinants of Health   Financial Resource Strain: Low Risk    Difficulty of Paying Living Expenses: Not very hard  Food Insecurity: No Food Insecurity   Worried About Running Out of Food in the Last Year: Never true   Ran Out of Food  in the Last Year: Never true  Transportation Needs: No Transportation Needs   Lack of Transportation (Medical): No   Lack of Transportation (Non-Medical): No  Physical Activity: Sufficiently Active   Days of Exercise per Week: 5 days   Minutes of Exercise per Session: 30 min  Stress: Unknown   Feeling of Stress : Patient refused  Social Connections: Unknown   Frequency of Communication with Friends and Family: Patient refused   Frequency of Social Gatherings with Friends and Family: Patient refused   Attends Religious Services: More than 4 times per year   Active Member of [employer] or Organizations: Yes   Attends Banker Meetings: Never   Marital Status: Never married    Allergies:  Allergies  Allergen Reactions   Amoxil [Amoxicillin] Nausea And Vomiting    Dizziness, light headed, loopy   Cefzil [Cefprozil] Nausea And Vomiting    Vomiting (as an baby)   Orange Oil Hives    Metabolic Disorder Labs: No results found for: HGBA1C, MPG No results found for: PROLACTIN Lab Results  Component Value Date   CHOL 193 09/20/2020   TRIG 185 (H) 09/20/2020   HDL 48 09/20/2020   CHOLHDL 4.0 09/20/2020   LDLCALC 113 (H) 09/20/2020   Lab Results  Component Value Date   TSH 1.490 07/14/2019    Therapeutic Level Labs: No results found for: LITHIUM No results found for: VALPROATE No components found for:  CBMZ  Current Medications: Current Outpatient Medications  Medication Sig Dispense Refill   albuterol (PROVENTIL HFA;VENTOLIN HFA) 108 (90 Base) MCG/ACT inhaler Inhale 2 puffs into the lungs every 6 (six) hours as needed for wheezing. 1 Inhaler 2   escitalopram (LEXAPRO) 20 MG tablet Take 2 tablets (40 mg total) by mouth daily. 180 tablet 3   hydrOXYzine (ATARAX/VISTARIL) 25 MG tablet Take 2 tablets (50 mg total) by mouth at bedtime as needed for anxiety. 180 tablet 2   Multiple Vitamin (MULTIVITAMIN) tablet Take 1 tablet by mouth at bedtime.     Norethindrone-Ethinyl  Estradiol-Fe Biphas (LO LOESTRIN FE) 1 MG-10 MCG / 10 MCG tablet Take 1 tablet by mouth daily. 84 tablet 3   ondansetron (ZOFRAN ODT) 4 MG disintegrating tablet Take 1 tablet (4 mg total) by mouth every 6 (six) hours as needed for nausea or vomiting. 20 tablet 0   SUMAtriptan (IMITREX) 50 MG tablet May repeat in 2 hours if headache persists or recurs one time. 30 tablet 0   No current facility-administered medications for this visit.     Musculoskeletal: Strength & Muscle Tone: within normal limits Gait & Station:  normal Patient leans: N/A  Psychiatric Specialty Exam: Review of Systems  All other systems reviewed and are negative.  There were no vitals taken for this visit.There is no height or weight on file to calculate BMI.  General Appearance: Casual and Fairly Groomed  Eye Contact:  Good  Speech:  Clear and Coherent  Volume:  Normal  Mood:  Euthymic  Affect:  Appropriate and Congruent  Thought Process:  Goal Directed  Orientation:  Full (Time, Place, and Person)  Thought Content: Rumination   Suicidal Thoughts:  No  Homicidal Thoughts:  No  Memory:  Immediate;   Good Recent;   Good Remote;   Good  Judgement:  Good  Insight:  Good  Psychomotor Activity:  Normal  Concentration:  Concentration: Good and Attention Span: Good  Recall:  Good  Fund of Knowledge: Good  Language: Good  Akathisia:  No  Handed:  Right  AIMS (if indicated): not done  Assets:  Communication Skills Desire for Improvement Physical Health Resilience Social Support Talents/Skills  ADL's:  Intact  Cognition: WNL  Sleep:  Good   Screenings: GAD-7    Flowsheet Row Office Visit from 10/01/2020 in [facility] Office Visit from 02/17/2020 in [facility] Office Visit from 08/11/2019 in [facility] Office Visit from 10/16/2017 in [facility]  Total GAD-7 Score 13 17 13 16       PHQ2-9    Flowsheet Row [facility]2020 [facility] Office Visit from 10/01/2020 in [facility] Office Visit from 09[facility]24/2020 in [facility] Video Visit from 08/23/2020 in [facility] Video Visit from 07/02/2020 in [facility]  PHQ-2 Total Score 1 1 1 1  0  PHQ-9 Total Score -- 13 12 -- --      Flowsheet Row Video Visit from 11/14/2020 in [facility] Video Visit from 08/23/2020 in [facility] Video Visit from 07/02/2020 in [facility]  C-SSRS RISK CATEGORY No Risk No Risk No Risk        Assessment and Plan: Patient is a 19 year old female with a history of depression and anxiety.  She is doing much better with her eating habits.  For this reason we will forego any stimulant medication for now.  She will continue Lexapro 40 mg daily for depression and hydroxyzine 50 mg at bedtime as needed for sleep and anxiety.  She will return to see me in 2 months   07/04/2020, MD 11/14/2020, 2:24 PM

## 2020-11-15 ENCOUNTER — Ambulatory Visit (INDEPENDENT_AMBULATORY_CARE_PROVIDER_SITE_OTHER): Payer: No Typology Code available for payment source | Admitting: Family Medicine

## 2020-11-15 ENCOUNTER — Other Ambulatory Visit: Payer: Self-pay

## 2020-11-15 DIAGNOSIS — G43009 Migraine without aura, not intractable, without status migrainosus: Secondary | ICD-10-CM

## 2020-11-15 MED ORDER — SUMATRIPTAN SUCCINATE 50 MG PO TABS: ORAL_TABLET | ORAL | 5 refills | Status: AC

## 2020-11-15 MED ORDER — ONDANSETRON 4 MG PO TBDP: 4.0000 mg | ORAL_TABLET | Freq: Four times a day (QID) | ORAL | 0 refills | Status: AC | PRN

## 2020-11-15 NOTE — Progress Notes (Signed)
Patient ID: Renee Savage, female    DOB: 2000/05/31, 20 y.o.   MRN: [id number]  Migraine- medication refills   Subjective:    HPI  Pt seen for f/u on migraines. Stress inc migraines and blue light with computers.  Has blue light lenses in glasses. Has light sensitivity, nausea, and needing to lay down in dark. No vomiting. Taking zofran prn with her migraines.  Had migraine last month- 2x times and took 2 tab each day. This month having more stress and having some diarrhea.   Seeing psychiatry for other mental health concerns.  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  Having a hard time getting starting. Feeling strain with family needs.  Medical History Zohar has a past medical history of Allergy, Angio-edema, Anxiety, Asthma, Beta thalassemia (HCC), Beta thalassemia minor, Eczema, IBS (irritable bowel syndrome), Mental disorder, Migraines, Reflux, and Urticaria.   Outpatient Encounter Medications as of 11/15/2020  Medication Sig   albuterol (PROVENTIL HFA;VENTOLIN HFA) 108 (90 Base) MCG/ACT inhaler Inhale 2 puffs into the lungs every 6 (six) hours as needed for wheezing.   escitalopram (LEXAPRO) 20 MG tablet Take 2 tablets (40 mg total) by mouth daily.   hydrOXYzine (ATARAX/VISTARIL) 25 MG tablet Take 2 tablets (50 mg total) by mouth at bedtime as needed for anxiety.   Multiple Vitamin (MULTIVITAMIN) tablet Take 1 tablet by mouth at bedtime.   Norethindrone-Ethinyl Estradiol-Fe Biphas (LO LOESTRIN FE) 1 MG-10 MCG / 10 MCG tablet Take 1 tablet by mouth daily.   [DISCONTINUED] ondansetron (ZOFRAN ODT) 4 MG disintegrating tablet Take 1 tablet (4 mg total) by mouth every 6 (six) hours as needed for nausea or vomiting.   [DISCONTINUED] SUMAtriptan (IMITREX) 50 MG tablet May repeat in 2 hours if headache persists or recurs one time.   ondansetron (ZOFRAN ODT) 4 MG disintegrating tablet Take 1 tablet (4 mg total) by mouth every 6 (six)  hours as needed for nausea or vomiting.   SUMAtriptan (IMITREX) 50 MG tablet Take 1 tab p.o. at onset of headache.  May repeat in 2 hours with 1 tab.  Max 2 tab in 24 hrs.   No facility-administered encounter medications on file as of 11/15/2020.     Review of Systems  Constitutional:  Negative for chills and fever.  HENT:  Negative for congestion, rhinorrhea and sore throat.   Respiratory:  Negative for cough, shortness of breath and wheezing.   Cardiovascular:  Negative for chest pain and leg swelling.  Gastrointestinal:  Negative for abdominal pain, diarrhea, nausea and vomiting.  Genitourinary:  Negative for dysuria and frequency.  Musculoskeletal:  Negative for arthralgias and back pain.  Skin:  Negative for rash.  Neurological:  Positive for headaches (intermittent). Negative for dizziness and weakness.    Vitals BP 105/71   Pulse 78   Temp 98 F (36.7 C)   Ht 5\' 1"  (1.549 m)   Wt 139 lb (63 kg)   SpO2 98%   BMI 26.26 kg/m   Objective:   Physical Exam Vitals and nursing note reviewed.  Constitutional:      General: She is not in acute distress.    Appearance: Normal appearance. She is not ill-appearing.  HENT:     Head: Normocephalic and atraumatic.     Nose: Nose normal.     Mouth/Throat:     Mouth: Mucous membranes are moist.     Pharynx: Oropharynx is clear.  Eyes:     Extraocular  Movements: Extraocular movements intact.     Conjunctiva/sclera: Conjunctivae normal.     Pupils: Pupils are equal, round, and reactive to light.  Cardiovascular:     Rate and Rhythm: Normal rate and regular rhythm.     Pulses: Normal pulses.     Heart sounds: Normal heart sounds.  Pulmonary:     Effort: Pulmonary effort is normal.     Breath sounds: Normal breath sounds. No wheezing, rhonchi or rales.  Musculoskeletal:        General: Normal range of motion.     Right lower leg: No edema.     Left lower leg: No edema.  Skin:    General: Skin is warm and dry.     Findings:  No lesion or rash.  Neurological:     General: No focal deficit present.     Mental Status: She is alert and oriented to person, place, and time.  Psychiatric:        Mood and Affect: Mood normal.        Behavior: Behavior normal.     Assessment and Plan   1. Migraine without aura and without status migrainosus, not intractable - SUMAtriptan (IMITREX) 50 MG tablet; Take 1 tab p.o. at onset of headache.  May repeat in 2 hours with 1 tab.  Max 2 tab in 24 hrs.  Dispense: 10 tablet; Refill: 5 - ondansetron (ZOFRAN ODT) 4 MG disintegrating tablet; Take 1 tablet (4 mg total) by mouth every 6 (six) hours as needed for nausea or vomiting.  Dispense: 20 tablet; Refill: 0   Pt doing well.   Migraines are controlled with medications.  Pt to cont to work on her stress levels.  Return in about 6 months (around 05/18/2021) for f/u migraines.

## 2021-02-25 ENCOUNTER — Other Ambulatory Visit: Payer: Self-pay

## 2021-02-25 MED ORDER — LO LOESTRIN FE 1 MG-10 MCG / 10 MCG PO TABS: 1.0000 | ORAL_TABLET | Freq: Every day | ORAL | 3 refills | Status: AC

## 2021-03-11 ENCOUNTER — Other Ambulatory Visit: Payer: Self-pay

## 2021-03-11 ENCOUNTER — Telehealth: Payer: Self-pay | Admitting: Radiology

## 2021-03-11 ENCOUNTER — Ambulatory Visit: Payer: No Typology Code available for payment source | Admitting: Orthopedic Surgery

## 2021-03-11 ENCOUNTER — Encounter: Payer: Self-pay | Admitting: Orthopedic Surgery

## 2021-03-11 VITALS — BP 117/81 | HR 110 | Ht 61.0 in | Wt 142.4 lb

## 2021-03-11 DIAGNOSIS — S83001S Unspecified subluxation of right patella, sequela: Secondary | ICD-10-CM | POA: Diagnosis not present

## 2021-03-11 DIAGNOSIS — M25362 Other instability, left knee: Secondary | ICD-10-CM

## 2021-03-11 DIAGNOSIS — S83002S Unspecified subluxation of left patella, sequela: Secondary | ICD-10-CM | POA: Diagnosis not present

## 2021-03-11 NOTE — Patient Instructions (Signed)
I will send your referral to [facility] orthopedics you can call them to schedule the number is [phone].  You will need to get records and operative reports from [facility] along with any xrays they have done the number there is [phone]

## 2021-03-11 NOTE — Progress Notes (Signed)
NEW PROBLEM//OFFICE VISIT   Chief Complaint  Patient presents with   Knee Pain    LT knee but RT knee is starting to hurt as well DOI 02/24/21 Pt states her kneecaps dislocate and has had sx on left knee to try and stabalize the knee Slipped Sunday before Thanksgiving and knee dislocated again Patellafemoral syndrome   20 year old female with ligament laxity status post patellofemoral ligament reconstruction with allograft left knee for patellofemoral subluxation 2 years ago was in reasonably good health in terms of her knee slipped Thanksgiving the left knee subluxated went back in place and since that time the right knee has been popping more and feels like it wants to give out  The patient is concerned because her left knee is preventing her from performing her normal activities of daily living  Since she has had her knee surgery she has not been able to exercise the way she wants she had to stop doing dance and theater  She is moving to [place] soon.   BP 117/81   Pulse (!) 110   Ht 5\' 1"  (1.549 m)   Wt 142 lb 6.4 oz (64.6 kg)   BMI 26.91 kg/m   Body mass index is 26.91 kg/m.  General appearance: Well-developed well-nourished no gross deformities  Cardiovascular normal pulse and perfusion normal color without edema  Neurologically no sensation loss or deficits or pathologic reflexes  Psychological: Awake alert and oriented x3 mood and affect normal  Skin no lacerations or ulcerations no nodularity no palpable masses, no erythema or nodularity  Musculoskeletal:  The patient has multiple joints which exhibit ligament laxity this includes the thumb test Hyperextension of the elbows  She has a medial femoral scar medial patellofemoral scar from the surgery on the left knee she has swelling over the tibial tubercle and some tenderness.  She does not appear to have joint effusion but may be some soft tissue swelling  The left patella definitely subluxate's laterally with  some apprehension her knee range of motion is normal and her other ligaments are stable  The right knee also has apprehension with lateral patellar subluxation although she feels it is going out medially she has full range of motion with normal ligaments no tenderness or swelling no effusion   Review of Systems  All other systems reviewed and are negative.   Past Medical History:  Diagnosis Date   Allergy    Chronic   Angio-edema    Anxiety    Asthma    Beta thalassemia (HCC)    Beta thalassemia minor    Eczema    IBS (irritable bowel syndrome)    Mental disorder    Migraines    Reflux    Urticaria     Past Surgical History:  Procedure Laterality Date   CYST REMOVAL HAND     KNEE SURGERY Left 03/16/2019   WISDOM TOOTH EXTRACTION  2016    Family History  Problem Relation Age of Onset   Anxiety disorder Mother    Depression Mother    Depression Father    Anxiety disorder Brother    Depression Brother    Depression Paternal Aunt    Bipolar disorder Other    Social History   Tobacco Use   Smoking status: Never   Smokeless tobacco: Never  Vaping Use   Vaping Use: Never used  Substance Use Topics   Alcohol use: No   Drug use: No    Allergies  Allergen Reactions   Amoxil [Amoxicillin]  Nausea And Vomiting    Dizziness, light headed, loopy   Cefzil [Cefprozil] Nausea And Vomiting    Vomiting (as an baby)   Orange Oil Hives    No outpatient medications have been marked as taking for the 03/11/21 encounter (Office Visit) with Vickki Hearing, MD.     MEDICAL DECISION MAKING  A.  Encounter Diagnoses  Name Primary?   Patellar subluxation, left, sequela Yes   Patellar subluxation, right, sequela     B. DATA ANALYSED:   IMAGING: Interpretation of images: I have personally reviewed the images and my interpretation is no imaging was done  Orders: None  Outside records reviewed: I attempted to look at records from Dr. Aundria Rud who did the left knee  surgery but was unable to find the operative note   C. MANAGEMENT   Recommend quadricep strengthening exercises  The patient should be evaluated by [facility] based orthopedist when she moves to [place] to determine what is the best treatment option at this time  She seemed to be functioning well with the left knee surgery until she slipped around Thanksgiving.  The right knee seems that it is more symptomatic since she had issues with the left and may be amenable to rehabilitation  In the meantime she has a circumferential patellar stabilizer brace on the right lateral stabilizer brace on the left  She is giving strengthening exercises for the quadriceps  No orders of the defined types were placed in this encounter.    Fuller Canada, MD  03/11/2021 12:04 PM

## 2021-03-11 NOTE — Telephone Encounter (Signed)
DR Coralee Rud referral to [facility] they will need records from [facility] including operative report Referral declined They will get records from [facility] And let us know about a referral at later date, as patient is moving to [facility]

## 2021-07-09 ENCOUNTER — Telehealth (HOSPITAL_COMMUNITY): Payer: Self-pay | Admitting: Psychiatry

## 2021-07-09 NOTE — Telephone Encounter (Signed)
Called to schedule f/u appt left vm 

## 2022-06-05 ENCOUNTER — Encounter: Payer: Self-pay | Admitting: Radiology
